# Patient Record
Sex: Female | Born: 1955 | Race: Black or African American | Hispanic: No | Marital: Single | Smoking: Never smoker
Health system: Southern US, Community
[De-identification: ages and names within clinical notes are randomized; demographics above are authoritative.]

## PROBLEM LIST (undated history)

## (undated) DIAGNOSIS — C50919 Malignant neoplasm of unspecified site of unspecified female breast: Secondary | ICD-10-CM

## (undated) DIAGNOSIS — E538 Deficiency of other specified B group vitamins: Secondary | ICD-10-CM

## (undated) DIAGNOSIS — G43909 Migraine, unspecified, not intractable, without status migrainosus: Secondary | ICD-10-CM

## (undated) DIAGNOSIS — Z923 Personal history of irradiation: Secondary | ICD-10-CM

## (undated) DIAGNOSIS — M1990 Unspecified osteoarthritis, unspecified site: Secondary | ICD-10-CM

## (undated) DIAGNOSIS — G629 Polyneuropathy, unspecified: Secondary | ICD-10-CM

## (undated) DIAGNOSIS — M549 Dorsalgia, unspecified: Secondary | ICD-10-CM

## (undated) DIAGNOSIS — M8580 Other specified disorders of bone density and structure, unspecified site: Secondary | ICD-10-CM

## (undated) DIAGNOSIS — B009 Herpesviral infection, unspecified: Secondary | ICD-10-CM

## (undated) DIAGNOSIS — R202 Paresthesia of skin: Secondary | ICD-10-CM

## (undated) DIAGNOSIS — K219 Gastro-esophageal reflux disease without esophagitis: Secondary | ICD-10-CM

## (undated) DIAGNOSIS — H409 Unspecified glaucoma: Secondary | ICD-10-CM

## (undated) DIAGNOSIS — L309 Dermatitis, unspecified: Secondary | ICD-10-CM

## (undated) DIAGNOSIS — G8929 Other chronic pain: Secondary | ICD-10-CM

## (undated) HISTORY — PX: CYSTOSCOPY: SUR368

## (undated) HISTORY — DX: Paresthesia of skin: R20.2

## (undated) HISTORY — PX: TUBAL LIGATION: SHX77

## (undated) HISTORY — DX: Migraine, unspecified, not intractable, without status migrainosus: G43.909

## (undated) HISTORY — PX: DILATION AND CURETTAGE OF UTERUS: SHX78

## (undated) HISTORY — DX: Deficiency of other specified B group vitamins: E53.8

## (undated) HISTORY — PX: ABDOMINAL HYSTERECTOMY: SHX81

## (undated) HISTORY — DX: Unspecified glaucoma: H40.9

## (undated) HISTORY — PX: SHOULDER ARTHROSCOPY WITH ROTATOR CUFF REPAIR: SHX5685

---
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
## 2008-08-09 HISTORY — PX: COLONOSCOPY: SHX174

## 2008-08-28 ENCOUNTER — Encounter: Admission: RE | Admit: 2008-08-28 | Discharge: 2008-11-26 | Payer: Self-pay | Admitting: Obstetrics and Gynecology

## 2010-07-27 ENCOUNTER — Emergency Department (HOSPITAL_BASED_OUTPATIENT_CLINIC_OR_DEPARTMENT_OTHER)
Admission: EM | Admit: 2010-07-27 | Discharge: 2010-07-27 | Payer: Self-pay | Source: Home / Self Care | Admitting: Emergency Medicine

## 2010-10-19 LAB — TSH: TSH: 3.396 u[IU]/mL (ref 0.350–4.500)

## 2010-10-21 ENCOUNTER — Ambulatory Visit: Payer: BC Managed Care – PPO | Attending: Obstetrics and Gynecology | Admitting: Physical Therapy

## 2010-10-21 DIAGNOSIS — M25559 Pain in unspecified hip: Secondary | ICD-10-CM | POA: Insufficient documentation

## 2010-10-21 DIAGNOSIS — M6281 Muscle weakness (generalized): Secondary | ICD-10-CM | POA: Insufficient documentation

## 2010-10-21 DIAGNOSIS — IMO0001 Reserved for inherently not codable concepts without codable children: Secondary | ICD-10-CM | POA: Insufficient documentation

## 2010-10-21 DIAGNOSIS — M545 Low back pain, unspecified: Secondary | ICD-10-CM | POA: Insufficient documentation

## 2010-10-21 DIAGNOSIS — R262 Difficulty in walking, not elsewhere classified: Secondary | ICD-10-CM | POA: Insufficient documentation

## 2010-10-21 DIAGNOSIS — R293 Abnormal posture: Secondary | ICD-10-CM | POA: Insufficient documentation

## 2010-10-23 ENCOUNTER — Ambulatory Visit: Payer: BC Managed Care – PPO | Admitting: Physical Therapy

## 2010-10-30 ENCOUNTER — Ambulatory Visit: Payer: BC Managed Care – PPO | Admitting: Physical Therapy

## 2010-11-24 ENCOUNTER — Other Ambulatory Visit: Payer: Self-pay | Admitting: Family Medicine

## 2010-11-24 DIAGNOSIS — R110 Nausea: Secondary | ICD-10-CM

## 2010-11-24 DIAGNOSIS — H53149 Visual discomfort, unspecified: Secondary | ICD-10-CM

## 2010-11-24 DIAGNOSIS — R51 Headache: Secondary | ICD-10-CM

## 2010-11-26 ENCOUNTER — Ambulatory Visit
Admission: RE | Admit: 2010-11-26 | Discharge: 2010-11-26 | Disposition: A | Discharge status: Home / Self Care | Payer: BC Managed Care – PPO | Source: Ambulatory Visit | Attending: Family Medicine | Admitting: Family Medicine

## 2010-11-26 DIAGNOSIS — R51 Headache: Secondary | ICD-10-CM

## 2010-11-26 DIAGNOSIS — R110 Nausea: Secondary | ICD-10-CM

## 2010-11-26 DIAGNOSIS — H53149 Visual discomfort, unspecified: Secondary | ICD-10-CM

## 2010-11-27 ENCOUNTER — Other Ambulatory Visit: Payer: Self-pay | Admitting: Family Medicine

## 2010-11-27 DIAGNOSIS — R110 Nausea: Secondary | ICD-10-CM

## 2010-11-27 DIAGNOSIS — R51 Headache: Secondary | ICD-10-CM

## 2010-12-02 ENCOUNTER — Ambulatory Visit
Admission: RE | Admit: 2010-12-02 | Discharge: 2010-12-02 | Disposition: A | Discharge status: Home / Self Care | Payer: BC Managed Care – PPO | Source: Ambulatory Visit | Attending: Family Medicine | Admitting: Family Medicine

## 2010-12-02 DIAGNOSIS — R110 Nausea: Secondary | ICD-10-CM

## 2010-12-02 DIAGNOSIS — R51 Headache: Secondary | ICD-10-CM

## 2011-04-14 ENCOUNTER — Ambulatory Visit: Payer: Self-pay | Admitting: Physical Therapy

## 2011-07-12 ENCOUNTER — Other Ambulatory Visit: Payer: Self-pay

## 2011-07-12 ENCOUNTER — Encounter (HOSPITAL_COMMUNITY)
Admission: RE | Admit: 2011-07-12 | Discharge: 2011-07-12 | Disposition: A | Discharge status: Home / Self Care | Payer: BC Managed Care – PPO | Source: Ambulatory Visit | Attending: Orthopedic Surgery | Admitting: Orthopedic Surgery

## 2011-07-12 ENCOUNTER — Ambulatory Visit (HOSPITAL_COMMUNITY)
Admission: RE | Admit: 2011-07-12 | Discharge: 2011-07-12 | Disposition: A | Discharge status: Home / Self Care | Payer: BC Managed Care – PPO | Source: Ambulatory Visit | Attending: Anesthesiology | Admitting: Anesthesiology

## 2011-07-12 ENCOUNTER — Encounter (HOSPITAL_COMMUNITY): Payer: Self-pay

## 2011-07-12 DIAGNOSIS — Z01812 Encounter for preprocedural laboratory examination: Secondary | ICD-10-CM | POA: Insufficient documentation

## 2011-07-12 DIAGNOSIS — Z01810 Encounter for preprocedural cardiovascular examination: Secondary | ICD-10-CM | POA: Insufficient documentation

## 2011-07-12 DIAGNOSIS — Z01818 Encounter for other preprocedural examination: Secondary | ICD-10-CM | POA: Insufficient documentation

## 2011-07-12 HISTORY — DX: Polyneuropathy, unspecified: G62.9

## 2011-07-12 HISTORY — DX: Dermatitis, unspecified: L30.9

## 2011-07-12 HISTORY — DX: Other chronic pain: G89.29

## 2011-07-12 HISTORY — DX: Other specified disorders of bone density and structure, unspecified site: M85.80

## 2011-07-12 HISTORY — DX: Dorsalgia, unspecified: M54.9

## 2011-07-12 HISTORY — DX: Gastro-esophageal reflux disease without esophagitis: K21.9

## 2011-07-12 HISTORY — DX: Unspecified osteoarthritis, unspecified site: M19.90

## 2011-07-12 LAB — BASIC METABOLIC PANEL
BUN: 13 mg/dL (ref 6–23)
CO2: 31 mEq/L (ref 19–32)
Calcium: 9.4 mg/dL (ref 8.4–10.5)
Chloride: 103 mEq/L (ref 96–112)
Creatinine, Ser: 0.67 mg/dL (ref 0.50–1.10)
GFR calc Af Amer: >90 mL/min (ref >90)
GFR calc non Af Amer: >90 mL/min (ref >90)
Glucose, Bld: 92 mg/dL (ref 70–99)
Potassium: 4 mEq/L (ref 3.5–5.1)
Sodium: 142 mEq/L (ref 135–145)

## 2011-07-12 LAB — DIFFERENTIAL
Basophils Relative: 1 % (ref 0–1)
Eosinophils Absolute: 0.5 10*3/uL (ref 0.0–0.7)
Eosinophils Relative: 5 % (ref 0–5)
Lymphs Abs: 3.2 10*3/uL (ref 0.7–4.0)
Monocytes Absolute: 0.6 10*3/uL (ref 0.1–1.0)
Monocytes Relative: 7 % (ref 3–12)

## 2011-07-12 LAB — CBC
HCT: 38.8 % (ref 36.0–46.0)
Hemoglobin: 13.2 g/dL (ref 12.0–15.0)
MCH: 32.6 pg (ref 26.0–34.0)
MCHC: 34 g/dL (ref 30.0–36.0)
MCV: 95.8 fL (ref 78.0–100.0)
RBC: 4.05 MIL/uL (ref 3.87–5.11)

## 2011-07-12 LAB — PROTIME-INR
INR: 0.96 (ref 0.00–1.49)
Prothrombin Time: 13 seconds (ref 11.6–15.2)

## 2011-07-12 LAB — APTT: aPTT: 33 seconds (ref 24–37)

## 2011-07-12 NOTE — Progress Notes (Signed)
Stress test done 3+yrs ago in [facility] for routine for medical MD  Medical MD is Dr.Novi at [facility]

## 2011-07-12 NOTE — Pre-Procedure Instructions (Signed)
20 Noela Brothers  07/12/2011   Your procedure is scheduled on:  Fri,Dec 14th @ 0730  Report to [facility] at 0530 AM.  Call this number if you have problems the morning of surgery: [phone]   Remember:   Do not eat food:After Midnight.  May have clear liquids: up to 4 Hours before arrival.  Clear liquids include soda, tea, black coffee, apple or grape juice, broth.  Take these medicines the morning of surgery with A SIP OF WATER: Acyclovir and Gabapentin   Do not wear jewelry, make-up or nail polish.  Do not wear lotions, powders, or perfumes. You may wear deodorant.  Do not shave 48 hours prior to surgery.  Do not bring valuables to the hospital.  Contacts, dentures or bridgework may not be worn into surgery.  Leave suitcase in the car. After surgery it may be brought to your room.  For patients admitted to the hospital, checkout time is 11:00 AM the day of discharge.   Patients discharged the day of surgery will not be allowed to drive home.  Name and phone number of your driver:   Special Instructions: CHG Shower Use Special Wash: 1/2 bottle night before surgery and 1/2 bottle morning of surgery.   Please read over the following fact sheets that you were given: Pain Booklet, Coughing and Deep Breathing, MRSA Information and Surgical Site Infection Prevention

## 2011-07-12 NOTE — Progress Notes (Signed)
Pt states no high blood pressure;she is on Lisinopril d/t being a diabetic and to guard against kidney disease

## 2011-07-13 NOTE — H&P (Signed)
55 y/o female with worsening right shoulder pain secondary to a rotator cuff tear and AC arthrosis. Pt has elected to have a rotator cuff repair and open distal clavicle excision by Dr. Ranell Patrick to decrease pain and increase function PMH: Hypertension, diabetes, GERD, peripheral neuropathy Surgical: hysterectomy Meds: metformin, gabapentin, ramipril, vitamin d, fish oil, flax seed  ROS: right shoulder pain and weakness otherwise negative Allergy: NKDA Family: Diabetes, hypertension Social: smoker, no alcohol,  PE: 5'3 141lb   108/78 Healthy appearing 55 year old female in no acute distress. Full rom of cervical spine with cranial nerves intact. Right shoulder with moderate guarding due to pain with limited rom, nv intact distally, strength of external rotation is 4/5. No rashes or edema MRI: rotator cuff tear of the right shoulder with AC arthrosis A/P: plan for surgery for rotator cuff repair and open distal clavicle excision

## 2011-07-22 MED ORDER — CEFAZOLIN SODIUM 1-5 GM-% IV SOLN
1.0000 g | INTRAVENOUS | Status: AC
  Administered 2011-07-23: 1 g via INTRAVENOUS
  Filled 2011-07-22: qty 50

## 2011-07-23 ENCOUNTER — Encounter (HOSPITAL_COMMUNITY): Payer: Self-pay | Admitting: *Deleted

## 2011-07-23 ENCOUNTER — Encounter (HOSPITAL_COMMUNITY): Payer: Self-pay | Admitting: Anesthesiology

## 2011-07-23 ENCOUNTER — Ambulatory Visit (HOSPITAL_COMMUNITY)
Admission: RE | Admit: 2011-07-23 | Discharge: 2011-07-24 | Disposition: A | Discharge status: Home / Self Care | Payer: BC Managed Care – PPO | Source: Ambulatory Visit | Attending: Orthopedic Surgery | Admitting: Orthopedic Surgery

## 2011-07-23 ENCOUNTER — Encounter (HOSPITAL_COMMUNITY)
Admission: RE | Disposition: A | Discharge status: Home / Self Care | Payer: Self-pay | Source: Ambulatory Visit | Attending: Orthopedic Surgery

## 2011-07-23 ENCOUNTER — Inpatient Hospital Stay (HOSPITAL_COMMUNITY): Payer: BC Managed Care – PPO | Admitting: Anesthesiology

## 2011-07-23 ENCOUNTER — Encounter (HOSPITAL_COMMUNITY): Payer: Self-pay | Admitting: Orthopedic Surgery

## 2011-07-23 DIAGNOSIS — E119 Type 2 diabetes mellitus without complications: Secondary | ICD-10-CM | POA: Insufficient documentation

## 2011-07-23 DIAGNOSIS — Z5333 Arthroscopic surgical procedure converted to open procedure: Secondary | ICD-10-CM | POA: Insufficient documentation

## 2011-07-23 DIAGNOSIS — I10 Essential (primary) hypertension: Secondary | ICD-10-CM | POA: Insufficient documentation

## 2011-07-23 DIAGNOSIS — M67919 Unspecified disorder of synovium and tendon, unspecified shoulder: Principal | ICD-10-CM | POA: Insufficient documentation

## 2011-07-23 DIAGNOSIS — K219 Gastro-esophageal reflux disease without esophagitis: Secondary | ICD-10-CM | POA: Insufficient documentation

## 2011-07-23 DIAGNOSIS — M719 Bursopathy, unspecified: Principal | ICD-10-CM | POA: Insufficient documentation

## 2011-07-23 DIAGNOSIS — S43429A Sprain of unspecified rotator cuff capsule, initial encounter: Secondary | ICD-10-CM | POA: Diagnosis present

## 2011-07-23 DIAGNOSIS — M19019 Primary osteoarthritis, unspecified shoulder: Secondary | ICD-10-CM | POA: Insufficient documentation

## 2011-07-23 LAB — GLUCOSE, CAPILLARY
Glucose-Capillary: 183 mg/dL — ABNORMAL HIGH (ref 70–99)
Glucose-Capillary: 249 mg/dL — ABNORMAL HIGH (ref 70–99)
Glucose-Capillary: 322 mg/dL — ABNORMAL HIGH (ref 70–99)
Glucose-Capillary: 190 mg/dL — ABNORMAL HIGH (ref 70–99)
Glucose-Capillary: 194 mg/dL — ABNORMAL HIGH (ref 70–99)

## 2011-07-23 SURGERY — SHOULDER ARTHROSCOPY WITH OPEN ROTATOR CUFF REPAIR AND DISTAL CLAVICLE ACROMINECTOMY
Anesthesia: General | Site: Shoulder | Laterality: Right | Wound class: Clean

## 2011-07-23 MED ORDER — METHOCARBAMOL 100 MG/ML IJ SOLN
500.0000 mg | Freq: Four times a day (QID) | INTRAMUSCULAR | Status: DC | PRN
  Filled 2011-07-23: qty 5

## 2011-07-23 MED ORDER — LISINOPRIL 2.5 MG PO TABS
2.5000 mg | ORAL_TABLET | Freq: Every day | ORAL | Status: DC
  Administered 2011-07-23 – 2011-07-24 (×2): 2.5 mg via ORAL
  Filled 2011-07-23 (×2): qty 1

## 2011-07-23 MED ORDER — OXYCODONE-ACETAMINOPHEN 5-325 MG PO TABS
1.0000 | ORAL_TABLET | ORAL | Status: DC | PRN
  Administered 2011-07-23 – 2011-07-24 (×3): 1 via ORAL
  Administered 2011-07-24: 2 via ORAL
  Filled 2011-07-23: qty 1
  Filled 2011-07-23: qty 2
  Filled 2011-07-23 (×2): qty 1

## 2011-07-23 MED ORDER — PNEUMOCOCCAL VAC POLYVALENT 25 MCG/0.5ML IJ INJ
0.5000 mL | INJECTION | INTRAMUSCULAR | Status: AC
  Administered 2011-07-24: 0.5 mL via INTRAMUSCULAR
  Filled 2011-07-23: qty 0.5

## 2011-07-23 MED ORDER — INSULIN ASPART 100 UNIT/ML ~~LOC~~ SOLN
0.0000 [IU] | Freq: Three times a day (TID) | SUBCUTANEOUS | Status: DC
  Administered 2011-07-23: 3 [IU] via SUBCUTANEOUS
  Administered 2011-07-24: 5 [IU] via SUBCUTANEOUS
  Filled 2011-07-23: qty 3

## 2011-07-23 MED ORDER — ONDANSETRON HCL 4 MG/2ML IJ SOLN
INTRAMUSCULAR | Status: DC | PRN
  Administered 2011-07-23: 4 mg via INTRAVENOUS

## 2011-07-23 MED ORDER — PHENYLEPHRINE HCL 10 MG/ML IJ SOLN
INTRAMUSCULAR | Status: DC | PRN
  Administered 2011-07-23: 80 ug via INTRAVENOUS

## 2011-07-23 MED ORDER — MIDAZOLAM HCL 5 MG/5ML IJ SOLN
INTRAMUSCULAR | Status: DC | PRN
  Administered 2011-07-23: 1 mg via INTRAVENOUS

## 2011-07-23 MED ORDER — METHOCARBAMOL 500 MG PO TABS
500.0000 mg | ORAL_TABLET | Freq: Four times a day (QID) | ORAL | Status: DC | PRN
  Administered 2011-07-23 – 2011-07-24 (×3): 500 mg via ORAL
  Filled 2011-07-23 (×3): qty 1

## 2011-07-23 MED ORDER — ONDANSETRON HCL 4 MG/2ML IJ SOLN: 4.0000 mg | Freq: Four times a day (QID) | INTRAMUSCULAR | Status: DC | PRN

## 2011-07-23 MED ORDER — ACETAMINOPHEN 325 MG PO TABS: 650.0000 mg | ORAL_TABLET | Freq: Four times a day (QID) | ORAL | Status: DC | PRN

## 2011-07-23 MED ORDER — INSULIN ASPART 100 UNIT/ML ~~LOC~~ SOLN
4.0000 [IU] | Freq: Three times a day (TID) | SUBCUTANEOUS | Status: DC
  Administered 2011-07-23 – 2011-07-24 (×2): 4 [IU] via SUBCUTANEOUS
  Filled 2011-07-23 (×2): qty 3

## 2011-07-23 MED ORDER — BUPIVACAINE-EPINEPHRINE 0.25% -1:200000 IJ SOLN
INTRAMUSCULAR | Status: DC | PRN
  Administered 2011-07-23: 4.5 mL

## 2011-07-23 MED ORDER — HYDROMORPHONE HCL PF 1 MG/ML IJ SOLN
0.5000 mg | INTRAMUSCULAR | Status: DC | PRN
  Administered 2011-07-24 (×3): 1 mg via INTRAVENOUS
  Filled 2011-07-23 (×3): qty 1

## 2011-07-23 MED ORDER — ROCURONIUM BROMIDE 100 MG/10ML IV SOLN
INTRAVENOUS | Status: DC | PRN
  Administered 2011-07-23: 35 mg via INTRAVENOUS

## 2011-07-23 MED ORDER — LACTATED RINGERS IV SOLN
Freq: Once | INTRAVENOUS | Status: AC
  Administered 2011-07-23: 12:00:00 via INTRAVENOUS

## 2011-07-23 MED ORDER — ACYCLOVIR 200 MG PO CAPS
400.0000 mg | ORAL_CAPSULE | Freq: Two times a day (BID) | ORAL | Status: DC
  Administered 2011-07-24: 400 mg via ORAL
  Filled 2011-07-23 (×4): qty 2

## 2011-07-23 MED ORDER — LACTATED RINGERS IV SOLN
INTRAVENOUS | Status: DC | PRN
  Administered 2011-07-23: 07:00:00 via INTRAVENOUS

## 2011-07-23 MED ORDER — METHOCARBAMOL 500 MG PO TABS: 500.0000 mg | ORAL_TABLET | Freq: Three times a day (TID) | ORAL | Status: AC | PRN

## 2011-07-23 MED ORDER — SODIUM CHLORIDE 0.9 % IR SOLN
Status: DC | PRN
  Administered 2011-07-23: 3000 mL

## 2011-07-23 MED ORDER — METOCLOPRAMIDE HCL 10 MG PO TABS: 5.0000 mg | ORAL_TABLET | Freq: Three times a day (TID) | ORAL | Status: DC | PRN

## 2011-07-23 MED ORDER — MENTHOL 3 MG MT LOZG: 1.0000 | LOZENGE | OROMUCOSAL | Status: DC | PRN

## 2011-07-23 MED ORDER — HYDROMORPHONE HCL PF 1 MG/ML IJ SOLN: 0.2500 mg | INTRAMUSCULAR | Status: DC | PRN

## 2011-07-23 MED ORDER — GABAPENTIN 300 MG PO CAPS
300.0000 mg | ORAL_CAPSULE | Freq: Every day | ORAL | Status: DC
  Administered 2011-07-23 – 2011-07-24 (×2): 300 mg via ORAL
  Filled 2011-07-23 (×2): qty 1

## 2011-07-23 MED ORDER — SODIUM CHLORIDE 0.9 % IR SOLN
Status: DC | PRN
  Administered 2011-07-23: 1000 mL

## 2011-07-23 MED ORDER — LACTATED RINGERS IV SOLN
INTRAVENOUS | Status: DC
  Administered 2011-07-23: 12:00:00 via INTRAVENOUS

## 2011-07-23 MED ORDER — SODIUM CHLORIDE 0.9 % IV SOLN
10.0000 mg | INTRAVENOUS | Status: DC | PRN
  Administered 2011-07-23: 10 ug/min via INTRAVENOUS

## 2011-07-23 MED ORDER — NEOSTIGMINE METHYLSULFATE 1 MG/ML IJ SOLN
INTRAMUSCULAR | Status: DC | PRN
  Administered 2011-07-23: 3 mg via INTRAVENOUS

## 2011-07-23 MED ORDER — INSULIN GLARGINE 100 UNIT/ML ~~LOC~~ SOLN
35.0000 [IU] | Freq: Every day | SUBCUTANEOUS | Status: DC
  Administered 2011-07-23: 35 [IU] via SUBCUTANEOUS
  Filled 2011-07-23 (×2): qty 3

## 2011-07-23 MED ORDER — BUPIVACAINE-EPINEPHRINE PF 0.5-1:200000 % IJ SOLN
INTRAMUSCULAR | Status: DC | PRN
  Administered 2011-07-23: 30 mL

## 2011-07-23 MED ORDER — PHENOL 1.4 % MT LIQD
1.0000 | OROMUCOSAL | Status: DC | PRN
  Filled 2011-07-23: qty 177

## 2011-07-23 MED ORDER — ONDANSETRON HCL 4 MG PO TABS: 4.0000 mg | ORAL_TABLET | Freq: Four times a day (QID) | ORAL | Status: DC | PRN

## 2011-07-23 MED ORDER — CEFAZOLIN SODIUM 1-5 GM-% IV SOLN
1.0000 g | Freq: Four times a day (QID) | INTRAVENOUS | Status: AC
  Administered 2011-07-23 – 2011-07-24 (×3): 1 g via INTRAVENOUS
  Filled 2011-07-23 (×3): qty 50

## 2011-07-23 MED ORDER — GLYCOPYRROLATE 0.2 MG/ML IJ SOLN
INTRAMUSCULAR | Status: DC | PRN
  Administered 2011-07-23: .4 mg via INTRAVENOUS

## 2011-07-23 MED ORDER — OXYCODONE-ACETAMINOPHEN 5-325 MG PO TABS: 1.0000 | ORAL_TABLET | ORAL | Status: DC | PRN

## 2011-07-23 MED ORDER — POTASSIUM CHLORIDE IN NACL 20-0.9 MEQ/L-% IV SOLN
INTRAVENOUS | Status: DC
  Administered 2011-07-23: 16:00:00 via INTRAVENOUS
  Filled 2011-07-23 (×4): qty 1000

## 2011-07-23 MED ORDER — CHLORHEXIDINE GLUCONATE 4 % EX LIQD: 60.0000 mL | Freq: Once | CUTANEOUS | Status: DC

## 2011-07-23 MED ORDER — ACYCLOVIR 400 MG PO TABS
400.0000 mg | ORAL_TABLET | Freq: Two times a day (BID) | ORAL | Status: DC
  Filled 2011-07-23: qty 1

## 2011-07-23 MED ORDER — ACETAMINOPHEN 650 MG RE SUPP: 650.0000 mg | Freq: Four times a day (QID) | RECTAL | Status: DC | PRN

## 2011-07-23 MED ORDER — PROMETHAZINE HCL 25 MG/ML IJ SOLN
6.2500 mg | Freq: Once | INTRAMUSCULAR | Status: DC
Start: 2011-07-23 — End: 2011-07-23

## 2011-07-23 MED ORDER — PROPOFOL 10 MG/ML IV EMUL
INTRAVENOUS | Status: DC | PRN
  Administered 2011-07-23: 100 mg via INTRAVENOUS

## 2011-07-23 MED ORDER — PROMETHAZINE HCL 25 MG/ML IJ SOLN
6.2500 mg | INTRAMUSCULAR | Status: DC | PRN
  Filled 2011-07-23: qty 1

## 2011-07-23 MED ORDER — METFORMIN HCL 500 MG PO TABS
500.0000 mg | ORAL_TABLET | Freq: Every day | ORAL | Status: DC
  Administered 2011-07-23 – 2011-07-24 (×2): 500 mg via ORAL
  Filled 2011-07-23 (×2): qty 1

## 2011-07-23 MED ORDER — METOCLOPRAMIDE HCL 5 MG/ML IJ SOLN
5.0000 mg | Freq: Three times a day (TID) | INTRAMUSCULAR | Status: DC | PRN
  Filled 2011-07-23: qty 2

## 2011-07-23 MED ORDER — FENTANYL CITRATE 0.05 MG/ML IJ SOLN
INTRAMUSCULAR | Status: DC | PRN
  Administered 2011-07-23: 100 ug via INTRAVENOUS
  Administered 2011-07-23 (×2): 50 ug via INTRAVENOUS

## 2011-07-23 SURGICAL SUPPLY — 65 items
ANCHOR JUGGERKNOT SZ1 (Anchor) ×2 IMPLANT
ANCHOR SUT CROSSFT 4.5 #2 (Anchor) ×2 IMPLANT
BLADE LONG MED 31X9 (MISCELLANEOUS) ×2 IMPLANT
BLADE SURG 11 STRL SS (BLADE) ×2 IMPLANT
BUR OVAL 4.0 (BURR) IMPLANT
CLOSURE STERI STRIP 1/2 X4 (GAUZE/BANDAGES/DRESSINGS) ×2 IMPLANT
CLOTH BEACON ORANGE TIMEOUT ST (SAFETY) ×2 IMPLANT
COVER SURGICAL LIGHT HANDLE (MISCELLANEOUS) ×2 IMPLANT
DRAPE INCISE IOBAN 66X45 STRL (DRAPES) ×2 IMPLANT
DRAPE STERI 35X30 U-POUCH (DRAPES) ×2 IMPLANT
DRAPE U-SHAPE 47X51 STRL (DRAPES) ×2 IMPLANT
DRILL BIT 5/64 (BIT) ×2 IMPLANT
DRSG ADAPTIC 3X8 NADH LF (GAUZE/BANDAGES/DRESSINGS) ×2 IMPLANT
DRSG EMULSION OIL 3X3 NADH (GAUZE/BANDAGES/DRESSINGS) ×4 IMPLANT
DRSG PAD ABDOMINAL 8X10 ST (GAUZE/BANDAGES/DRESSINGS) ×2 IMPLANT
DURAPREP 26ML APPLICATOR (WOUND CARE) ×2 IMPLANT
ELECT NEEDLE TIP 2.8 STRL (NEEDLE) ×2 IMPLANT
ELECT REM PT RETURN 9FT ADLT (ELECTROSURGICAL)
ELECTRODE REM PT RTRN 9FT ADLT (ELECTROSURGICAL) IMPLANT
GLOVE BIOGEL PI ORTHO PRO 7.5 (GLOVE) ×1
GLOVE BIOGEL PI ORTHO PRO SZ8 (GLOVE) ×1
GLOVE ORTHO TXT STRL SZ7.5 (GLOVE) ×2 IMPLANT
GLOVE PI ORTHO PRO STRL 7.5 (GLOVE) ×1 IMPLANT
GLOVE PI ORTHO PRO STRL SZ8 (GLOVE) ×1 IMPLANT
GLOVE SURG ORTHO 8.5 STRL (GLOVE) ×2 IMPLANT
GOWN STRL NON-REIN LRG LVL3 (GOWN DISPOSABLE) IMPLANT
KIT BASIN OR (CUSTOM PROCEDURE TRAY) ×2 IMPLANT
KIT JUGGERKNOT DISP 2.9MM (KITS) IMPLANT
KIT ROOM TURNOVER OR (KITS) ×2 IMPLANT
MANIFOLD NEPTUNE II (INSTRUMENTS) ×2 IMPLANT
NDL SUT 6 .5 CRC .975X.05 MAYO (NEEDLE) IMPLANT
NEEDLE HYPO 25GX1X1/2 BEV (NEEDLE) ×2 IMPLANT
NEEDLE MAYO TAPER (NEEDLE)
NEEDLE SPNL 18GX3.5 QUINCKE PK (NEEDLE) ×2 IMPLANT
NS IRRIG 1000ML POUR BTL (IV SOLUTION) ×2 IMPLANT
PACK SHOULDER (CUSTOM PROCEDURE TRAY) ×2 IMPLANT
PAD ARMBOARD 7.5X6 YLW CONV (MISCELLANEOUS) ×4 IMPLANT
RESECTOR FULL RADIUS 4.2MM (BLADE) IMPLANT
SET ARTHROSCOPY TUBING (MISCELLANEOUS) ×1
SET ARTHROSCOPY TUBING LN (MISCELLANEOUS) ×1 IMPLANT
SET JUGGERKNOT DISP 1.4MM ×2 IMPLANT
SLING ARM FOAM STRAP LRG (SOFTGOODS) ×2 IMPLANT
SLING ARM FOAM STRAP MED (SOFTGOODS) IMPLANT
SPONGE GAUZE 4X4 12PLY (GAUZE/BANDAGES/DRESSINGS) ×2 IMPLANT
SPONGE LAP 4X18 X RAY DECT (DISPOSABLE) ×2 IMPLANT
STRIP CLOSURE SKIN 1/2X4 (GAUZE/BANDAGES/DRESSINGS) ×2 IMPLANT
SUCTION FRAZIER TIP 10 FR DISP (SUCTIONS) ×2 IMPLANT
SUT BONE WAX W31G (SUTURE) ×2 IMPLANT
SUT FIBERWIRE #2 38 T-5 BLUE (SUTURE)
SUT MNCRL AB 4-0 PS2 18 (SUTURE) ×2 IMPLANT
SUT VIC AB 0 CT1 27 (SUTURE)
SUT VIC AB 0 CT1 27XBRD ANBCTR (SUTURE) IMPLANT
SUT VIC AB 0 CT2 27 (SUTURE) ×2 IMPLANT
SUT VIC AB 2-0 CT1 27 (SUTURE)
SUT VIC AB 2-0 CT1 TAPERPNT 27 (SUTURE) IMPLANT
SUT VICRYL 0 CT 1 36IN (SUTURE) ×6 IMPLANT
SUTURE FIBERWR #2 38 T-5 BLUE (SUTURE) IMPLANT
SYR CONTROL 10ML LL (SYRINGE) ×2 IMPLANT
TAPE CLOTH SURG 4X10 WHT LF (GAUZE/BANDAGES/DRESSINGS) ×2 IMPLANT
TOWEL OR 17X24 6PK STRL BLUE (TOWEL DISPOSABLE) ×2 IMPLANT
TOWEL OR 17X26 10 PK STRL BLUE (TOWEL DISPOSABLE) ×2 IMPLANT
TUBE CONNECTING 12X1/4 (SUCTIONS) ×2 IMPLANT
WAND 90 DEG TURBOVAC W/CORD (SURGICAL WAND) ×2 IMPLANT
WATER STERILE IRR 1000ML POUR (IV SOLUTION) ×2 IMPLANT
poplok suture anchor 3.5mm ×2 IMPLANT

## 2011-07-23 NOTE — Anesthesia Preprocedure Evaluation (Addendum)
Anesthesia Evaluation  Patient identified by MRN, date of birth, ID band Patient awake    Reviewed: Allergy & Precautions, H&P , NPO status , Patient's Chart, lab work & pertinent test results  Airway Mallampati: I  Neck ROM: Full    Dental  (+) Teeth Intact   Pulmonary          Cardiovascular     Neuro/Psych    GI/Hepatic GERD-  ,  Endo/Other  Diabetes mellitus-, Poorly Controlled, Type 2, Insulin Dependent  Renal/GU      Musculoskeletal  (+) Arthritis -, Osteoarthritis,    Abdominal   Peds  Hematology   Anesthesia Other Findings   Reproductive/Obstetrics                          Anesthesia Physical Anesthesia Plan  ASA: II  Anesthesia Plan: General   Post-op Pain Management: MAC Combined w/ Regional for Post-op pain   Induction: Intravenous  Airway Management Planned: Oral ETT  Additional Equipment:   Intra-op Plan:   Post-operative Plan: Extubation in OR  Informed Consent: I have reviewed the patients History and Physical, chart, labs and discussed the procedure including the risks, benefits and alternatives for the proposed anesthesia with the patient or authorized representative who has indicated his/her understanding and acceptance.   Dental advisory given  Plan Discussed with:   Anesthesia Plan Comments:         Anesthesia Quick Evaluation

## 2011-07-23 NOTE — Discharge Summary (Signed)
Physician Discharge Summary  Patient ID: Breanna Pratt MRN: [id number] DOB/AGE: Oct 24, 1955 56 y.o.  Admit date: 07/23/2011 Discharge date: 07/24/2011  Admission Diagnoses:  Active Problems:  Rotator cuff (capsule) sprain   Discharge Diagnoses:  Same   Surgeries: Procedure(s): SHOULDER ARTHROSCOPY WITH OPEN ROTATOR CUFF REPAIR AND DISTAL CLAVICLE ACROMINECTOMY on 07/23/2011   Consultants: PT/OT  Discharged Condition: Stable  Hospital Course: Breanna Pratt is an 55 y.o. female who was admitted 07/23/2011 with a chief complaint of No chief complaint on file. , and found to have a diagnosis of <principal problem not specified>.  They were brought to the operating room on 07/23/2011 and underwent the above named procedures.    The patient had an uncomplicated hospital course and was stable for discharge.  Recent vital signs:  Filed Vitals:   07/23/11 1300  BP: 114/63  Pulse: 60  Temp: 98.6 F (37 C)  Resp: 18    Recent laboratory studies:  Results for orders placed during the hospital encounter of 07/23/11  GLUCOSE, CAPILLARY      Component Value Range   Glucose-Capillary 183 (*) 70 - 99 (mg/dL)  GLUCOSE, CAPILLARY      Component Value Range   Glucose-Capillary 249 (*) 70 - 99 (mg/dL)   Comment 1 Documented in Chart     Comment 2 Notify RN    GLUCOSE, CAPILLARY      Component Value Range   Glucose-Capillary 238 (*) 70 - 99 (mg/dL)  GLUCOSE, CAPILLARY      Component Value Range   Glucose-Capillary 322 (*) 70 - 99 (mg/dL)  GLUCOSE, CAPILLARY      Component Value Range   Glucose-Capillary 190 (*) 70 - 99 (mg/dL)   Comment 1 Documented in Chart     Comment 2 Notify RN      Discharge Medications:   Current Discharge Medication List    START taking these medications   Details  methocarbamol (ROBAXIN) 500 MG tablet Take 1 tablet (500 mg total) by mouth 3 (three) times daily as needed. Qty: 60 tablet, Refills: 1    oxyCODONE-acetaminophen (ROXICET) 5-325 MG per  tablet Take 1-2 tablets by mouth every 4 (four) hours as needed for pain. Qty: 60 tablet, Refills: 0      CONTINUE these medications which have NOT CHANGED   Details  acyclovir (ZOVIRAX) 400 MG tablet Take 400 mg by mouth 2 (two) times daily.      gabapentin (NEURONTIN) 300 MG capsule Take 300 mg by mouth daily.      insulin glargine (LANTUS) 100 UNIT/ML injection Inject 35 Units into the skin at bedtime.      insulin lispro (HUMALOG) 100 UNIT/ML injection Inject 8 Units into the skin 3 (three) times daily before meals. 8units in am 12units at lunch 15units at supper     lisinopril (PRINIVIL,ZESTRIL) 2.5 MG tablet Take 2.5 mg by mouth daily.      metFORMIN (GLUCOPHAGE) 500 MG tablet Take 500 mg by mouth daily.          Diagnostic Studies: Dg Chest 2 View  07/12/2011  *RADIOLOGY REPORT*  Clinical Data: Preop for right shoulder arthroscopy  CHEST - 2 VIEW  Comparison: None.  Findings: Cardiomediastinal silhouette is unremarkable.  No acute infiltrate or pleural effusion.  No pulmonary edema.  Mild degenerative changes thoracic spine.  IMPRESSION: No active disease.  Original Report Authenticated By: Natasha Mead, M.D.    Disposition: Home    Follow-up Information    Follow up with NORRIS,STEVEN R in  2 weeks. (call [phone] for appopintment)    Contact information:   [facility] [address], [address] [place] [address] [phone]           Signed: Thea Gist 07/23/2011, 5:42 PM

## 2011-07-23 NOTE — Discharge Summary (Signed)
   Physician Discharge Summary  Patient ID: Breanna Pratt MRN: [id number] DOB/AGE: 1956/07/28 55 y.o.  Admit date: 07/23/2011 Discharge date: 07/24/2011 Admission Diagnoses:  Active Problems:  Rotator cuff (capsule) sprain   Discharge Diagnoses:  Same   Surgeries: Procedure(s): SHOULDER ARTHROSCOPY WITH OPEN ROTATOR CUFF REPAIR AND DISTAL CLAVICLE ACROMINECTOMY on 07/23/2011   Consultants:none  Discharged Condition: Stable  Hospital Course: Breanna Pratt is an 55 y.o. female who was admitted 07/23/2011 with a chief complaint of Right shoulder pain and decreased function, and found to have a diagnosis of rotator cuff tear.  They were brought to the operating room on 07/23/2011 and underwent the above named procedures.  The patient had post op nausea and had to stay overnight. Occupational therapy for ROM right shoulder.  The patient had an uncomplicated hospital course and was stable for discharge.  Recent vital signs:  Filed Vitals:   07/23/11 0554  BP: 112/68  Pulse: 59  Temp: 97.7 F (36.5 C)  Resp: 18    Recent laboratory studies:  Results for orders placed during the hospital encounter of 07/23/11  GLUCOSE, CAPILLARY      Component Value Range   Glucose-Capillary 183 (*) 70 - 99 (mg/dL)    Discharge Medications:   Current Discharge Medication List    CONTINUE these medications which have NOT CHANGED   Details  acyclovir (ZOVIRAX) 400 MG tablet Take 400 mg by mouth 2 (two) times daily.      gabapentin (NEURONTIN) 300 MG capsule Take 300 mg by mouth daily.      insulin glargine (LANTUS) 100 UNIT/ML injection Inject 35 Units into the skin at bedtime.      insulin lispro (HUMALOG) 100 UNIT/ML injection Inject 8 Units into the skin 3 (three) times daily before meals. 8units in am 12units at lunch 15units at supper     lisinopril (PRINIVIL,ZESTRIL) 2.5 MG tablet Take 2.5 mg by mouth daily.      metFORMIN (GLUCOPHAGE) 500 MG tablet Take 500 mg by mouth daily.           Diagnostic Studies: Dg Chest 2 View  07/12/2011  *RADIOLOGY REPORT*  Clinical Data: Preop for right shoulder arthroscopy  CHEST - 2 VIEW  Comparison: None.  Findings: Cardiomediastinal silhouette is unremarkable.  No acute infiltrate or pleural effusion.  No pulmonary edema.  Mild degenerative changes thoracic spine.  IMPRESSION: No active disease.  Original Report Authenticated By: Natasha Mead, M.D.    Disposition: D/C to home   Follow-up Information    Follow up with Dayane Hillenburg,STEVEN R in 2 weeks. (call [phone] for appopintment)    Contact information:   [facility] [address], [address] [place] [address] [phone]           Signed: Verlee Rossetti 07/23/2011, 7:32 AM

## 2011-07-23 NOTE — Progress Notes (Signed)
1320  PT  ADMIINISTERED  10 UNITS OF HUMALOG INSULIN (VIA HER INSULIN PEN).......DR C E JACKSON AWARE OF CURRENT BLOOD SUGAR AND ORDER WAS GIVEN......REPORT GIVEN TO NURSE ON 5000, WHO WILL RECHECK SUGAR LEVELS UPON ARRIVAL TO THEIR UNIT...the patient WAS FEELING ALITTLE BETTER THEN PREVIOUSLY, BUT WANTED TO STAY........Marland Kitchen

## 2011-07-23 NOTE — Progress Notes (Signed)
Clarified with Dr. Ranell Patrick that consent on chart is okay to use-was done per paper orders. He is aware wording in EPIC is different.

## 2011-07-23 NOTE — Interval H&P Note (Signed)
History and Physical Interval Note:  07/23/2011 7:24 AM  Breanna Pratt  has presented today for surgery, with the diagnosis of right rotator cuff tear  The various methods of treatment have been discussed with the patient and family. After consideration of risks, benefits and other options for treatment, the patient has consented to  Procedure(s): SHOULDER ARTHROSCOPY WITH OPEN ROTATOR CUFF REPAIR AND DISTAL CLAVICLE ACROMINECTOMY as a surgical intervention .  The patients' history has been reviewed, patient examined, no change in status, stable for surgery.  I have reviewed the patients' chart and labs.  Questions were answered to the patient's satisfaction.     Dyshon Philbin,STEVEN R

## 2011-07-23 NOTE — Anesthesia Postprocedure Evaluation (Signed)
  Anesthesia Post-op Note  Patient: Breanna Pratt  Procedure(s) Performed:  SHOULDER ARTHROSCOPY WITH OPEN ROTATOR CUFF REPAIR AND DISTAL CLAVICLE ACROMINECTOMY  Patient Location: PACU  Anesthesia Type: GA combined with regional for post-op pain  Level of Consciousness: awake, alert  and oriented  Airway and Oxygen Therapy: Patient Spontanous Breathing and Patient connected to nasal cannula oxygen  Post-op Pain: none  Post-op Assessment: Post-op Vital signs reviewed, Patient's Cardiovascular Status Stable, Respiratory Function Stable, Patent Airway, No signs of Nausea or vomiting and Pain level controlled  Post-op Vital Signs: Reviewed and stable  Complications: No apparent anesthesia complications

## 2011-07-23 NOTE — Transfer of Care (Signed)
Immediate Anesthesia Transfer of Care Note  Patient: Breanna Pratt  Procedure(s) Performed:  SHOULDER ARTHROSCOPY WITH OPEN ROTATOR CUFF REPAIR AND DISTAL CLAVICLE ACROMINECTOMY  Patient Location: PACU  Anesthesia Type: General  Level of Consciousness: awake and oriented  Airway & Oxygen Therapy: Patient Spontanous Breathing and Patient connected to nasal cannula oxygen  Post-op Assessment: Report given to PACU RN and Post -op Vital signs reviewed and stable  Post vital signs: Reviewed and stable  Complications: No apparent anesthesia complications

## 2011-07-23 NOTE — Preoperative (Signed)
Beta Blockers   Reason not to administer Beta Blockers:Not Applicable 

## 2011-07-23 NOTE — Progress Notes (Signed)
Dr. Ivin Booty notified that patient had 1/2 cup coke and chicken broth @ 0330 because CBG was 67.

## 2011-07-23 NOTE — Anesthesia Procedure Notes (Addendum)
Anesthesia Regional Block:  Interscalene brachial plexus block  Pre-Anesthetic Checklist: ,, timeout performed, Correct Patient, Correct Site, Correct Laterality, Correct Procedure, Correct Position, site marked, Risks and benefits discussed,  Surgical consent,  Pre-op evaluation,  At surgeon's request and post-op pain management  Laterality: Right  Prep: chloraprep       Needles:  Injection technique: Single-shot  Needle Type: Stimulator Needle - 40      Needle Gauge: 22 and 22 G    Additional Needles:  Procedures: nerve stimulator Interscalene brachial plexus block  Nerve Stimulator or Paresthesia:  Response: forearm twitch, 0.45 mA, 1 ms,   Additional Responses:   Narrative:  Start time: 07/23/2011 7:26 AM End time: 07/23/2011 7:33 AM Injection made incrementally with aspirations every 5 mL.  Performed by: Personally  Anesthesiologist: Sandford Craze, MD  Additional Notes: Pt identified in Holding room.  Monitors applied. Working IV access confirmed. Sterile prep.  #22ga PNS to forearm twitch at 0.32mA threshold.  30cc 0.5% Bupivacaine with 1:200k epi injected incrementally after negative test dose.  Patient asymptomatic, VSS, no heme aspirated, tolerated well.   Sandford Craze, MD   Procedure Name: Intubation Date/Time: 07/23/2011 7:47 AM Performed by: Caryn Bee Pre-anesthesia Checklist: Patient identified, Emergency Drugs available, Suction available, Patient being monitored and Timeout performed Patient Re-evaluated:Patient Re-evaluated prior to inductionOxygen Delivery Method: Circle System Utilized Preoxygenation: Pre-oxygenation with 100% oxygen Intubation Type: IV induction Ventilation: Mask ventilation without difficulty Laryngoscope Size: Mac and 3 Grade View: Grade I Tube type: Oral Tube size: 7.0 mm Number of attempts: 1 Airway Equipment and Method: stylet Placement Confirmation: ETT inserted through vocal cords under direct vision,  positive ETCO2  and breath sounds checked- equal and bilateral Secured at: 22 cm Tube secured with: Tape Dental Injury: Teeth and Oropharynx as per pre-operative assessment

## 2011-07-23 NOTE — Brief Op Note (Signed)
07/23/2011  9:47 AM  PATIENT:  Breanna Pratt  55 y.o. female  PRE-OPERATIVE DIAGNOSIS:  right rotator cuff tear, symptomatic AC joint DJD POST-OPERATIVE DIAGNOSIS:  right rotator cuff tear, SLAP lesion, symptomatic AC joint DJD  PROCEDURE:  Procedure(s): SHOULDER ARTHROSCOPY WITH OPEN ROTATOR CUFF REPAIR, SLAP DEBRIDEMENT, AND DISTAL CLAVICLE EXCISION  SURGEON:  Surgeon(s): Verlee Rossetti  PHYSICIAN ASSISTANT:   ASSISTANTS: Modesto Charon, PA-C   ANESTHESIA:   regional and general  EBL:     BLOOD ADMINISTERED:none  DRAINS: none   LOCAL MEDICATIONS USED:  MARCAINE 10 CC  SPECIMEN:  No Specimen  DISPOSITION OF SPECIMEN:  N/A  COUNTS:  YES  TOURNIQUET:  * No tourniquets in log *  DICTATION: .Other Dictation: Dictation Number [id number]  PLAN OF CARE: Discharge to home after PACU  PATIENT DISPOSITION:  PACU - hemodynamically stable.   Delay start of Pharmacological VTE agent (>24hrs) due to surgical blood loss or risk of bleeding:  {YES/NO/NOT APPLICABLE:20182

## 2011-07-24 LAB — HEMOGLOBIN AND HEMATOCRIT, BLOOD: HCT: 35.7 % — ABNORMAL LOW (ref 36.0–46.0)

## 2011-07-24 LAB — BASIC METABOLIC PANEL
CO2: 26 mEq/L (ref 19–32)
Calcium: 8.6 mg/dL (ref 8.4–10.5)
Creatinine, Ser: 0.64 mg/dL (ref 0.50–1.10)

## 2011-07-24 MED ORDER — HYDROMORPHONE HCL 2 MG PO TABS: 2.0000 mg | ORAL_TABLET | ORAL | Status: AC | PRN

## 2011-07-24 MED ORDER — HYDROMORPHONE HCL 2 MG PO TABS
2.0000 mg | ORAL_TABLET | ORAL | Status: DC | PRN
  Administered 2011-07-24: 2 mg via ORAL
  Filled 2011-07-24: qty 1

## 2011-07-24 NOTE — Progress Notes (Signed)
OT Note:  Evaluation and education provided as per MD order, paper copy filed in shadow chart.  No further acute OT needs.  Pt able to verbalize and/or demonstrate information taught.  Pt states she has outpatient therapy appointment already scheduled for Monday.  Thanks. 07/24/2011 Martie Round, OTR/L Pager: [phone]

## 2011-07-24 NOTE — Progress Notes (Signed)
Subjective:Pain in Rt Shoulder Current po pain med not covering pain. No other complaints.   Objective: Vital signs in last 24 hours: Temp:  [97.5 F (36.4 C)-99.9 F (37.7 C)] 98.7 F (37.1 C) (12/15 0614) Pulse Rate:  [53-87] 87  (12/15 0614) Resp:  [13-20] 18  (12/15 0614) BP: (97-121)/(49-66) 107/55 mmHg (12/15 0614) SpO2:  [93 %-100 %] 95 % (12/15 0614) Weight:  [63.101 kg (139 lb 1.8 oz)] 139 lb 1.8 oz (63.101 kg) (12/14 1503)  Intake/Output from previous day: 12/14 0701 - 12/15 0700 In: 2140 [P.O.:240; I.V.:1900] Out: 50 [Blood:50] Intake/Output this shift:     Basename 07/24/11 0500  HGB 11.6*    Basename 07/24/11 0500  WBC --  RBC --  HCT 35.7*  PLT --    Basename 07/24/11 0500  NA 137  K 3.9  CL 103  CO2 26  BUN 9  CREATININE 0.64  GLUCOSE 206*  CALCIUM 8.6   No results found for this basename: LABPT:2,INR:2 in the last 72 hours  Rt shoulder dressing clean and dry, arm in abd pillow and sling, hand NMVI  Assessment/Plan: POD # 1 RT shoulder scope needs improved pain control. Will change to Dilaudid PO due iv meds works. Then D/C Home .   Jamelle Rushing 07/24/2011, 10:24 AM

## 2011-07-24 NOTE — Op Note (Signed)
Breanna Pratt, Breanna Pratt                ACCOUNT NO.:  [id number]  MEDICAL RECORD NO.:  [id number]  LOCATION:  5035                         FACILITY:  [facility]  PHYSICIAN:  Almedia Balls. Ranell Patrick, M.D. DATE OF BIRTH:  Nov 11, 1955  DATE OF PROCEDURE:  07/23/2011 DATE OF DISCHARGE:                              OPERATIVE REPORT   PREOPERATIVE DIAGNOSES: 1. Right shoulder rotator cuff tear. 2. Right shoulder symptomatic acromioclavicular joint arthritis.  POSTOPERATIVE DIAGNOSES: 1. Right shoulder rotator cuff tear. 2. Right shoulder superior labral anterior-posterior lesion. 3. Right shoulder partial tear of proximal biceps. 4. Right shoulder symptomatic acromioclavicular arthritis.  PROCEDURE PERFORMED:  Right shoulder arthroscopy with extensive intra- articular debridement of torn superior labrum anterior to posterior. Arthroscopic biceps tenotomy.  Arthroscopic subacromial decompression followed by mini open rotator cuff repair of the biceps tenodesis in the groove and open distal clavicle resection.  ATTENDING SURGEON:  Almedia Balls. Ranell Patrick, MD  ASSISTANT:  Donnie Coffin. Dixon, PA-C  ANESTHESIA:  General anesthesia with interscalene block anesthesia was used.  ESTIMATED BLOOD LOSS:  Minimal.  FLUID REPLACEMENT:  1200 mL crystalloid.  INSTRUMENT COUNT:  Correct.  COMPLICATIONS:  None.  Perioperative antibiotics were given.  INDICATIONS:  The patient is a 55 year old female with worsening right shoulder pain secondary to an MRI documented rotator cuff tear.  The patient has failed conservative management at this point consisting modification of activity, anti-inflammatories injections, presents for operative treatment to restore function and eliminate pain to her injured right shoulder.  Informed consent was obtained.  DESCRIPTION OF PROCEDURE:  After an adequate level of anesthesia was achieved, the patient was positioned in a modified beach-chair position. Right shoulder correctly  identified and sterilely prepped and draped in usual sterile manner.  A time-out was called.  We then entered the shoulder in standard portals including anterior, posterior, and lateral portals.  We identified severely torn proximal biceps tendon with advanced degeneration and tearing in the superior labrum.  Form of the labral debrided back to stable labral tissue and the biceps tenotomy using basket forceps and motorized shaver.  We then did an inspection of the joint noting a normal subscapularis.  There was a tear on the anterior leading edge of supraspinatus tendon.  The articular cartilage on the glenoid face and the humeral head was intact.  The posterior- inferior and the anterior-inferior labrum were intact.  There was some evidence of capsular inflammation or synovitis.  Following completion of her biceps tenotomy and labral debridement, we placed a scope in subacromial space.  Thorough bursectomy was performed followed by an acromioplasty creating a type 1 acromial shape with a butcher block technique using a high-speed bur.  We did this all the way over to the [facility] joint and then out to the anterolateral corner and along the lateral border of the acromion where there was some lateral overhang.  We then did thorough decompression of the rotator cuff outlet.  I was able to identify the rotator cuff tear from the bursal surface as well.  This seemed to be in the supraspinatus area.  Remainder of the cuff looked normal from the bursal side.  We then concluded the arthroscopic portion of the surgery.  We then made a saber incision overlying the AC joint. Dissection down through subcutaneous tissues using Bovie.  We identified the deltotrapezial fascia.  We divided them in line with distal clavicle repair.  We performed a subperiosteal dissection of the distal clavicle followed by excision of distal 3-4 mm using oscillating saw.  We thoroughly irrigated the distal clavicle.  Once we  irrigated that AC interval, we applied bone wax to cut into the clavicle.  I then repaired the deltotrapezial fascia with 0 Vicryl suture figure-of-eight, followed by 2-0 Vicryl subcutaneous closure and 4-0 Monocryl for skin.  We then approached the rotator cuff tear at the biceps area through a single incision.  This was a mini open incision starting at the anterior level of acromion and staying distally about 4 cm.  Dissection down through subcutaneous tissues.  Using the needle tip Bovie, we identified the raphe between the anterior and lateral heads of the deltoid.  We then went ahead and sized that with a needle-tip Bovie, placed the Arthrex retractor, identified the biceps groove, and then delivered the tendon out of the wound using a needle-tip Bovie to incise the soft tissue overlying the biceps tendon.  We then went ahead and placed a Hi-Fi suture and a baseball stitch in the portion of the tendon that would be used for the tenodesis.  This was to reinforce the tendon.  We then placed a single Biomet juggernaut suture anchor through the full length of bicipital groove.  We then went ahead and brought that suture up through the reinforced portion of the tendon tying the tendon down and flushing to the biceps groove.  We over sewed the soft tissue over the top of the biceps groove generating nice low profile repair.  At this point, we went ahead and addressed the rotator cuff tear.  This was about 1-1.5 cm tear of the supraspinatus.  We roughened up the rotator cuff footprint with the rongeur and curette.  We then placed a mattress suture medial to the repair site.  We then went ahead and placed a 4.5 ConMed Linvatec BioComposite corkscrew anchor at the articular margin. We brought the #2 nonabsorbable suture up through the rotator cuff tendon in a mattress fashion.  Next, we went ahead and created some small bone tunnels and took a #2 Hi-Fi through bone tunnels through the lateral  margin of the repair.  We tied our medial sutures first, then our lateral sutures, and we finally brought that mattress suture over the entire construct and used a 3.5 ConMed Linvatec PopLock out over the lateral humerus, that compressed the repair site nicely.  We thoroughly irrigated the subdeltoid interval, no impingement was noted.  No tension on the repair.  We then repaired the deltoid to itself with 0 Vicryl suture, followed by 2-0 Vicryl subcutaneous closure and 4-0 Monocryl for skin.  Steri-Strips were applied followed by sterile dressing.  The patient tolerated the surgery well.     Almedia Balls. Ranell Patrick, M.D.     SRN/MEDQ  D:  07/23/2011  T:  07/23/2011  Job:  [id number]

## 2011-07-29 ENCOUNTER — Other Ambulatory Visit: Payer: Self-pay | Admitting: Obstetrics and Gynecology

## 2011-07-29 DIAGNOSIS — Z1231 Encounter for screening mammogram for malignant neoplasm of breast: Secondary | ICD-10-CM

## 2011-10-11 ENCOUNTER — Ambulatory Visit (HOSPITAL_COMMUNITY): Payer: BC Managed Care – PPO

## 2011-10-15 ENCOUNTER — Emergency Department (HOSPITAL_COMMUNITY): Payer: BC Managed Care – PPO

## 2011-10-15 ENCOUNTER — Encounter (HOSPITAL_COMMUNITY): Payer: Self-pay

## 2011-10-15 ENCOUNTER — Emergency Department (HOSPITAL_COMMUNITY)
Admission: EM | Admit: 2011-10-15 | Discharge: 2011-10-15 | Disposition: A | Discharge status: Home / Self Care | Payer: BC Managed Care – PPO | Attending: Emergency Medicine | Admitting: Emergency Medicine

## 2011-10-15 DIAGNOSIS — G589 Mononeuropathy, unspecified: Secondary | ICD-10-CM | POA: Insufficient documentation

## 2011-10-15 DIAGNOSIS — R110 Nausea: Secondary | ICD-10-CM | POA: Insufficient documentation

## 2011-10-15 DIAGNOSIS — M549 Dorsalgia, unspecified: Secondary | ICD-10-CM

## 2011-10-15 DIAGNOSIS — Z794 Long term (current) use of insulin: Secondary | ICD-10-CM | POA: Insufficient documentation

## 2011-10-15 DIAGNOSIS — R109 Unspecified abdominal pain: Secondary | ICD-10-CM | POA: Insufficient documentation

## 2011-10-15 DIAGNOSIS — R21 Rash and other nonspecific skin eruption: Secondary | ICD-10-CM | POA: Insufficient documentation

## 2011-10-15 DIAGNOSIS — D72829 Elevated white blood cell count, unspecified: Secondary | ICD-10-CM | POA: Insufficient documentation

## 2011-10-15 DIAGNOSIS — E119 Type 2 diabetes mellitus without complications: Secondary | ICD-10-CM | POA: Insufficient documentation

## 2011-10-15 LAB — URINALYSIS, ROUTINE W REFLEX MICROSCOPIC
Bilirubin Urine: NEGATIVE
Glucose, UA: NEGATIVE mg/dL
Hgb urine dipstick: NEGATIVE
Ketones, ur: NEGATIVE mg/dL
Protein, ur: NEGATIVE mg/dL

## 2011-10-15 LAB — DIFFERENTIAL
Eosinophils Absolute: 0.4 10*3/uL (ref 0.0–0.7)
Eosinophils Relative: 3 % (ref 0–5)
Lymphs Abs: 3.9 10*3/uL (ref 0.7–4.0)
Monocytes Absolute: 1.2 10*3/uL — ABNORMAL HIGH (ref 0.1–1.0)
Monocytes Relative: 8 % (ref 3–12)

## 2011-10-15 LAB — CBC
Hemoglobin: 12.8 g/dL (ref 12.0–15.0)
MCH: 32.4 pg (ref 26.0–34.0)
MCV: 94.4 fL (ref 78.0–100.0)
Platelets: 259 10*3/uL (ref 150–400)
RBC: 3.95 MIL/uL (ref 3.87–5.11)

## 2011-10-15 LAB — URINE MICROSCOPIC-ADD ON

## 2011-10-15 LAB — POCT I-STAT, CHEM 8
BUN: 17 mg/dL (ref 6–23)
Calcium, Ion: 1.19 mmol/L (ref 1.12–1.32)
Creatinine, Ser: 0.8 mg/dL (ref 0.50–1.10)
TCO2: 27 mmol/L (ref 0–100)

## 2011-10-15 MED ORDER — IBUPROFEN 600 MG PO TABS: 600.0000 mg | ORAL_TABLET | Freq: Four times a day (QID) | ORAL | Status: AC | PRN

## 2011-10-15 MED ORDER — KETOROLAC TROMETHAMINE 30 MG/ML IJ SOLN
30.0000 mg | Freq: Once | INTRAMUSCULAR | Status: AC
  Administered 2011-10-15: 30 mg via INTRAVENOUS
  Filled 2011-10-15: qty 1

## 2011-10-15 MED ORDER — DIAZEPAM 5 MG PO TABS: 5.0000 mg | ORAL_TABLET | Freq: Four times a day (QID) | ORAL | Status: AC | PRN

## 2011-10-15 MED ORDER — HYDROMORPHONE HCL PF 1 MG/ML IJ SOLN
0.5000 mg | Freq: Once | INTRAMUSCULAR | Status: AC
  Administered 2011-10-15: 0.5 mg via INTRAVENOUS
  Filled 2011-10-15: qty 1

## 2011-10-15 MED ORDER — ONDANSETRON HCL 4 MG/2ML IJ SOLN
4.0000 mg | Freq: Once | INTRAMUSCULAR | Status: AC
  Administered 2011-10-15: 4 mg via INTRAVENOUS
  Filled 2011-10-15: qty 2

## 2011-10-15 MED ORDER — SODIUM CHLORIDE 0.9 % IV BOLUS (SEPSIS)
500.0000 mL | Freq: Once | INTRAVENOUS | Status: AC
  Administered 2011-10-15: 500 mL via INTRAVENOUS

## 2011-10-15 MED ORDER — OXYCODONE-ACETAMINOPHEN 5-325 MG PO TABS: 1.0000 | ORAL_TABLET | Freq: Four times a day (QID) | ORAL | Status: AC | PRN

## 2011-10-15 NOTE — Discharge Instructions (Signed)
Back Pain, Adult Low back pain is very common. About 1 in 5 people have back pain.The cause of low back pain is rarely dangerous. The pain often gets better over time.About half of people with a sudden onset of back pain feel better in just 2 weeks. About 8 in 10 people feel better by 6 weeks.  CAUSES Some common causes of back pain include:  Strain of the muscles or ligaments supporting the spine.   Wear and tear (degeneration) of the spinal discs.   Arthritis.   Direct injury to the back.  DIAGNOSIS Most of the time, the direct cause of low back pain is not known.However, back pain can be treated effectively even when the exact cause of the pain is unknown.Answering your caregiver's questions about your overall health and symptoms is one of the most accurate ways to make sure the cause of your pain is not dangerous. If your caregiver needs more information, he or she may order lab work or imaging tests (X-rays or MRIs).However, even if imaging tests show changes in your back, this usually does not require surgery. HOME CARE INSTRUCTIONS For many people, back pain returns.Since low back pain is rarely dangerous, it is often a condition that people can learn to manageon their own.   Remain active. It is stressful on the back to sit or stand in one place. Do not sit, drive, or stand in one place for more than 30 minutes at a time. Take short walks on level surfaces as soon as pain allows.Try to increase the length of time you walk each day.   Do not stay in bed.Resting more than 1 or 2 days can delay your recovery.   Do not avoid exercise or work.Your body is made to move.It is not dangerous to be active, even though your back may hurt.Your back will likely heal faster if you return to being active before your pain is gone.   Pay attention to your body when you bend and lift. Many people have less discomfortwhen lifting if they bend their knees, keep the load close to their  bodies,and avoid twisting. Often, the most comfortable positions are those that put less stress on your recovering back.   Find a comfortable position to sleep. Use a firm mattress and lie on your side with your knees slightly bent. If you lie on your back, put a pillow under your knees.   Only take over-the-counter or prescription medicines as directed by your caregiver. Over-the-counter medicines to reduce pain and inflammation are often the most helpful.Your caregiver may prescribe muscle relaxant drugs.These medicines help dull your pain so you can more quickly return to your normal activities and healthy exercise.   Put ice on the injured area.   Put ice in a plastic bag.   Place a towel between your skin and the bag.   Leave the ice on for 15 to 20 minutes, 3 to 4 times a day for the first 2 to 3 days. After that, ice and heat may be alternated to reduce pain and spasms.   Ask your caregiver about trying back exercises and gentle massage. This may be of some benefit.   Avoid feeling anxious or stressed.Stress increases muscle tension and can worsen back pain.It is important to recognize when you are anxious or stressed and learn ways to manage it.Exercise is a great option.  SEEK MEDICAL CARE IF:  You have pain that is not relieved with rest or medicine.   You have   pain that does not improve in 1 week.   You have new symptoms.   You are generally not feeling well.  SEEK IMMEDIATE MEDICAL CARE IF:   You have pain that radiates from your back into your legs.   You develop new bowel or bladder control problems.   You have unusual weakness or numbness in your arms or legs.   You develop nausea or vomiting.   You develop abdominal pain.   You feel faint.  Document Released: 07/26/2005 Document Revised: 07/15/2011 Document Reviewed: 12/14/2010 [employer] Patient Information 2012 [employer]. 

## 2011-10-15 NOTE — ED Provider Notes (Signed)
History     CSN: [id number]  Arrival date & time 10/15/11  0134   First MD Initiated Contact with Patient 10/15/11 0153      Chief Complaint  Patient presents with  . Flank Pain    (Consider location/radiation/quality/duration/timing/severity/associated sxs/prior treatment) The history is provided by the patient.   patient developed pain in her left lower back today. No trauma. It is sharp. The pain is constant but it has sharp episodes with it. She's chronic back pain, but it does not feel like this normally. No trauma. No loss of bladder or bowel control. No dysuria. She states certain positions make it better or worse, but she cannot find a comfortable position. No abdominal pain. No fevers. In December she had right shoulder surgery.  Past Medical History  Diagnosis Date  . Peripheral neuropathy     takes Gabapentin  . 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 Chronic back pain   . Osteopenia   . Eczema   . GERD (gastroesophageal reflux disease)     Prevacid prn  . Hemorrhoids   . Diabetes mellitus     takes Metformin daily;takes Humalog takes  8in am .12u at lunch and 15 at dinner.Lantus 35units at bedtime;;Average fasting sugars 100-120    Past Surgical History  Procedure Date  . Abdominal hysterectomy 10+yrs ago  . Dilation and curettage of uterus 30+yrs ago    x 2  . Cystoscopy     as a teenager  . Tubal ligation   . Colonoscopy 2010    Family History  Problem Relation Age of Onset  . Anesthesia problems Neg Hx   . Hypotension Neg Hx   . Malignant hyperthermia Neg Hx   . Pseudochol deficiency Neg Hx     History  Substance Use Topics  . Smoking status: Never Smoker   . Smokeless tobacco: Not on file  . Alcohol Use: No    OB History    Grav Para Term Preterm Abortions TAB SAB Ect Mult Living                  Review of Systems  Constitutional: Negative for activity change and appetite change.  HENT: Negative for neck stiffness.   Eyes: Negative for pain.    Respiratory: Negative for chest tightness and shortness of breath.   Cardiovascular: Negative for chest pain and leg swelling.  Gastrointestinal: Negative for nausea, vomiting, abdominal pain and diarrhea.  Genitourinary: Negative for flank pain.  Musculoskeletal: Positive for back pain.  Skin: Positive for rash.       Patient has had a chronic rash on her right mid thigh anteriorly  Neurological: Negative for weakness, numbness and headaches.  Psychiatric/Behavioral: Negative for behavioral problems.    Allergies  Review of patient's allergies indicates no known allergies.  Home Medications   Current Outpatient Rx  Name Route Sig Dispense Refill  . ACYCLOVIR 400 MG PO TABS Oral Take 400 mg by mouth 2 (two) times daily.      Marland Kitchen GABAPENTIN 300 MG PO CAPS Oral Take 300 mg by mouth daily.      . INSULIN GLARGINE 100 UNIT/ML [place] SOLN Subcutaneous Inject 40 Units into the skin at bedtime.     . INSULIN LISPRO (HUMAN) 100 UNIT/ML [place] SOLN Subcutaneous Inject 15 Units into the skin 3 (three) times daily before meals.     . METHOCARBAMOL 500 MG PO TABS Oral Take 500 mg by mouth 3 (three) times daily as  needed. Muscle spasm    . RAMIPRIL 2.5 MG PO CAPS Oral Take 2.5 mg by mouth daily.    Marland Kitchen SITAGLIPTIN-METFORMIN HCL 50-1000 MG PO TABS Oral Take 1 tablet by mouth 2 (two) times daily with a meal.    . DIAZEPAM 5 MG PO TABS Oral Take 1 tablet (5 mg total) by mouth every 6 (six) hours as needed (spasm). 10 tablet 0  . IBUPROFEN 600 MG PO TABS Oral Take 1 tablet (600 mg total) by mouth every 6 (six) hours as needed for pain. 20 tablet 0  . OXYCODONE-ACETAMINOPHEN 5-325 MG PO TABS Oral Take 1-2 tablets by mouth every 6 (six) hours as needed for pain. 20 tablet 0    BP 100/56  Pulse 88  Temp(Src) 98.3 F (36.8 C) (Oral)  Resp 18  Ht 5\' 4"  (1.626 m)  Wt 142 lb (64.411 kg)  BMI 24.37 kg/m2  SpO2 100%  Physical Exam  Nursing note and vitals reviewed. Constitutional: She is oriented to person,  place, and time. She appears well-developed and well-nourished.       Patient appears uncomfortable. She was hypotensive on initial vitals  HENT:  Head: Normocephalic and atraumatic.  Eyes: EOM are normal. Pupils are equal, round, and reactive to light.  Neck: Normal range of motion. Neck supple.  Cardiovascular: Normal rate, regular rhythm and normal heart sounds.   No murmur heard. Pulmonary/Chest: Effort normal and breath sounds normal. No respiratory distress. She has no wheezes. She has no rales.  Abdominal: Soft. Bowel sounds are normal. She exhibits no distension. There is no tenderness. There is no rebound and no guarding.  Genitourinary:       Tenderness at left lower back. The tenderness is above her pelvis.  Musculoskeletal: Normal range of motion.  Neurological: She is alert and oriented to person, place, and time. No cranial nerve deficit.  Skin: Skin is warm and dry.  Psychiatric: She has a normal mood and affect. Her speech is normal.    ED Course  Procedures (including critical care time)  Labs Reviewed  CBC - Abnormal; Notable for the following:    WBC 15.7 (*)    All other components within normal limits  DIFFERENTIAL - Abnormal; Notable for the following:    Neutro Abs 10.1 (*)    Monocytes Absolute 1.2 (*)    All other components within normal limits  URINALYSIS, ROUTINE W REFLEX MICROSCOPIC - Abnormal; Notable for the following:    Leukocytes, UA TRACE (*)    All other components within normal limits  POCT I-STAT, CHEM 8 - Abnormal; Notable for the following:    Glucose, Bld 126 (*)    All other components within normal limits  URINE MICROSCOPIC-ADD ON - Abnormal; Notable for the following:    Squamous Epithelial / LPF MANY (*)    Bacteria, UA FEW (*)    All other components within normal limits   Ct Abdomen Pelvis Wo Contrast  10/15/2011  *RADIOLOGY REPORT*  Clinical Data: Left flank pain, nausea, leukocytosis  CT ABDOMEN AND PELVIS WITHOUT CONTRAST   Technique:  Multidetector CT imaging of the abdomen and pelvis was performed following the standard protocol without intravenous contrast. Sagittal and coronal MPR images reconstructed from axial data set.  Comparison: None  Findings: Lung bases clear. No urinary tract calcification, hydronephrosis or ureteral dilatation. Unremarkable ureters and bladder. Within limits of a nonenhanced exam, no focal abnormalities of the liver, spleen, pancreas, kidneys, or adrenal glands identified. Uterus surgically absent with  nonvisualization of the ovaries.  Appendix not definitely visualized. Stomach and bowel loops grossly normal appearance for technique. No mass, adenopathy, free fluid or inflammatory process. No acute osseous findings. Question broad-based disc herniation L5-S1.  IMPRESSION: Question disc herniation L5-S1. No definite acute intra abdominal or intrapelvic abnormalities seen.  Original Report Authenticated By: Lollie Marrow, M.D.     1. Back pain       MDM  Left-sided back pain. Patient feels better after treatment. She does have some chronic back pain. CT is done and showed only a disc herniation.blood pressures improved blood pressures improved. No free fluid in abdomen. White count is elevated. No urinary tract infection. Patient feels much better. She states she's always been told her blood pressure runs low. She'll be discharged home with pain medication        Juliet Rude. Rubin Payor, MD 10/15/11 1610

## 2011-11-11 ENCOUNTER — Ambulatory Visit (HOSPITAL_COMMUNITY)
Admission: RE | Admit: 2011-11-11 | Discharge: 2011-11-11 | Disposition: A | Discharge status: Home / Self Care | Payer: BC Managed Care – PPO | Source: Ambulatory Visit | Attending: Obstetrics and Gynecology | Admitting: Obstetrics and Gynecology

## 2011-11-11 DIAGNOSIS — Z1231 Encounter for screening mammogram for malignant neoplasm of breast: Secondary | ICD-10-CM | POA: Insufficient documentation

## 2011-11-18 ENCOUNTER — Ambulatory Visit: Payer: Self-pay | Admitting: Family Medicine

## 2012-10-10 ENCOUNTER — Other Ambulatory Visit (HOSPITAL_COMMUNITY): Payer: Self-pay | Admitting: Family Medicine

## 2012-10-10 DIAGNOSIS — Z1231 Encounter for screening mammogram for malignant neoplasm of breast: Secondary | ICD-10-CM

## 2012-11-13 ENCOUNTER — Ambulatory Visit (HOSPITAL_COMMUNITY): Payer: BC Managed Care – PPO

## 2012-11-14 ENCOUNTER — Ambulatory Visit (HOSPITAL_COMMUNITY)
Admission: RE | Admit: 2012-11-14 | Discharge: 2012-11-14 | Disposition: A | Discharge status: Home / Self Care | Payer: BC Managed Care – PPO | Source: Ambulatory Visit | Attending: Family Medicine | Admitting: Family Medicine

## 2012-11-14 DIAGNOSIS — Z1231 Encounter for screening mammogram for malignant neoplasm of breast: Secondary | ICD-10-CM | POA: Insufficient documentation

## 2013-04-02 ENCOUNTER — Emergency Department (HOSPITAL_BASED_OUTPATIENT_CLINIC_OR_DEPARTMENT_OTHER)
Admission: EM | Admit: 2013-04-02 | Discharge: 2013-04-03 | Disposition: A | Discharge status: Home / Self Care | Payer: BC Managed Care – PPO | Attending: Emergency Medicine | Admitting: Emergency Medicine

## 2013-04-02 ENCOUNTER — Encounter (HOSPITAL_BASED_OUTPATIENT_CLINIC_OR_DEPARTMENT_OTHER): Payer: Self-pay | Admitting: Emergency Medicine

## 2013-04-02 DIAGNOSIS — Z8739 Personal history of other diseases of the musculoskeletal system and connective tissue: Secondary | ICD-10-CM | POA: Insufficient documentation

## 2013-04-02 DIAGNOSIS — Z79899 Other long term (current) drug therapy: Secondary | ICD-10-CM | POA: Insufficient documentation

## 2013-04-02 DIAGNOSIS — Z8619 Personal history of other infectious and parasitic diseases: Secondary | ICD-10-CM | POA: Insufficient documentation

## 2013-04-02 DIAGNOSIS — Z794 Long term (current) use of insulin: Secondary | ICD-10-CM | POA: Insufficient documentation

## 2013-04-02 DIAGNOSIS — T783XXA Angioneurotic edema, initial encounter: Secondary | ICD-10-CM | POA: Insufficient documentation

## 2013-04-02 DIAGNOSIS — T465X5A Adverse effect of other antihypertensive drugs, initial encounter: Secondary | ICD-10-CM | POA: Insufficient documentation

## 2013-04-02 DIAGNOSIS — E119 Type 2 diabetes mellitus without complications: Secondary | ICD-10-CM | POA: Insufficient documentation

## 2013-04-02 DIAGNOSIS — Z8669 Personal history of other diseases of the nervous system and sense organs: Secondary | ICD-10-CM | POA: Insufficient documentation

## 2013-04-02 DIAGNOSIS — G8929 Other chronic pain: Secondary | ICD-10-CM | POA: Insufficient documentation

## 2013-04-02 DIAGNOSIS — Z872 Personal history of diseases of the skin and subcutaneous tissue: Secondary | ICD-10-CM | POA: Insufficient documentation

## 2013-04-02 DIAGNOSIS — Z8679 Personal history of other diseases of the circulatory system: Secondary | ICD-10-CM | POA: Insufficient documentation

## 2013-04-02 HISTORY — DX: Herpesviral infection, unspecified: B00.9

## 2013-04-02 NOTE — ED Provider Notes (Signed)
Scribed for Breanna Seamen, MD, the patient was seen in room MH11/MH11. This chart was scribed by Lewanda Rife, ED scribe. Patient's care was started at 2307  CSN: [id number]     Arrival date & time 04/02/13  2246 History   First MD Initiated Contact with Patient 04/02/13 2305     Chief Complaint  Patient presents with  . Mouth Lesions   (Consider location/radiation/quality/duration/timing/severity/associated sxs/prior Treatment) The history is provided by the patient.   HPI Comments: Breanna Pratt is a 57 y.o. female who presents to the Emergency Department complaining of unchanged mild "purple mouth lesions" onset July of this year. Denies associated pain to "lesions", and difficulty breathing. Reports her OB-Gyn tested her for herpes with a blood test and was prescribed acyclovir, but never took it until July for 1 week when the "mouth sores" appeared. Reports she has never had a herpes outbreak. Reports taking a friend's nystatin for "mouth sores" with no relief of symptoms. Denies any alleviating or aggravating factors. Denies taking peptobismal, and Dilantin. Reports taking Altace for 8 years.    The patient specific reason for presenting at this time is the sudden onset of a "blisterlike" lesion of the right lower lip that occurred about an hour prior to arrival while she was eating.  Past Medical History  Diagnosis Date  . Peripheral neuropathy     takes Gabapentin  . 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 Chronic back pain   . Osteopenia   . Eczema   . GERD (gastroesophageal reflux disease)     Prevacid prn  . Hemorrhoids   . Diabetes mellitus     takes Metformin daily;takes Humalog takes  8in am .12u at lunch and 15 at dinner.Lantus 35units at bedtime;;Average fasting sugars 100-120  . Herpes    Past Surgical History  Procedure Laterality Date  . Abdominal hysterectomy  10+yrs ago  . Dilation and curettage of uterus  30+yrs ago    x 2  . Cystoscopy      as a teenager  .  Tubal ligation    . Colonoscopy  2010  . Shoulder arthroscopy with rotator cuff repair Bilateral    Family History  Problem Relation Age of Onset  . Anesthesia problems Neg Hx   . Hypotension Neg Hx   . Malignant hyperthermia Neg Hx   . Pseudochol deficiency Neg Hx    History  Substance Use Topics  . Smoking status: Never Smoker   . Smokeless tobacco: Not on file  . Alcohol Use: No   OB History   Grav Para Term Preterm Abortions TAB SAB Ect Mult Living                 Review of Systems  HENT: Positive for mouth sores.    A complete 10 system review of systems was obtained and all systems are negative except as noted in the HPI and PMH.    Allergies  Review of patient's allergies indicates no known allergies.  Home Medications   Current Outpatient Rx  Name  Route  Sig  Dispense  Refill  . acyclovir (ZOVIRAX) 400 MG tablet   Oral   Take 400 mg by mouth 2 (two) times daily.           Marland Kitchen gabapentin (NEURONTIN) 300 MG capsule   Oral   Take 300 mg by mouth daily.           . insulin glargine (LANTUS) 100 UNIT/ML injection  Subcutaneous   Inject 40 Units into the skin at bedtime.          . insulin lispro (HUMALOG) 100 UNIT/ML injection   Subcutaneous   Inject 15 Units into the skin 3 (three) times daily before meals.          . methocarbamol (ROBAXIN) 500 MG tablet   Oral   Take 500 mg by mouth 3 (three) times daily as needed. Muscle spasm         . ramipril (ALTACE) 2.5 MG capsule   Oral   Take 2.5 mg by mouth daily.         . sitaGLIPtan-metformin (JANUMET) 50-1000 MG per tablet   Oral   Take 1 tablet by mouth 2 (two) times daily with a meal.          There were no vitals taken for this visit. Physical Exam  Nursing note and vitals reviewed. Constitutional: She is oriented to person, place, and time. She appears well-developed and well-nourished. No distress.  HENT:  Head: Normocephalic and atraumatic.  Mouth/Throat: Mucous membranes are  normal. No posterior oropharyngeal edema.  Mild erythema of gums and extensive dental restorations  Mild angioedema of right lower lip Airway patent. No stridor, and no dysphonia.  Eyes: EOM are normal.  Neck: Neck supple. No tracheal deviation present.  Cardiovascular: Normal rate, regular rhythm and normal heart sounds.   No murmur heard. Pulmonary/Chest: Effort normal and breath sounds normal. No respiratory distress. She has no wheezes.  Abdominal: Soft. Bowel sounds are normal. There is no tenderness.  Musculoskeletal: Normal range of motion.  Neurological: She is alert and oriented to person, place, and time.  Skin: Skin is warm and dry.  Psychiatric: She has a normal mood and affect. Her behavior is normal.    ED Course  Procedures (including critical care time)     MDM  12:46 AM No progression. Advised patient to stop Altace and contact her PCP later today. Return if worsening.  I personally performed the services described in this documentation, which was scribed in my presence.  The recorded information has been reviewed and is accurate.   Breanna Seamen, MD 04/03/13 [phone]

## 2013-04-02 NOTE — ED Notes (Signed)
MD at bedside. 

## 2013-04-02 NOTE — ED Notes (Signed)
Pt states symptoms began in July, presented as purple lesions.  Pt states symptoms never improved.  Pt currently has a clear lesion with swelling on the right side of her lip, noticeable when she opens her mouth.  Pt states the inside of her mouth feels rough and irritating.  Pt is diagnosed with herpes 1.

## 2014-01-02 ENCOUNTER — Ambulatory Visit
Admission: RE | Admit: 2014-01-02 | Discharge: 2014-01-02 | Disposition: A | Discharge status: Home / Self Care | Payer: PRIVATE HEALTH INSURANCE | Source: Ambulatory Visit | Attending: Family Medicine | Admitting: Family Medicine

## 2014-01-02 ENCOUNTER — Other Ambulatory Visit: Payer: Self-pay | Admitting: Family Medicine

## 2014-01-02 DIAGNOSIS — M25552 Pain in left hip: Secondary | ICD-10-CM

## 2014-01-10 ENCOUNTER — Other Ambulatory Visit (HOSPITAL_COMMUNITY): Payer: Self-pay | Admitting: Family Medicine

## 2014-01-10 DIAGNOSIS — Z1231 Encounter for screening mammogram for malignant neoplasm of breast: Secondary | ICD-10-CM

## 2014-01-23 ENCOUNTER — Ambulatory Visit (HOSPITAL_COMMUNITY)
Admission: RE | Admit: 2014-01-23 | Discharge: 2014-01-23 | Disposition: A | Discharge status: Home / Self Care | Payer: PRIVATE HEALTH INSURANCE | Source: Ambulatory Visit | Attending: Family Medicine | Admitting: Family Medicine

## 2014-01-23 DIAGNOSIS — Z1231 Encounter for screening mammogram for malignant neoplasm of breast: Secondary | ICD-10-CM | POA: Insufficient documentation

## 2014-02-15 ENCOUNTER — Other Ambulatory Visit: Payer: Self-pay | Admitting: Family Medicine

## 2014-02-15 DIAGNOSIS — R51 Headache: Secondary | ICD-10-CM

## 2014-03-01 ENCOUNTER — Ambulatory Visit
Admission: RE | Admit: 2014-03-01 | Discharge: 2014-03-01 | Disposition: A | Discharge status: Home / Self Care | Payer: PRIVATE HEALTH INSURANCE | Source: Ambulatory Visit | Attending: Family Medicine | Admitting: Family Medicine

## 2014-03-01 DIAGNOSIS — R51 Headache: Secondary | ICD-10-CM

## 2014-03-14 ENCOUNTER — Encounter: Payer: Self-pay | Admitting: Diagnostic Neuroimaging

## 2014-03-14 ENCOUNTER — Ambulatory Visit (INDEPENDENT_AMBULATORY_CARE_PROVIDER_SITE_OTHER): Payer: PRIVATE HEALTH INSURANCE | Admitting: Diagnostic Neuroimaging

## 2014-03-14 VITALS — BP 114/67 | HR 73 | Temp 98.0°F | Ht 62.5 in | Wt 139.4 lb

## 2014-03-14 DIAGNOSIS — G43009 Migraine without aura, not intractable, without status migrainosus: Secondary | ICD-10-CM

## 2014-03-14 MED ORDER — TOPIRAMATE 50 MG PO TABS: 50.0000 mg | ORAL_TABLET | Freq: Two times a day (BID) | ORAL | Status: DC

## 2014-03-14 MED ORDER — RIZATRIPTAN BENZOATE 10 MG PO TBDP: 10.0000 mg | ORAL_TABLET | ORAL | Status: DC | PRN

## 2014-03-14 NOTE — Progress Notes (Signed)
[facility]  PATIENT: Breanna Pratt DOB: [birth year]  REFERRING CLINICIAN: A Nnodi HISTORY FROM: patient  REASON FOR VISIT: new consult   HISTORICAL  CHIEF COMPLAINT:  Chief Complaint  Patient presents with  . Headache    HISTORY OF PRESENT ILLNESS:   58 year old right-handed female with diabetes, here for evaluation of headaches.  For past 3 years patient has had frontal headaches, occipital headaches, right-sided headaches, right ear pain, TMJ pain in the right side, associated with sinus pressure, allergies, nausea, photophobia and phonophobia. No warning prior to onset of headache. Patient tried tramadol exertion without relief. Patient having at least 2 severe days of headache per month and 10 mild days of headaches per month. Patient had similar headaches since her teenage years with pressure and throbbing sensation. Sometimes she is crawling sensation on her skin. Weather change can be a trigger. Patient never officially diagnosed with migraine headaches. Patient's mother has migraine headaches. Patient's mother also has a "brain tumor", possibly meningioma status post resection.   REVIEW OF SYSTEMS: Full 14 system review of systems performed and notable only for difficulty swallowing headache blurred vision joint pain skin sensitivity.  ALLERGIES: No Known Allergies  HOME MEDICATIONS: Outpatient Prescriptions Prior to Visit  Medication Sig Dispense Refill  . acyclovir (ZOVIRAX) 400 MG tablet Take 400 mg by mouth 2 (two) times daily.        Marland Kitchen gabapentin (NEURONTIN) 300 MG capsule Take 300 mg by mouth daily.        . insulin glargine (LANTUS) 100 UNIT/ML injection Inject 40 Units into the skin at bedtime.       . insulin lispro (HUMALOG) 100 UNIT/ML injection Inject 15 Units into the skin 3 (three) times daily before meals.       . methocarbamol (ROBAXIN) 500 MG tablet Take 500 mg by mouth 3 (three) times daily as needed. Muscle spasm      .  sitaGLIPtan-metformin (JANUMET) 50-1000 MG per tablet Take 1 tablet by mouth 2 (two) times daily with a meal.       No facility-administered medications prior to visit.    PAST MEDICAL HISTORY: Past Medical History  Diagnosis Date  . Peripheral neuropathy     takes Gabapentin  . 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 Chronic back pain   . Osteopenia   . Eczema   . GERD (gastroesophageal reflux disease)     Prevacid prn  . Hemorrhoids   . Diabetes mellitus     takes Metformin daily;takes Humalog takes  8in am .12u at lunch and 15 at dinner.Lantus 35units at bedtime;;Average fasting sugars 100-120  . Herpes     PAST SURGICAL HISTORY: Past Surgical History  Procedure Laterality Date  . Abdominal hysterectomy  10+yrs ago  . Dilation and curettage of uterus  30+yrs ago    x 2  . Cystoscopy      as a teenager  . Tubal ligation    . Colonoscopy  2010  . Shoulder arthroscopy with rotator cuff repair Bilateral     FAMILY HISTORY: Family History  Problem Relation Age of Onset  . Anesthesia problems Neg Hx   . Hypotension Neg Hx   . Malignant hyperthermia Neg Hx   . Pseudochol deficiency Neg Hx   . Other Mother     brain tumor  . Other Father     gsw complications  . Diabetes Father   . Other Maternal Grandfather     brain tumor  SOCIAL HISTORY:  History   Social History  . Marital Status: Single    Spouse Name: N/A    Number of Children: 1  . Years of Education: HS   Occupational History  . 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 Smoking status: Never Smoker   . Smokeless tobacco: Never Used  . Alcohol Use: No  . Drug Use: No  . Sexual Activity: No   Other Topics Concern  . Not on file   Social History Narrative   Patient lives at home with family.   Caffeine Use; 1-2 cups a week     PHYSICAL EXAM  Filed Vitals:   03/14/14 1300  BP: 114/67  Pulse: 73  Temp: 98 F (36.7 C)  TempSrc: Oral  Height: 5' 2.5" (1.588 m)    Weight: 139 lb 6.4 oz (63.231 kg)    Not recorded    Body mass index is 25.07 kg/(m^2).  GENERAL EXAM: Patient is in no distress; well developed, nourished and groomed; neck is supple  CARDIOVASCULAR: Regular rate and rhythm, no murmurs, no carotid bruits  NEUROLOGIC: MENTAL STATUS: awake, alert, oriented to person, place and time, recent and remote memory intact, normal attention and concentration, language fluent, comprehension intact, naming intact, fund of knowledge appropriate CRANIAL NERVE: no papilledema on fundoscopic exam, pupils equal and reactive to light, visual fields full to confrontation, extraocular muscles intact, no nystagmus, facial sensation and strength symmetric, hearing intact, palate elevates symmetrically, uvula midline, shoulder shrug symmetric, tongue midline. MOTOR: normal bulk and tone, full strength in the BUE, BLE SENSORY: normal and symmetric to light touch, pinprick, temperature, vibration  COORDINATION: finger-nose-finger, fine finger movements normal REFLEXES: deep tendon reflexes present and symmetric; EXCEPT ABSENT AT ANKLES GAIT/STATION: narrow based gait; romberg is negative    DIAGNOSTIC DATA (LABS, IMAGING, TESTING) - I reviewed patient records, labs, notes, testing and imaging myself where available.  Lab Results  Component Value Date   WBC 15.7* 10/15/2011   HGB 13.3 10/15/2011   HCT 39.0 10/15/2011   MCV 94.4 10/15/2011   PLT 259 10/15/2011      Component Value Date/Time   NA 141 10/15/2011 0253   K 3.7 10/15/2011 0253   CL 105 10/15/2011 0253   CO2 26 07/24/2011 0500   GLUCOSE 126* 10/15/2011 0253   BUN 17 10/15/2011 0253   CREATININE 0.80 10/15/2011 0253   CALCIUM 8.6 07/24/2011 0500   GFRNONAA >90 07/24/2011 0500   GFRAA >90 07/24/2011 0500   No results found for this basename: CHOL, HDL, LDLCALC, LDLDIRECT, TRIG, CHOLHDL   No results found for this basename: HGBA1C   No results found for this basename: VITAMINB12   Lab Results   Component Value Date   TSH 3.396 07/27/2010    I reviewed images myself and agree with interpretation. -VRP  03/01/14 CT HEAD - normal   ASSESSMENT AND PLAN  57 y.o. year old female here with headaches since teenage years, worse in last 3 years. Likely represents migraine without aura. Will try migraine meds. Migraine education reviewed.  PLAN: - TPX + rizatriptan prn  Meds ordered this encounter  Medications  . topiramate (TOPAMAX) 50 MG tablet    Sig: Take 1 tablet (50 mg total) by mouth 2 (two) times daily.    Dispense:  60 tablet    Refill:  12  . rizatriptan (MAXALT-MLT) 10 MG disintegrating tablet    Sig: Take 1 tablet (10 mg total)  by mouth as needed for migraine. May repeat in 2 hours if needed    Dispense:  9 tablet    Refill:  11   Return in about 3 months (around 06/14/2014) for with Charlott Holler or Penumalli.    Penni Bombard, MD 01/12/629, 1:60 PM Certified in Neurology, Neurophysiology and Neuroimaging  [facility] [address], [place], [place] [address] [phone]

## 2014-03-14 NOTE — Patient Instructions (Signed)
Try topiramate 50mg  at bedtime x 2 weeks; then increase to twice a day for migraine prevention.  Try rizatriptan as needed for breakthrough migraine treatment.  Keep track of headaches in a diary/calendar.

## 2014-06-14 ENCOUNTER — Ambulatory Visit: Payer: PRIVATE HEALTH INSURANCE | Admitting: Nurse Practitioner

## 2015-02-12 ENCOUNTER — Other Ambulatory Visit (HOSPITAL_COMMUNITY): Payer: Self-pay | Admitting: Family Medicine

## 2015-02-12 DIAGNOSIS — Z1231 Encounter for screening mammogram for malignant neoplasm of breast: Secondary | ICD-10-CM

## 2015-02-26 ENCOUNTER — Ambulatory Visit (HOSPITAL_COMMUNITY)
Admission: RE | Admit: 2015-02-26 | Discharge: 2015-02-26 | Disposition: A | Discharge status: Home / Self Care | Payer: PRIVATE HEALTH INSURANCE | Source: Ambulatory Visit | Attending: Family Medicine | Admitting: Family Medicine

## 2015-02-26 DIAGNOSIS — Z1231 Encounter for screening mammogram for malignant neoplasm of breast: Secondary | ICD-10-CM | POA: Insufficient documentation

## 2015-05-12 ENCOUNTER — Encounter (INDEPENDENT_AMBULATORY_CARE_PROVIDER_SITE_OTHER): Payer: PRIVATE HEALTH INSURANCE

## 2015-05-12 DIAGNOSIS — R002 Palpitations: Secondary | ICD-10-CM | POA: Diagnosis not present

## 2015-08-27 ENCOUNTER — Emergency Department (HOSPITAL_COMMUNITY): Payer: PRIVATE HEALTH INSURANCE

## 2015-08-27 ENCOUNTER — Emergency Department (HOSPITAL_COMMUNITY)
Admission: EM | Admit: 2015-08-27 | Discharge: 2015-08-27 | Disposition: A | Discharge status: Home / Self Care | Payer: PRIVATE HEALTH INSURANCE | Attending: Emergency Medicine | Admitting: Emergency Medicine

## 2015-08-27 DIAGNOSIS — G8929 Other chronic pain: Secondary | ICD-10-CM | POA: Insufficient documentation

## 2015-08-27 DIAGNOSIS — W108XXA Fall (on) (from) other stairs and steps, initial encounter: Secondary | ICD-10-CM | POA: Insufficient documentation

## 2015-08-27 DIAGNOSIS — W19XXXA Unspecified fall, initial encounter: Secondary | ICD-10-CM

## 2015-08-27 DIAGNOSIS — Z872 Personal history of diseases of the skin and subcutaneous tissue: Secondary | ICD-10-CM | POA: Insufficient documentation

## 2015-08-27 DIAGNOSIS — Y9301 Activity, walking, marching and hiking: Secondary | ICD-10-CM | POA: Diagnosis not present

## 2015-08-27 DIAGNOSIS — Z8619 Personal history of other infectious and parasitic diseases: Secondary | ICD-10-CM | POA: Diagnosis not present

## 2015-08-27 DIAGNOSIS — M47896 Other spondylosis, lumbar region: Secondary | ICD-10-CM | POA: Insufficient documentation

## 2015-08-27 DIAGNOSIS — Z7984 Long term (current) use of oral hypoglycemic drugs: Secondary | ICD-10-CM | POA: Insufficient documentation

## 2015-08-27 DIAGNOSIS — Y998 Other external cause status: Secondary | ICD-10-CM | POA: Diagnosis not present

## 2015-08-27 DIAGNOSIS — G629 Polyneuropathy, unspecified: Secondary | ICD-10-CM | POA: Insufficient documentation

## 2015-08-27 DIAGNOSIS — Z79899 Other long term (current) drug therapy: Secondary | ICD-10-CM | POA: Insufficient documentation

## 2015-08-27 DIAGNOSIS — S8992XA Unspecified injury of left lower leg, initial encounter: Secondary | ICD-10-CM | POA: Diagnosis present

## 2015-08-27 DIAGNOSIS — E119 Type 2 diabetes mellitus without complications: Secondary | ICD-10-CM | POA: Insufficient documentation

## 2015-08-27 DIAGNOSIS — Z8719 Personal history of other diseases of the digestive system: Secondary | ICD-10-CM | POA: Diagnosis not present

## 2015-08-27 DIAGNOSIS — Z794 Long term (current) use of insulin: Secondary | ICD-10-CM | POA: Insufficient documentation

## 2015-08-27 DIAGNOSIS — Y9289 Other specified places as the place of occurrence of the external cause: Secondary | ICD-10-CM | POA: Insufficient documentation

## 2015-08-27 DIAGNOSIS — M25562 Pain in left knee: Secondary | ICD-10-CM

## 2015-08-27 NOTE — ED Notes (Signed)
Pt complains of pain in her left knee since falling down the steps today. Pt states she fell onto her knee from her second step. Pt denies head injury, loss of consciousness. Pt states she took 2 aleves at [facility] for pain. Marland Kitchen

## 2015-08-27 NOTE — ED Provider Notes (Signed)
CSN: [id number]     Arrival date & time 08/27/15  1950 History   First MD Initiated Contact with Patient 08/27/15 2148     Chief Complaint  Patient presents with  . Fall  . Knee Injury     (Consider location/radiation/quality/duration/timing/severity/associated sxs/prior Treatment) HPI   Patient is a 60 year old female with past medical history of arthritis and diabetes who presents to the ED with complaint of left knee pain, onset prior to arrival. Patient reports she was walking downstairs to take out her trash and notes she tripped on the second to last stair resulting in her falling forward and landing on the side of her left knee. Denies head injury or LOC. Patient endorses having a constant dull aching pain to her left knee that is worse with movement. Endorses associated swelling. Denies numbness, tingling, weakness. Patient endorses taking 2 Aleve at home with relief. Denies use of anticoagulants.  Past Medical History  Diagnosis Date  . Peripheral neuropathy     takes Gabapentin  . 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 Chronic back pain   . Osteopenia   . Eczema   . GERD (gastroesophageal reflux disease)     Prevacid prn  . Hemorrhoids   . Diabetes mellitus     takes Metformin daily;takes Humalog takes  8in am .12u at lunch and 15 at dinner.Lantus 35units at bedtime;;Average fasting sugars 100-120  . Herpes    Past Surgical History  Procedure Laterality Date  . Abdominal hysterectomy  10+yrs ago  . Dilation and curettage of uterus  30+yrs ago    x 2  . Cystoscopy      as a teenager  . Tubal ligation    . Colonoscopy  2010  . Shoulder arthroscopy with rotator cuff repair Bilateral    Family History  Problem Relation Age of Onset  . Anesthesia problems Neg Hx   . Hypotension Neg Hx   . Malignant hyperthermia Neg Hx   . Pseudochol deficiency Neg Hx   . Other Mother     brain tumor  . Other Father     gsw complications  . Diabetes Father   . Other Maternal Grandfather      brain tumor   Social History  Substance Use Topics  . Smoking status: Never Smoker   . Smokeless tobacco: Never Used  . Alcohol Use: No   OB History    No data available     Review of Systems  Musculoskeletal: Positive for joint swelling and arthralgias (left knee).  Skin: Negative for wound.  Neurological: Negative for weakness, numbness and headaches.      Allergies  Review of patient's allergies indicates no known allergies.  Home Medications   Prior to Admission medications   Medication Sig Start Date End Date Taking? Authorizing Provider  gabapentin (NEURONTIN) 300 MG capsule Take 300 mg by mouth 2 (two) times daily.    Yes Historical Provider, MD  insulin glargine (LANTUS) 100 UNIT/ML injection Inject 20 Units into the skin at bedtime.    Yes Historical Provider, MD  insulin lispro (HUMALOG) 100 UNIT/ML injection Inject 10-15 Units into the skin 3 (three) times daily before meals.    Yes Historical Provider, MD  naproxen sodium (ANAPROX) 220 MG tablet Take 440 mg by mouth daily as needed (pain).   Yes Historical Provider, MD  sitaGLIPtan-metformin (JANUMET) 50-1000 MG per tablet Take 1 tablet by mouth daily.    Yes Historical Provider, MD  BD PEN  NEEDLE NANO U/F 32G X 4 MM MISC  01/21/14   Historical Provider, MD  methocarbamol (ROBAXIN) 500 MG tablet Take 500 mg by mouth 3 (three) times daily as needed for muscle spasms. Muscle spasm    Historical Provider, MD  NOVOLOG FLEXPEN 100 UNIT/ML FlexPen  12/26/13   Historical Provider, MD  ONE TOUCH ULTRA TEST test strip  02/20/14   Historical Provider, MD  rizatriptan (MAXALT-MLT) 10 MG disintegrating tablet Take 1 tablet (10 mg total) by mouth as needed for migraine. May repeat in 2 hours if needed Patient not taking: Reported on 08/27/2015 03/14/14   Penni Bombard, MD  topiramate (TOPAMAX) 50 MG tablet Take 1 tablet (50 mg total) by mouth 2 (two) times daily. Patient not taking: Reported on 08/27/2015 03/14/14   Penni Bombard, MD   BP 143/57 mmHg  Pulse 92  Temp(Src) 98.1 F (36.7 C) (Oral)  Resp 20  SpO2 100% Physical Exam  Constitutional: She is oriented to person, place, and time. She appears well-developed and well-nourished.  HENT:  Head: Normocephalic and atraumatic.  Eyes: Conjunctivae and EOM are normal. Right eye exhibits no discharge. Left eye exhibits no discharge. No scleral icterus.  Neck: Normal range of motion. Neck supple.  Cardiovascular: Normal rate.   Pulmonary/Chest: Effort normal. No respiratory distress.  Musculoskeletal:       Left knee: She exhibits decreased range of motion (dec flexion due to pain) and effusion. She exhibits no ecchymosis, no deformity, no laceration, no erythema, normal alignment, no LCL laxity, normal patellar mobility and no MCL laxity. Tenderness found. Lateral joint line tenderness noted.  Left knee TTP at lateral and posterior joint line. 2+ PT pulses. Sensation intact.   Neurological: She is alert and oriented to person, place, and time.  Skin: Skin is warm and dry.  Nursing note and vitals reviewed.   ED Course  Procedures (including critical care time) Labs Review Labs Reviewed - No data to display  Imaging Review Dg Knee Complete 4 Views Left  08/27/2015  CLINICAL DATA:  Fall on left knee today coming down stairs, left knee pain and swelling EXAM: LEFT KNEE - COMPLETE 4+ VIEW COMPARISON:  None. FINDINGS: Four views of the left knee submitted. There is no acute fracture or subluxation. No displaced fracture or subluxation. Moderate joint effusion. Spurring of patella. On lateral view there is a subtle lucent line within posterior aspect of patella. This is suspicious for nondisplaced fracture. IMPRESSION: No displaced fracture or subluxation. Question nondisplaced fracture of the patella seen on lateral view posterior aspect. Moderate joint effusion. Electronically Signed   By: Lahoma Crocker M.D.   On: 08/27/2015 20:13   I have personally reviewed  and evaluated these images and lab results as part of my medical decision-making.  Filed Vitals:   08/27/15 1951  BP: 143/57  Pulse: 92  Temp: 98.1 F (36.7 C)  Resp: 20     MDM   Final diagnoses:  Fall, initial encounter  Left knee pain    Patient presented left knee pain and swelling after a mechanical fall. Denies head injury or LOC. VSS. Exam revealed small left knee effusion, tenderness to left lateral and posterior knee, left leg otherwise neurovascular intact. Left knee x-ray revealed no displaced fracture or subluxation, questionable nondisplaced fracture of the patella, moderate joint effusion. Pt placed in knee immobolizer in the ED. Discussed results and plan for d/c with pt. Pt reports she does not want anything for pain and states she  will continue taking advil at home. Plan to discharge patient home with [place] [facility] follow-up. Discussed symptomatic treatment and RICE tx.  Evaluation does not show pathology requring ongoing emergent intervention or admission. Pt is hemodynamically stable and mentating appropriately. Discussed findings/results and plan with patient/guardian, who agrees with plan. All questions answered. Return precautions discussed and outpatient follow up given.      Chesley Noon [place], [place] 08/28/15 0012  Davonna Belling, MD 08/28/15 [phone]

## 2015-08-27 NOTE — Discharge Instructions (Signed)
Continue taking 600mg  Ibuprofen four times daily for pain relief. I also recommend elevating her knee and applying ice for 15-20 minutes 3-4 times daily. Follow-up with the orthopedic office listed above. Return to the emergency department if symptoms worsen or new onset of fever, redness, numbness, tingling, weakness.

## 2015-08-27 NOTE — ED Notes (Signed)
PA at bedside.

## 2015-08-27 NOTE — ED Notes (Signed)
Ortho called 

## 2016-02-13 ENCOUNTER — Other Ambulatory Visit: Payer: Self-pay | Admitting: Family Medicine

## 2016-02-13 DIAGNOSIS — Z1231 Encounter for screening mammogram for malignant neoplasm of breast: Secondary | ICD-10-CM

## 2016-02-27 ENCOUNTER — Ambulatory Visit
Admission: RE | Admit: 2016-02-27 | Discharge: 2016-02-27 | Disposition: A | Discharge status: Home / Self Care | Payer: PRIVATE HEALTH INSURANCE | Source: Ambulatory Visit | Attending: Family Medicine | Admitting: Family Medicine

## 2016-02-27 DIAGNOSIS — Z1231 Encounter for screening mammogram for malignant neoplasm of breast: Secondary | ICD-10-CM

## 2016-07-05 ENCOUNTER — Other Ambulatory Visit: Payer: Self-pay | Admitting: Obstetrics and Gynecology

## 2016-07-05 DIAGNOSIS — N6001 Solitary cyst of right breast: Secondary | ICD-10-CM

## 2016-07-14 ENCOUNTER — Ambulatory Visit
Admission: RE | Admit: 2016-07-14 | Discharge: 2016-07-14 | Disposition: A | Discharge status: Home / Self Care | Payer: PRIVATE HEALTH INSURANCE | Source: Ambulatory Visit | Attending: Obstetrics and Gynecology | Admitting: Obstetrics and Gynecology

## 2016-07-14 DIAGNOSIS — N6001 Solitary cyst of right breast: Secondary | ICD-10-CM

## 2017-01-17 ENCOUNTER — Other Ambulatory Visit: Payer: Self-pay | Admitting: Family Medicine

## 2017-01-17 DIAGNOSIS — Z1231 Encounter for screening mammogram for malignant neoplasm of breast: Secondary | ICD-10-CM

## 2017-02-28 ENCOUNTER — Ambulatory Visit
Admission: RE | Admit: 2017-02-28 | Discharge: 2017-02-28 | Disposition: A | Discharge status: Home / Self Care | Payer: No Typology Code available for payment source | Source: Ambulatory Visit | Attending: Family Medicine | Admitting: Family Medicine

## 2017-02-28 DIAGNOSIS — Z1231 Encounter for screening mammogram for malignant neoplasm of breast: Secondary | ICD-10-CM

## 2017-03-01 ENCOUNTER — Other Ambulatory Visit: Payer: Self-pay | Admitting: Family Medicine

## 2017-03-01 DIAGNOSIS — R928 Other abnormal and inconclusive findings on diagnostic imaging of breast: Secondary | ICD-10-CM

## 2017-03-08 ENCOUNTER — Ambulatory Visit
Admission: RE | Admit: 2017-03-08 | Discharge: 2017-03-08 | Disposition: A | Discharge status: Home / Self Care | Payer: No Typology Code available for payment source | Source: Ambulatory Visit | Attending: Family Medicine | Admitting: Family Medicine

## 2017-03-08 ENCOUNTER — Other Ambulatory Visit: Payer: Self-pay | Admitting: Family Medicine

## 2017-03-08 DIAGNOSIS — R928 Other abnormal and inconclusive findings on diagnostic imaging of breast: Secondary | ICD-10-CM

## 2017-03-08 DIAGNOSIS — N6320 Unspecified lump in the left breast, unspecified quadrant: Secondary | ICD-10-CM

## 2017-03-10 ENCOUNTER — Ambulatory Visit
Admission: RE | Admit: 2017-03-10 | Discharge: 2017-03-10 | Disposition: A | Discharge status: Home / Self Care | Payer: No Typology Code available for payment source | Source: Ambulatory Visit | Attending: Family Medicine | Admitting: Family Medicine

## 2017-03-10 ENCOUNTER — Other Ambulatory Visit: Payer: Self-pay | Admitting: Family Medicine

## 2017-03-10 DIAGNOSIS — N6320 Unspecified lump in the left breast, unspecified quadrant: Secondary | ICD-10-CM

## 2017-03-18 ENCOUNTER — Ambulatory Visit: Payer: Self-pay | Admitting: Surgery

## 2017-03-18 ENCOUNTER — Other Ambulatory Visit: Payer: Self-pay | Admitting: Surgery

## 2017-03-18 DIAGNOSIS — C50412 Malignant neoplasm of upper-outer quadrant of left female breast: Secondary | ICD-10-CM | POA: Insufficient documentation

## 2017-03-18 DIAGNOSIS — C50912 Malignant neoplasm of unspecified site of left female breast: Secondary | ICD-10-CM

## 2017-03-18 DIAGNOSIS — Z170 Estrogen receptor positive status [ER+]: Secondary | ICD-10-CM

## 2017-03-18 NOTE — H&P (Signed)
History of Present Illness Breanna Pratt. Breanna Bobst MD; 03/18/2017 12:34 PM) The patient is a 61 year old female who presents with breast cancer. This is a 61 year old female who presents after recent routine screening mammogram. She had a suspicious finding in her left breast that was further evaluated with mammogram and ultrasound. At 2:00 in the left breast 1 cm from the nipple just below skin there is a 1.4 x 1.2 cm irregular mass. Skin involvement cannot be excluded. Biopsy was performed under ultrasound guidance with clip placement. 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 The patient is now referred for surgical evaluation.  The patient has no family history of breast cancer. Her mother did have ovarian cancer.  Menarche age 23 First pregnancy age 40 Breast-feed no She did take hormones for 5 years after her hysterectomy which was performed around age 10. She did have a TAH/BSO.  Diagnosis Breast, left, needle core biopsy, 2:00 o'clock, 1CMFN - INVASIVE MAMMARY CARCINOMA - SEE COMMENT Microscopic Comment The biopsy material shows an infiltrative proliferation of cells with enlarged hyperchromatic nuclei with inconspicuous nucleoli, arranged linearly . Based on the biopsy, the carcinoma appears Nottingham grade 2 of 3 and measures 0.5 cm in greatest linear extent. E-cadherin and prognostic markers (ER/PR/ki-67/HER2-FISH)are pending and will be reported in an addendum. Dr. Tresa Moore reviewed the case and agrees with the above diagnosis. This case was called to [facility] on March 11, 2017. [name] Pathologist, Electronic Signature (Case signed 03/11/2017) ADDITIONAL INFORMATION: PROGNOSTIC INDICATORS Results: IMMUNOHISTOCHEMICAL AND MORPHOMETRIC ANALYSIS PERFORMED MANUALLY Estrogen Receptor: 100%, POSITIVE, STRONG STAINING INTENSITY Progesterone Receptor: 100%, POSITIVE, STRONG STAINING  INTENSITY Proliferation Marker Ki67: 15% REFERENCE RANGE ESTROGEN RECEPTOR NEGATIVE 0% POSITIVE =>1% REFERENCE RANGE PROGESTERONE RECEPTOR NEGATIVE 0% POSITIVE =>1% All controls stained appropriately [name] Pathologist, Electronic Signature ( Signed 03/15/2017) FLUORESCENCE IN-SITU HYBRIDIZATION Results: HER2 - NEGATIVE RATIO OF HER2/CEP17 SIGNALS 1.51 AVERAGE HER2 COPY NUMBER PER CELL 3.40 Reference Range: NEGATIVE HER2/CEP17 Ratio <2.0 and average HER2 copy number <4.0 EQUIVOCAL HER2/CEP17 Ratio <2.0 and average HER2 copy number >=4.0 and <6.0 1 of 3 FINAL for Breanna Pratt [phone]) ADDITIONAL INFORMATION:(continued) POSITIVE HER2/CEP17 Ratio >=2.0 or <2.0 and average HER2 copy number >=6.0 [name] Pathologist, Electronic Signature ( Signed 03/14/2017) E-cadherin is negative supporting lobular origin. [name]  CLINICAL DATA: Screening.  EXAM: DIGITAL SCREENING BILATERAL MAMMOGRAM WITH CAD  COMPARISON: Previous exam(s).  ACR Breast Density Category b: There are scattered areas of fibroglandular density.  FINDINGS: In the left breast, a possible mass warrants further evaluation. In the right breast, no findings suspicious for malignancy.  Images were processed with CAD.  IMPRESSION: Further evaluation is suggested for possible mass in the left breast.  RECOMMENDATION: Diagnostic mammogram and possibly ultrasound of the left breast. (Code:[id number])  The patient will be contacted regarding the findings, and additional imaging will be scheduled.  BI-RADS CATEGORY 0: Incomplete. Need additional imaging evaluation and/or prior mammograms for comparison.   Electronically Signed By: [name] On: 02/28/2017 17:53  CLINICAL DATA: Screening recall for a possible left breast mass.  EXAM: 2D DIGITAL DIAGNOSTIC UNILATERAL LEFT MAMMOGRAM WITH CAD AND ADJUNCT TOMO  LEFT BREAST ULTRASOUND  COMPARISON: Previous  exam(s).  ACR Breast Density Category b: There are scattered areas of fibroglandular density.  FINDINGS: In the retroareolar left breast there is a new ill-defined mass with indistinct margins.  Mammographic images were processed with CAD.  Physical exam of the left breast demonstrates a firm palpable mass adjacent to the nipple in the upper-outer left breast.  At 2 o'clock, 1 cm from the nipple in the left breast there is an irregular hypoechoic vascular mass immediately deep to the skin surface measuring 1.4 x 1.1 x 1.2 cm. The mass is so close to the skin, skin involvement cannot be excluded. Ultrasound of the left axilla demonstrates multiple normal-appearing lymph nodes.  IMPRESSION: 1. The mass in the left breast at 2 o'clock is highly suspicious for malignancy. The mass is supposed to the skin that skin involvement cannot be excluded.  2. No evidence of left axillary lymphadenopathy.  RECOMMENDATION: 1. Ultrasound-guided biopsy is recommended which has been scheduled for 03/10/2017 at 3:45 p.m.  2. Due to the question of skin involvement, MRI could be performed for further evaluation if clinically indicated.  I have discussed the findings and recommendations with the patient. Results were also provided in writing at the conclusion of the visit. If applicable, a reminder letter will be sent to the patient regarding the next appointment.  BI-RADS CATEGORY 5: Highly suggestive of malignancy.   Electronically Signed By: [name]. On: 03/08/2017 09:40  CLINICAL DATA: Ultrasound-guided core needle biopsy is recommended of a suspicious and palpable mass in the 2 o'clock position of the left breast 1 cm from the nipple.  EXAM: ULTRASOUND GUIDED LEFT BREAST CORE NEEDLE BIOPSY  COMPARISON: Previous exam(s).  FINDINGS: I met with the patient and we discussed the procedure of ultrasound-guided biopsy, including benefits and alternatives.  We discussed the high likelihood of a successful procedure. We discussed the risks of the procedure, including infection, bleeding, tissue injury, clip migration, and inadequate sampling. Informed written consent was given. The usual time-out protocol was performed immediately prior to the procedure.  Lesion quadrant: Upper outer quadrant  Using sterile technique and 1% Lidocaine as local anesthetic, under direct ultrasound visualization, a 12 gauge spring-loaded device was used to perform biopsy of a suspicious irregular mass at 2 o'clock position of the left breast using a lateral to medial approach. At the conclusion of the procedure a ribbon shaped tissue marker clip was deployed into the biopsy cavity. Follow up 2 view mammogram was performed and dictated separately.  IMPRESSION: Ultrasound guided biopsy of the left breast. No apparent complications.   Electronically Signed By: Curlene Dolphin M.D. On: 03/10/2017 16:33  CLINICAL DATA: Ultrasound-guided biopsy was performed of a left breast mass 2 o'clock left breast 1 cm from the nipple.  EXAM: DIAGNOSTIC LEFT MAMMOGRAM POST ULTRASOUND BIOPSY  COMPARISON: Previous exam(s).  FINDINGS: Mammographic images were obtained following ultrasound guided biopsy of a suspicious mass in the 2 o'clock left breast 1 cm from the nipple. A ribbon shaped biopsy clip is satisfactorily positioned within the mass.  IMPRESSION: Satisfactory position of ribbon shaped biopsy clip.  Final Assessment: Post Procedure Mammograms for Marker Placement   Electronically Signed By: Curlene Dolphin M.D. On: 03/10/2017 16:32     Past Surgical History Malachy Moan, RMA; 03/18/2017 11:22 AM) Breast Biopsy Left. Shoulder Surgery Bilateral.  Diagnostic Studies History Malachy Moan, RMA; 03/18/2017 11:22 AM) Colonoscopy 1-5 years ago Mammogram within last year  Allergies Malachy Moan, RMA; 03/18/2017 11:22 AM) No Known  Allergies 03/18/2017  Medication History Malachy Moan, RMA; 03/18/2017 11:25 AM) Gabapentin (300MG Tablet, Oral) Active. Lantus SoloStar (100UNIT/ML Solution, Subcutaneous) Active. HumaLOG (100UNIT/ML Solution, Subcutaneous) Active. Methocarbamol (500MG Tablet, Oral) Active. Naproxen Sodium (220MG Tablet, Oral) Active. Janumet (50-1000MG Tablet, Oral) Active. Medications  Reconciled  Social History Malachy Moan, [place]; 03/18/2017 11:22 AM) Caffeine use Carbonated beverages, Coffee. No alcohol use No drug use Tobacco use Never smoker.  Family History Malachy Moan, [place]; 03/18/2017 11:22 AM) Arthritis Mother. Cervical Cancer Mother. Diabetes Mellitus Brother, Father. Hypertension Brother, Mother. Migraine Headache Brother, Mother. Ovarian Cancer Mother. Thyroid problems Mother.  Pregnancy / Birth History Malachy Moan, [place]; 03/18/2017 11:22 AM) Age of menopause <45 Gravida 2 Irregular periods Maternal age 70-25 Para 1  Other Problems Malachy Moan, [place]; 03/18/2017 11:22 AM) Arthritis Back Pain Breast Cancer Diabetes Mellitus Migraine Headache     Review of Systems Malachy Moan RMA; 03/18/2017 11:22 AM) General Present- Night Sweats. Not Present- Appetite Loss, Chills, Fatigue, Fever, Weight Gain and Weight Loss. Skin Present- Dryness. Not Present- Change in Wart/Mole, Hives, Jaundice, New Lesions, Non-Healing Wounds, Rash and Ulcer. HEENT Present- Seasonal Allergies, Sinus Pain and Wears glasses/contact lenses. Not Present- Earache, Hearing Loss, Hoarseness, Nose Bleed, Oral Ulcers, Ringing in the Ears, Sore Throat, Visual Disturbances and Yellow Eyes. Respiratory Present- Difficulty Breathing. Not Present- Bloody sputum, Chronic Cough, Snoring and Wheezing. Breast Present- Breast Mass. Not Present- Breast Pain, Nipple Discharge and Skin Changes. Cardiovascular Present- Leg Cramps and Shortness of Breath. Not Present- Chest  Pain, Difficulty Breathing Lying Down, Palpitations, Rapid Heart Rate and Swelling of Extremities. Musculoskeletal Present- Back Pain and Joint Pain. Not Present- Joint Stiffness, Muscle Pain, Muscle Weakness and Swelling of Extremities. Neurological Present- Headaches. Not Present- Decreased Memory, Fainting, Numbness, Seizures, Tingling, Tremor, Trouble walking and Weakness. Endocrine Present- Hot flashes. Not Present- Cold Intolerance, Excessive Hunger, Hair Changes, Heat Intolerance and New Diabetes.  Vitals Malachy Moan RMA; 03/18/2017 11:25 AM) 03/18/2017 11:25 AM Weight: 128.8 lb Height: 62in Body Surface Area: 1.59 m Body Mass Index: 23.56 kg/m  Temp.: 32F  Pulse: 69 (Regular)  BP: 110/72 (Sitting, Left Arm, Standard)      Physical Exam Rodman Key K. Hanan Moen MD; 03/18/2017 12:35 PM)  The physical exam findings are as follows: Note:WDWN in NAD Eyes: Pupils equal, round; sclera anicteric HENT: Oral mucosa moist; good dentition Neck: No masses palpated, no thyromegaly Lungs: CTA bilaterally; normal respiratory effort Breasts: symmetric; no nipple retraction or discharge; bilateral fibrocystic changes; no palpable lymphadenopathy on either side; no masses right breast Left breast 2:00 just outside areola - indistinct 1.5 cm palpable firm mass CV: Regular rate and rhythm; no murmurs; extremities well-perfused with no edema Abd: +bowel sounds, soft, non-tender, no palpable organomegaly; no palpable hernias Skin: Warm, dry; no sign of jaundice Psychiatric - alert and oriented x 4; calm mood and affect    Assessment & Plan Rodman Key K. 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 The surgical procedure has been discussed with the patient. Potential risks, benefits, alternative treatments, and  expected outcomes have been explained. All of the patient's questions at this time have been answered. The likelihood of reaching the patient's treatment goal is good. The patient understand the proposed surgical procedure and wishes to proceed.   We will schedule this depending on the outcome of the breast MRI.  MRI, BOTH BREASTS (53614) Note:We will first obtain an MRI to delineate the extent of the primary tumor. We will determine whether there is involvement of the dermis. If the MRI confirms that there is no dermal involvement and we will proceed with scheduling surgery.  I spent approximately 45  minutes with the patient and her daughter discussing her disease and the surgical options for treatment. We discussed mastectomy with or without reconstruction versus breast conserving therapy. We discussed radioactive seed localized lumpectomy as well as sentinel lymph node biopsy followed by radiation. I answered all of their questions to the best of my ability. They seem to understand and agree with the plan. We will contact him by phone after the MRI is complete. We will go ahead and put in oncology and radiation oncology referrals to begin the process but there is no need for them to be seen prior to her surgery.   Breanna Pratt. Georgette Dover, MD, [facility] Surgery  General/ Trauma Surgery  03/18/2017 12:37 PM

## 2017-03-23 ENCOUNTER — Ambulatory Visit
Admission: RE | Admit: 2017-03-23 | Discharge: 2017-03-23 | Disposition: A | Discharge status: Home / Self Care | Payer: No Typology Code available for payment source | Source: Ambulatory Visit | Attending: Surgery | Admitting: Surgery

## 2017-03-23 DIAGNOSIS — C50912 Malignant neoplasm of unspecified site of left female breast: Secondary | ICD-10-CM

## 2017-03-23 MED ORDER — GADOBENATE DIMEGLUMINE 529 MG/ML IV SOLN
12.0000 mL | Freq: Once | INTRAVENOUS | Status: AC | PRN
  Administered 2017-03-23: 12 mL via INTRAVENOUS

## 2017-03-24 ENCOUNTER — Encounter: Payer: Self-pay | Admitting: Hematology and Oncology

## 2017-03-24 ENCOUNTER — Telehealth: Payer: Self-pay | Admitting: Hematology and Oncology

## 2017-03-24 NOTE — Progress Notes (Signed)
Please call the patient and let them know that their MRI looked good.  There is no skin involvement.  I think you have orders, so you can go ahead and get her scheduled.  Let me know if there are any questions.

## 2017-03-24 NOTE — Telephone Encounter (Signed)
Appt has been scheduled for the pt to see Dr. Lindi Adie on 9/4 at 1pm. Left the pt a vm with the appt date and time. Letter mailed.

## 2017-03-25 ENCOUNTER — Other Ambulatory Visit: Payer: Self-pay | Admitting: Surgery

## 2017-03-25 DIAGNOSIS — C50912 Malignant neoplasm of unspecified site of left female breast: Secondary | ICD-10-CM

## 2017-03-29 ENCOUNTER — Telehealth: Payer: Self-pay | Admitting: Hematology and Oncology

## 2017-03-29 NOTE — Telephone Encounter (Signed)
Pt cld to reschedule appt with Dr. Lindi Adie bc she's having surgery on 8/28. Rescheduled to 9/13 at 815am. Pt preferred morning appts.

## 2017-03-31 ENCOUNTER — Encounter (HOSPITAL_COMMUNITY): Payer: Self-pay | Admitting: *Deleted

## 2017-04-01 ENCOUNTER — Encounter (HOSPITAL_BASED_OUTPATIENT_CLINIC_OR_DEPARTMENT_OTHER)
Admission: RE | Admit: 2017-04-01 | Discharge: 2017-04-01 | Disposition: A | Discharge status: Home / Self Care | Payer: No Typology Code available for payment source | Source: Ambulatory Visit | Attending: Surgery | Admitting: Surgery

## 2017-04-01 DIAGNOSIS — Z01810 Encounter for preprocedural cardiovascular examination: Secondary | ICD-10-CM | POA: Diagnosis present

## 2017-04-01 DIAGNOSIS — E119 Type 2 diabetes mellitus without complications: Secondary | ICD-10-CM | POA: Insufficient documentation

## 2017-04-01 LAB — BASIC METABOLIC PANEL
Anion gap: 6 (ref 5–15)
BUN: 10 mg/dL (ref 6–20)
CALCIUM: 9 mg/dL (ref 8.9–10.3)
CO2: 29 mmol/L (ref 22–32)
CREATININE: 0.7 mg/dL (ref 0.44–1.00)
Chloride: 106 mmol/L (ref 101–111)
GFR calc non Af Amer: >60 mL/min (ref >60)
Glucose, Bld: 152 mg/dL — ABNORMAL HIGH (ref 65–99)
Potassium: 4.3 mmol/L (ref 3.5–5.1)
SODIUM: 141 mmol/L (ref 135–145)

## 2017-04-01 NOTE — Progress Notes (Signed)
Ensure  Pre surgery drink given with instructions to complete by 0630 dos, pt verbalized understanding. 

## 2017-04-04 ENCOUNTER — Encounter (HOSPITAL_BASED_OUTPATIENT_CLINIC_OR_DEPARTMENT_OTHER): Payer: Self-pay | Admitting: Emergency Medicine

## 2017-04-04 ENCOUNTER — Ambulatory Visit
Admission: RE | Admit: 2017-04-04 | Discharge: 2017-04-04 | Disposition: A | Discharge status: Home / Self Care | Payer: No Typology Code available for payment source | Source: Ambulatory Visit | Attending: Surgery | Admitting: Surgery

## 2017-04-04 DIAGNOSIS — C50912 Malignant neoplasm of unspecified site of left female breast: Secondary | ICD-10-CM

## 2017-04-04 NOTE — Progress Notes (Signed)
Notified from [facility] that pt's blood sugar 531, pt received 12units of insulin then rechecked blood sugar- 394.  Pt did not receive seed due to this issue.  They will refer pt to physician who manages diabetes. They will call Dr. Vonna Kotyk office with info.

## 2017-04-05 ENCOUNTER — Ambulatory Visit
Admission: RE | Admit: 2017-04-05 | Discharge: 2017-04-05 | Disposition: A | Discharge status: Home / Self Care | Payer: No Typology Code available for payment source | Source: Ambulatory Visit | Attending: Surgery | Admitting: Surgery

## 2017-04-05 ENCOUNTER — Ambulatory Visit (HOSPITAL_COMMUNITY): Payer: No Typology Code available for payment source

## 2017-04-05 ENCOUNTER — Ambulatory Visit (HOSPITAL_COMMUNITY)
Admission: RE | Admit: 2017-04-05 | Payer: No Typology Code available for payment source | Source: Ambulatory Visit | Admitting: Surgery

## 2017-04-05 DIAGNOSIS — Z01812 Encounter for preprocedural laboratory examination: Secondary | ICD-10-CM | POA: Diagnosis not present

## 2017-04-05 DIAGNOSIS — Z9889 Other specified postprocedural states: Secondary | ICD-10-CM | POA: Diagnosis not present

## 2017-04-05 DIAGNOSIS — E119 Type 2 diabetes mellitus without complications: Secondary | ICD-10-CM | POA: Diagnosis not present

## 2017-04-05 DIAGNOSIS — G8929 Other chronic pain: Secondary | ICD-10-CM | POA: Diagnosis not present

## 2017-04-05 DIAGNOSIS — Z79899 Other long term (current) drug therapy: Secondary | ICD-10-CM | POA: Diagnosis not present

## 2017-04-05 DIAGNOSIS — C50912 Malignant neoplasm of unspecified site of left female breast: Secondary | ICD-10-CM | POA: Diagnosis not present

## 2017-04-05 DIAGNOSIS — Z90710 Acquired absence of both cervix and uterus: Secondary | ICD-10-CM | POA: Diagnosis not present

## 2017-04-05 DIAGNOSIS — Z9851 Tubal ligation status: Secondary | ICD-10-CM | POA: Diagnosis not present

## 2017-04-05 DIAGNOSIS — Z794 Long term (current) use of insulin: Secondary | ICD-10-CM | POA: Diagnosis not present

## 2017-04-05 DIAGNOSIS — M549 Dorsalgia, unspecified: Secondary | ICD-10-CM | POA: Diagnosis not present

## 2017-04-05 DIAGNOSIS — Z833 Family history of diabetes mellitus: Secondary | ICD-10-CM | POA: Diagnosis not present

## 2017-04-05 SURGERY — BREAST LUMPECTOMY WITH RADIOACTIVE SEED AND SENTINEL LYMPH NODE BIOPSY
Anesthesia: General | Laterality: Left

## 2017-04-06 ENCOUNTER — Encounter (HOSPITAL_BASED_OUTPATIENT_CLINIC_OR_DEPARTMENT_OTHER): Payer: Self-pay | Admitting: *Deleted

## 2017-04-07 ENCOUNTER — Encounter (HOSPITAL_BASED_OUTPATIENT_CLINIC_OR_DEPARTMENT_OTHER)
Admission: RE | Admit: 2017-04-07 | Discharge: 2017-04-07 | Disposition: A | Discharge status: Home / Self Care | Payer: No Typology Code available for payment source | Source: Ambulatory Visit | Attending: Surgery | Admitting: Surgery

## 2017-04-07 DIAGNOSIS — Z9851 Tubal ligation status: Secondary | ICD-10-CM | POA: Insufficient documentation

## 2017-04-07 DIAGNOSIS — Z90710 Acquired absence of both cervix and uterus: Secondary | ICD-10-CM | POA: Insufficient documentation

## 2017-04-07 DIAGNOSIS — Z794 Long term (current) use of insulin: Secondary | ICD-10-CM | POA: Insufficient documentation

## 2017-04-07 DIAGNOSIS — Z01812 Encounter for preprocedural laboratory examination: Secondary | ICD-10-CM | POA: Insufficient documentation

## 2017-04-07 DIAGNOSIS — E119 Type 2 diabetes mellitus without complications: Secondary | ICD-10-CM | POA: Insufficient documentation

## 2017-04-07 DIAGNOSIS — Z9889 Other specified postprocedural states: Secondary | ICD-10-CM | POA: Insufficient documentation

## 2017-04-07 DIAGNOSIS — M549 Dorsalgia, unspecified: Secondary | ICD-10-CM | POA: Insufficient documentation

## 2017-04-07 DIAGNOSIS — G8929 Other chronic pain: Secondary | ICD-10-CM | POA: Insufficient documentation

## 2017-04-07 DIAGNOSIS — C50912 Malignant neoplasm of unspecified site of left female breast: Secondary | ICD-10-CM | POA: Insufficient documentation

## 2017-04-07 DIAGNOSIS — Z833 Family history of diabetes mellitus: Secondary | ICD-10-CM | POA: Insufficient documentation

## 2017-04-07 DIAGNOSIS — Z79899 Other long term (current) drug therapy: Secondary | ICD-10-CM | POA: Insufficient documentation

## 2017-04-07 LAB — BASIC METABOLIC PANEL
ANION GAP: 7 (ref 5–15)
BUN: 15 mg/dL (ref 6–20)
CALCIUM: 9 mg/dL (ref 8.9–10.3)
CO2: 26 mmol/L (ref 22–32)
Chloride: 103 mmol/L (ref 101–111)
Creatinine, Ser: 0.75 mg/dL (ref 0.44–1.00)
Glucose, Bld: 292 mg/dL — ABNORMAL HIGH (ref 65–99)
POTASSIUM: 4.7 mmol/L (ref 3.5–5.1)
SODIUM: 136 mmol/L (ref 135–145)

## 2017-04-08 ENCOUNTER — Ambulatory Visit
Admission: RE | Admit: 2017-04-08 | Discharge: 2017-04-08 | Disposition: A | Discharge status: Home / Self Care | Payer: No Typology Code available for payment source | Source: Ambulatory Visit | Attending: Surgery | Admitting: Surgery

## 2017-04-08 ENCOUNTER — Inpatient Hospital Stay: Admission: RE | Admit: 2017-04-08 | Payer: No Typology Code available for payment source | Source: Ambulatory Visit

## 2017-04-08 ENCOUNTER — Other Ambulatory Visit: Payer: Self-pay | Admitting: Surgery

## 2017-04-08 DIAGNOSIS — C50912 Malignant neoplasm of unspecified site of left female breast: Secondary | ICD-10-CM

## 2017-04-12 ENCOUNTER — Ambulatory Visit (HOSPITAL_COMMUNITY)
Admission: RE | Admit: 2017-04-12 | Discharge: 2017-04-12 | Disposition: A | Discharge status: Home / Self Care | Payer: No Typology Code available for payment source | Source: Ambulatory Visit | Attending: Surgery | Admitting: Surgery

## 2017-04-12 ENCOUNTER — Ambulatory Visit (HOSPITAL_BASED_OUTPATIENT_CLINIC_OR_DEPARTMENT_OTHER): Payer: No Typology Code available for payment source | Admitting: Anesthesiology

## 2017-04-12 ENCOUNTER — Ambulatory Visit: Payer: No Typology Code available for payment source

## 2017-04-12 ENCOUNTER — Encounter (HOSPITAL_BASED_OUTPATIENT_CLINIC_OR_DEPARTMENT_OTHER): Payer: Self-pay | Admitting: *Deleted

## 2017-04-12 ENCOUNTER — Ambulatory Visit
Admission: RE | Admit: 2017-04-12 | Discharge: 2017-04-12 | Disposition: A | Discharge status: Home / Self Care | Payer: No Typology Code available for payment source | Source: Ambulatory Visit | Attending: Surgery | Admitting: Surgery

## 2017-04-12 ENCOUNTER — Encounter (HOSPITAL_BASED_OUTPATIENT_CLINIC_OR_DEPARTMENT_OTHER)
Admission: RE | Disposition: A | Discharge status: Home / Self Care | Payer: Self-pay | Source: Ambulatory Visit | Attending: Surgery

## 2017-04-12 ENCOUNTER — Ambulatory Visit: Payer: No Typology Code available for payment source | Admitting: Radiation Oncology

## 2017-04-12 ENCOUNTER — Ambulatory Visit: Payer: No Typology Code available for payment source | Admitting: Hematology and Oncology

## 2017-04-12 ENCOUNTER — Ambulatory Visit (HOSPITAL_BASED_OUTPATIENT_CLINIC_OR_DEPARTMENT_OTHER)
Admission: RE | Admit: 2017-04-12 | Discharge: 2017-04-12 | Disposition: A | Discharge status: Home / Self Care | Payer: No Typology Code available for payment source | Source: Ambulatory Visit | Attending: Surgery | Admitting: Surgery

## 2017-04-12 DIAGNOSIS — E119 Type 2 diabetes mellitus without complications: Secondary | ICD-10-CM | POA: Insufficient documentation

## 2017-04-12 DIAGNOSIS — I10 Essential (primary) hypertension: Secondary | ICD-10-CM | POA: Insufficient documentation

## 2017-04-12 DIAGNOSIS — Z79899 Other long term (current) drug therapy: Secondary | ICD-10-CM | POA: Diagnosis not present

## 2017-04-12 DIAGNOSIS — C50412 Malignant neoplasm of upper-outer quadrant of left female breast: Secondary | ICD-10-CM | POA: Insufficient documentation

## 2017-04-12 DIAGNOSIS — K219 Gastro-esophageal reflux disease without esophagitis: Secondary | ICD-10-CM | POA: Insufficient documentation

## 2017-04-12 DIAGNOSIS — M1990 Unspecified osteoarthritis, unspecified site: Secondary | ICD-10-CM | POA: Diagnosis not present

## 2017-04-12 DIAGNOSIS — Z8049 Family history of malignant neoplasm of other genital organs: Secondary | ICD-10-CM | POA: Insufficient documentation

## 2017-04-12 DIAGNOSIS — Z8041 Family history of malignant neoplasm of ovary: Secondary | ICD-10-CM | POA: Insufficient documentation

## 2017-04-12 DIAGNOSIS — Z170 Estrogen receptor positive status [ER+]: Secondary | ICD-10-CM | POA: Diagnosis not present

## 2017-04-12 DIAGNOSIS — Z794 Long term (current) use of insulin: Secondary | ICD-10-CM | POA: Insufficient documentation

## 2017-04-12 DIAGNOSIS — C50912 Malignant neoplasm of unspecified site of left female breast: Secondary | ICD-10-CM

## 2017-04-12 HISTORY — PX: BREAST LUMPECTOMY WITH RADIOACTIVE SEED AND SENTINEL LYMPH NODE BIOPSY: SHX6550

## 2017-04-12 LAB — GLUCOSE, CAPILLARY
Glucose-Capillary: 119 mg/dL — ABNORMAL HIGH (ref 65–99)
Glucose-Capillary: 270 mg/dL — ABNORMAL HIGH (ref 65–99)
Glucose-Capillary: 285 mg/dL — ABNORMAL HIGH (ref 65–99)

## 2017-04-12 SURGERY — BREAST LUMPECTOMY WITH RADIOACTIVE SEED AND SENTINEL LYMPH NODE BIOPSY
Anesthesia: General | Site: Breast | Laterality: Left

## 2017-04-12 MED ORDER — METHYLENE BLUE 0.5 % INJ SOLN
INTRAVENOUS | Status: AC
  Filled 2017-04-12: qty 10

## 2017-04-12 MED ORDER — DEXAMETHASONE SODIUM PHOSPHATE 10 MG/ML IJ SOLN
INTRAMUSCULAR | Status: AC
  Filled 2017-04-12: qty 1

## 2017-04-12 MED ORDER — INSULIN ASPART 100 UNIT/ML ~~LOC~~ SOLN
SUBCUTANEOUS | Status: AC
  Filled 2017-04-12: qty 1

## 2017-04-12 MED ORDER — BUPIVACAINE-EPINEPHRINE (PF) 0.25% -1:200000 IJ SOLN
INTRAMUSCULAR | Status: AC
  Filled 2017-04-12: qty 30

## 2017-04-12 MED ORDER — HYDROMORPHONE HCL 1 MG/ML IJ SOLN: 0.2500 mg | INTRAMUSCULAR | Status: DC | PRN

## 2017-04-12 MED ORDER — CHLORHEXIDINE GLUCONATE CLOTH 2 % EX PADS: 6.0000 | MEDICATED_PAD | Freq: Once | CUTANEOUS | Status: DC

## 2017-04-12 MED ORDER — INSULIN ASPART 100 UNIT/ML ~~LOC~~ SOLN
6.0000 [IU] | Freq: Once | SUBCUTANEOUS | Status: AC
  Administered 2017-04-12: 6 [IU] via SUBCUTANEOUS

## 2017-04-12 MED ORDER — CEFAZOLIN SODIUM-DEXTROSE 2-4 GM/100ML-% IV SOLN
2.0000 g | INTRAVENOUS | Status: AC
  Administered 2017-04-12: 2 g via INTRAVENOUS

## 2017-04-12 MED ORDER — SCOPOLAMINE 1 MG/3DAYS TD PT72: 1.0000 | MEDICATED_PATCH | Freq: Once | TRANSDERMAL | Status: DC | PRN

## 2017-04-12 MED ORDER — TECHNETIUM TC 99M SULFUR COLLOID FILTERED
1.0000 | Freq: Once | INTRAVENOUS | Status: AC | PRN
  Administered 2017-04-12: 1 via INTRADERMAL

## 2017-04-12 MED ORDER — ACETAMINOPHEN 500 MG PO TABS
ORAL_TABLET | ORAL | Status: AC
  Filled 2017-04-12: qty 2

## 2017-04-12 MED ORDER — LIDOCAINE 2% (20 MG/ML) 5 ML SYRINGE
INTRAMUSCULAR | Status: AC
  Filled 2017-04-12: qty 5

## 2017-04-12 MED ORDER — ONDANSETRON HCL 4 MG/2ML IJ SOLN
INTRAMUSCULAR | Status: AC
  Filled 2017-04-12: qty 2

## 2017-04-12 MED ORDER — CELECOXIB 400 MG PO CAPS
400.0000 mg | ORAL_CAPSULE | ORAL | Status: AC
  Administered 2017-04-12: 400 mg via ORAL

## 2017-04-12 MED ORDER — 0.9 % SODIUM CHLORIDE (POUR BTL) OPTIME
TOPICAL | Status: DC | PRN
  Administered 2017-04-12: 1000 mL

## 2017-04-12 MED ORDER — CELECOXIB 200 MG PO CAPS
ORAL_CAPSULE | ORAL | Status: AC
  Filled 2017-04-12: qty 2

## 2017-04-12 MED ORDER — ONDANSETRON HCL 4 MG/2ML IJ SOLN
INTRAMUSCULAR | Status: DC | PRN
  Administered 2017-04-12: 4 mg via INTRAVENOUS

## 2017-04-12 MED ORDER — PROPOFOL 10 MG/ML IV BOLUS
INTRAVENOUS | Status: AC
  Filled 2017-04-12: qty 20

## 2017-04-12 MED ORDER — FENTANYL CITRATE (PF) 100 MCG/2ML IJ SOLN
50.0000 ug | INTRAMUSCULAR | Status: DC | PRN
  Administered 2017-04-12: 100 ug via INTRAVENOUS

## 2017-04-12 MED ORDER — BUPIVACAINE-EPINEPHRINE 0.25% -1:200000 IJ SOLN
INTRAMUSCULAR | Status: DC | PRN
  Administered 2017-04-12: 16 mL

## 2017-04-12 MED ORDER — PROPOFOL 10 MG/ML IV BOLUS
INTRAVENOUS | Status: DC | PRN
  Administered 2017-04-12: 130 mg via INTRAVENOUS

## 2017-04-12 MED ORDER — LIDOCAINE 2% (20 MG/ML) 5 ML SYRINGE
INTRAMUSCULAR | Status: DC | PRN
  Administered 2017-04-12: 60 mg via INTRAVENOUS

## 2017-04-12 MED ORDER — DEXAMETHASONE SODIUM PHOSPHATE 10 MG/ML IJ SOLN
INTRAMUSCULAR | Status: DC | PRN
  Administered 2017-04-12: 10 mg via INTRAVENOUS

## 2017-04-12 MED ORDER — SODIUM CHLORIDE 0.9 % IJ SOLN
INTRAVENOUS | Status: DC | PRN
  Administered 2017-04-12: 5 mL via INTRAMUSCULAR

## 2017-04-12 MED ORDER — MIDAZOLAM HCL 2 MG/2ML IJ SOLN
INTRAMUSCULAR | Status: AC
  Filled 2017-04-12: qty 2

## 2017-04-12 MED ORDER — ACETAMINOPHEN 500 MG PO TABS
1000.0000 mg | ORAL_TABLET | ORAL | Status: AC
  Administered 2017-04-12: 1000 mg via ORAL

## 2017-04-12 MED ORDER — BUPIVACAINE-EPINEPHRINE (PF) 0.5% -1:200000 IJ SOLN
INTRAMUSCULAR | Status: DC | PRN
  Administered 2017-04-12: 30 mL

## 2017-04-12 MED ORDER — FENTANYL CITRATE (PF) 100 MCG/2ML IJ SOLN
INTRAMUSCULAR | Status: DC | PRN
  Administered 2017-04-12 (×2): 50 ug via INTRAVENOUS

## 2017-04-12 MED ORDER — SODIUM CHLORIDE 0.9 % IJ SOLN
INTRAMUSCULAR | Status: AC
  Filled 2017-04-12: qty 10

## 2017-04-12 MED ORDER — CEFAZOLIN SODIUM-DEXTROSE 2-4 GM/100ML-% IV SOLN
INTRAVENOUS | Status: AC
  Filled 2017-04-12: qty 100

## 2017-04-12 MED ORDER — LACTATED RINGERS IV SOLN
INTRAVENOUS | Status: DC
  Administered 2017-04-12: 07:00:00 via INTRAVENOUS

## 2017-04-12 MED ORDER — FENTANYL CITRATE (PF) 100 MCG/2ML IJ SOLN
INTRAMUSCULAR | Status: AC
  Filled 2017-04-12: qty 2

## 2017-04-12 MED ORDER — HYDROCODONE-ACETAMINOPHEN 5-325 MG PO TABS: 1.0000 | ORAL_TABLET | ORAL | 0 refills | Status: DC | PRN

## 2017-04-12 MED ORDER — MIDAZOLAM HCL 2 MG/2ML IJ SOLN
1.0000 mg | INTRAMUSCULAR | Status: DC | PRN
  Administered 2017-04-12: 2 mg via INTRAVENOUS

## 2017-04-12 MED ORDER — FENTANYL CITRATE (PF) 100 MCG/2ML IJ SOLN
INTRAMUSCULAR | Status: AC
Start: 2017-04-12 — End: 2017-04-12
  Filled 2017-04-12: qty 2

## 2017-04-12 SURGICAL SUPPLY — 44 items
APPLIER CLIP 9.375 MED OPEN (MISCELLANEOUS) ×3
BENZOIN TINCTURE PRP APPL 2/3 (GAUZE/BANDAGES/DRESSINGS) ×3 IMPLANT
BINDER BREAST MEDIUM (GAUZE/BANDAGES/DRESSINGS) ×3 IMPLANT
BLADE HEX COATED 2.75 (ELECTRODE) ×3 IMPLANT
BLADE SURG 15 STRL LF DISP TIS (BLADE) ×1 IMPLANT
BLADE SURG 15 STRL SS (BLADE) ×2
CANISTER SUCT 1200ML W/VALVE (MISCELLANEOUS) ×3 IMPLANT
CHLORAPREP W/TINT 26ML (MISCELLANEOUS) ×3 IMPLANT
CLIP APPLIE 9.375 MED OPEN (MISCELLANEOUS) ×1 IMPLANT
CLOSURE WOUND 1/2 X4 (GAUZE/BANDAGES/DRESSINGS) ×2
COVER BACK TABLE 60X90IN (DRAPES) ×3 IMPLANT
COVER MAYO STAND STRL (DRAPES) ×3 IMPLANT
COVER PROBE W GEL 5X96 (DRAPES) ×3 IMPLANT
DEVICE DUBIN W/COMP PLATE 8390 (MISCELLANEOUS) ×6 IMPLANT
DRAPE LAPAROSCOPIC ABDOMINAL (DRAPES) ×3 IMPLANT
DRAPE UTILITY XL STRL (DRAPES) ×3 IMPLANT
DRSG TEGADERM 4X4.75 (GAUZE/BANDAGES/DRESSINGS) ×6 IMPLANT
ELECT REM PT RETURN 9FT ADLT (ELECTROSURGICAL) ×3
ELECTRODE REM PT RTRN 9FT ADLT (ELECTROSURGICAL) ×1 IMPLANT
GAUZE SPONGE 4X4 12PLY STRL LF (GAUZE/BANDAGES/DRESSINGS) ×6 IMPLANT
GLOVE BIO SURGEON STRL SZ7 (GLOVE) ×6 IMPLANT
GLOVE BIOGEL PI IND STRL 7.5 (GLOVE) ×1 IMPLANT
GLOVE BIOGEL PI INDICATOR 7.5 (GLOVE) ×2
GOWN STRL REUS W/ TWL LRG LVL3 (GOWN DISPOSABLE) ×2 IMPLANT
GOWN STRL REUS W/TWL LRG LVL3 (GOWN DISPOSABLE) ×4
KIT MARKER MARGIN INK (KITS) ×3 IMPLANT
NDL SAFETY ECLIPSE 18X1.5 (NEEDLE) ×1 IMPLANT
NEEDLE HYPO 18GX1.5 SHARP (NEEDLE) ×2
NEEDLE HYPO 25X1 1.5 SAFETY (NEEDLE) ×6 IMPLANT
NS IRRIG 1000ML POUR BTL (IV SOLUTION) ×3 IMPLANT
PACK BASIN DAY SURGERY FS (CUSTOM PROCEDURE TRAY) ×3 IMPLANT
PENCIL BUTTON HOLSTER BLD 10FT (ELECTRODE) ×3 IMPLANT
SLEEVE SCD COMPRESS KNEE MED (MISCELLANEOUS) ×3 IMPLANT
SPONGE LAP 4X18 X RAY DECT (DISPOSABLE) ×3 IMPLANT
STRIP CLOSURE SKIN 1/2X4 (GAUZE/BANDAGES/DRESSINGS) ×4 IMPLANT
SUT MON AB 4-0 PC3 18 (SUTURE) ×6 IMPLANT
SUT SILK 2 0 SH (SUTURE) IMPLANT
SUT VIC AB 3-0 SH 27 (SUTURE) ×4
SUT VIC AB 3-0 SH 27X BRD (SUTURE) ×2 IMPLANT
SYR CONTROL 10ML LL (SYRINGE) ×6 IMPLANT
TOWEL OR 17X24 6PK STRL BLUE (TOWEL DISPOSABLE) ×3 IMPLANT
TUBE CONNECTING 20'X1/4 (TUBING) ×1
TUBE CONNECTING 20X1/4 (TUBING) ×2 IMPLANT
YANKAUER SUCT BULB TIP NO VENT (SUCTIONS) ×3 IMPLANT

## 2017-04-12 NOTE — Progress Notes (Signed)
Pt with elevated cbg post op of 270, Dr. Ola Spurr made aware, order given for novolog 6uts, given sq in right arm, cbg 30 minutes later 285, Dr. Sabra Heck made aware, okay given for pt to be dc'd home and get back on regular regimen

## 2017-04-12 NOTE — H&P (View-Only) (Signed)
History of Present Illness Breanna Pratt. Breanna Knickerbocker MD; 03/18/2017 12:34 PM) The patient is a 61 year old female who presents with breast cancer. This is a 61 year old female who presents after recent routine screening mammogram. She had a suspicious finding in her left breast that was further evaluated with mammogram and ultrasound. At 2:00 in the left breast 1 cm from the nipple just below skin there is a 1.4 x 1.2 cm irregular mass. Skin involvement cannot be excluded. Biopsy was performed under ultrasound guidance with clip placement. 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 The patient is now referred for surgical evaluation.  The patient has no family history of breast cancer. Her mother did have ovarian cancer.  Menarche age 23 First pregnancy age 40 Breast-feed no She did take hormones for 5 years after her hysterectomy which was performed around age 10. She did have a TAH/BSO.  Diagnosis Breast, left, needle core biopsy, 2:00 o'clock, 1CMFN - INVASIVE MAMMARY CARCINOMA - SEE COMMENT Microscopic Comment The biopsy material shows an infiltrative proliferation of cells with enlarged hyperchromatic nuclei with inconspicuous nucleoli, arranged linearly . Based on the biopsy, the carcinoma appears Nottingham grade 2 of 3 and measures 0.5 cm in greatest linear extent. E-cadherin and prognostic markers (ER/PR/ki-67/HER2-FISH)are pending and will be reported in an addendum. Dr. Tresa Pratt reviewed the case and agrees with the above diagnosis. This case was called to [facility] on March 11, 2017. Breanna Sheller MD Pathologist, Electronic Signature (Case signed 03/11/2017) ADDITIONAL INFORMATION: PROGNOSTIC INDICATORS Results: IMMUNOHISTOCHEMICAL AND MORPHOMETRIC ANALYSIS PERFORMED MANUALLY Estrogen Receptor: 100%, POSITIVE, STRONG STAINING INTENSITY Progesterone Receptor: 100%, POSITIVE, STRONG STAINING  INTENSITY Proliferation Marker Ki67: 15% REFERENCE RANGE ESTROGEN RECEPTOR NEGATIVE 0% POSITIVE =>1% REFERENCE RANGE PROGESTERONE RECEPTOR NEGATIVE 0% POSITIVE =>1% All controls stained appropriately Breanna Cutter MD Pathologist, Electronic Signature ( Signed 03/15/2017) FLUORESCENCE IN-SITU HYBRIDIZATION Results: HER2 - NEGATIVE RATIO OF HER2/CEP17 SIGNALS 1.51 AVERAGE HER2 COPY NUMBER PER CELL 3.40 Reference Range: NEGATIVE HER2/CEP17 Ratio <2.0 and average HER2 copy number <4.0 EQUIVOCAL HER2/CEP17 Ratio <2.0 and average HER2 copy number >=4.0 and <6.0 1 of 3 FINAL for [name], Tavie [phone]) ADDITIONAL INFORMATION:(continued) POSITIVE HER2/CEP17 Ratio >=2.0 or <2.0 and average HER2 copy number >=6.0 Breanna Males MD Pathologist, Electronic Signature ( Signed 03/14/2017) E-cadherin is negative supporting lobular origin. Breanna Sheller MD  CLINICAL DATA: Screening.  EXAM: DIGITAL SCREENING BILATERAL MAMMOGRAM WITH CAD  COMPARISON: Previous exam(s).  ACR Breast Density Category b: There are scattered areas of fibroglandular density.  FINDINGS: In the left breast, a possible mass warrants further evaluation. In the right breast, no findings suspicious for malignancy.  Images were processed with CAD.  IMPRESSION: Further evaluation is suggested for possible mass in the left breast.  RECOMMENDATION: Diagnostic mammogram and possibly ultrasound of the left breast. (Code:[id number])  The patient will be contacted regarding the findings, and additional imaging will be scheduled.  BI-RADS CATEGORY 0: Incomplete. Need additional imaging evaluation and/or prior mammograms for comparison.   Electronically Signed By: Breanna Pratt III M.D On: 02/28/2017 17:53  CLINICAL DATA: Screening recall for a possible left breast mass.  EXAM: 2D DIGITAL DIAGNOSTIC UNILATERAL LEFT MAMMOGRAM WITH CAD AND ADJUNCT TOMO  LEFT BREAST ULTRASOUND  COMPARISON: Previous  exam(s).  ACR Breast Density Category b: There are scattered areas of fibroglandular density.  FINDINGS: In the retroareolar left breast there is a new ill-defined mass with indistinct margins.  Mammographic images were processed with CAD.  Physical exam of the left breast demonstrates a firm palpable mass adjacent to the nipple in the upper-outer left breast.  At 2 o'clock, 1 cm from the nipple in the left breast there is an irregular hypoechoic vascular mass immediately deep to the skin surface measuring 1.4 x 1.1 x 1.2 cm. The mass is so close to the skin, skin involvement cannot be excluded. Ultrasound of the left axilla demonstrates multiple normal-appearing lymph nodes.  IMPRESSION: 1. The mass in the left breast at 2 o'clock is highly suspicious for malignancy. The mass is supposed to the skin that skin involvement cannot be excluded.  2. No evidence of left axillary lymphadenopathy.  RECOMMENDATION: 1. Ultrasound-guided biopsy is recommended which has been scheduled for 03/10/2017 at 3:45 p.m.  2. Due to the question of skin involvement, MRI could be performed for further evaluation if clinically indicated.  I have discussed the findings and recommendations with the patient. Results were also provided in writing at the conclusion of the visit. If applicable, a reminder letter will be sent to the patient regarding the next appointment.  BI-RADS CATEGORY 5: Highly suggestive of malignancy.   Electronically Signed By: Breanna Pratt M.D. On: 03/08/2017 09:40  CLINICAL DATA: Ultrasound-guided core needle biopsy is recommended of a suspicious and palpable mass in the 2 o'clock position of the left breast 1 cm from the nipple.  EXAM: ULTRASOUND GUIDED LEFT BREAST CORE NEEDLE BIOPSY  COMPARISON: Previous exam(s).  FINDINGS: I met with the patient and we discussed the procedure of ultrasound-guided biopsy, including benefits and alternatives.  We discussed the high likelihood of a successful procedure. We discussed the risks of the procedure, including infection, bleeding, tissue injury, clip migration, and inadequate sampling. Informed written consent was given. The usual time-out protocol was performed immediately prior to the procedure.  Lesion quadrant: Upper outer quadrant  Using sterile technique and 1% Lidocaine as local anesthetic, under direct ultrasound visualization, a 12 gauge spring-loaded device was used to perform biopsy of a suspicious irregular mass at 2 o'clock position of the left breast using a lateral to medial approach. At the conclusion of the procedure a ribbon shaped tissue marker clip was deployed into the biopsy cavity. Follow up 2 view mammogram was performed and dictated separately.  IMPRESSION: Ultrasound guided biopsy of the left breast. No apparent complications.   Electronically Signed By: Curlene Dolphin M.D. On: 03/10/2017 16:33  CLINICAL DATA: Ultrasound-guided biopsy was performed of a left breast mass 2 o'clock left breast 1 cm from the nipple.  EXAM: DIAGNOSTIC LEFT MAMMOGRAM POST ULTRASOUND BIOPSY  COMPARISON: Previous exam(s).  FINDINGS: Mammographic images were obtained following ultrasound guided biopsy of a suspicious mass in the 2 o'clock left breast 1 cm from the nipple. A ribbon shaped biopsy clip is satisfactorily positioned within the mass.  IMPRESSION: Satisfactory position of ribbon shaped biopsy clip.  Final Assessment: Post Procedure Mammograms for Marker Placement   Electronically Signed By: Curlene Dolphin M.D. On: 03/10/2017 16:32     Past Surgical History Malachy Moan, RMA; 03/18/2017 11:22 AM) Breast Biopsy Left. Shoulder Surgery Bilateral.  Diagnostic Studies History Malachy Moan, RMA; 03/18/2017 11:22 AM) Colonoscopy 1-5 years ago Mammogram within last year  Allergies Malachy Moan, RMA; 03/18/2017 11:22 AM) No Known  Allergies 03/18/2017  Medication History Malachy Moan, RMA; 03/18/2017 11:25 AM) Gabapentin (300MG Tablet, Oral) Active. Lantus SoloStar (100UNIT/ML Solution, Subcutaneous) Active. HumaLOG (100UNIT/ML Solution, Subcutaneous) Active. Methocarbamol (500MG Tablet, Oral) Active. Naproxen Sodium (220MG Tablet, Oral) Active. Janumet (50-1000MG Tablet, Oral) Active. Medications  Reconciled  Social History Malachy Moan, [place]; 03/18/2017 11:22 AM) Caffeine use Carbonated beverages, Coffee. No alcohol use No drug use Tobacco use Never smoker.  Family History Malachy Moan, [place]; 03/18/2017 11:22 AM) Arthritis Mother. Cervical Cancer Mother. Diabetes Mellitus Brother, Father. Hypertension Brother, Mother. Migraine Headache Brother, Mother. Ovarian Cancer Mother. Thyroid problems Mother.  Pregnancy / Birth History Malachy Moan, [place]; 03/18/2017 11:22 AM) Age of menopause <45 Gravida 2 Irregular periods Maternal age 70-25 Para 1  Other Problems Malachy Moan, [place]; 03/18/2017 11:22 AM) Arthritis Back Pain Breast Cancer Diabetes Mellitus Migraine Headache     Review of Systems Malachy Moan RMA; 03/18/2017 11:22 AM) General Present- Night Sweats. Not Present- Appetite Loss, Chills, Fatigue, Fever, Weight Gain and Weight Loss. Skin Present- Dryness. Not Present- Change in Wart/Mole, Hives, Jaundice, New Lesions, Non-Healing Wounds, Rash and Ulcer. HEENT Present- Seasonal Allergies, Sinus Pain and Wears glasses/contact lenses. Not Present- Earache, Hearing Loss, Hoarseness, Nose Bleed, Oral Ulcers, Ringing in the Ears, Sore Throat, Visual Disturbances and Yellow Eyes. Respiratory Present- Difficulty Breathing. Not Present- Bloody sputum, Chronic Cough, Snoring and Wheezing. Breast Present- Breast Mass. Not Present- Breast Pain, Nipple Discharge and Skin Changes. Cardiovascular Present- Leg Cramps and Shortness of Breath. Not Present- Chest  Pain, Difficulty Breathing Lying Down, Palpitations, Rapid Heart Rate and Swelling of Extremities. Musculoskeletal Present- Back Pain and Joint Pain. Not Present- Joint Stiffness, Muscle Pain, Muscle Weakness and Swelling of Extremities. Neurological Present- Headaches. Not Present- Decreased Memory, Fainting, Numbness, Seizures, Tingling, Tremor, Trouble walking and Weakness. Endocrine Present- Hot flashes. Not Present- Cold Intolerance, Excessive Hunger, Hair Changes, Heat Intolerance and New Diabetes.  Vitals Malachy Moan RMA; 03/18/2017 11:25 AM) 03/18/2017 11:25 AM Weight: 128.8 lb Height: 62in Body Surface Area: 1.59 m Body Mass Index: 23.56 kg/m  Temp.: 32F  Pulse: 69 (Regular)  BP: 110/72 (Sitting, Left Arm, Standard)      Physical Exam Rodman Key K. Handy Mcloud MD; 03/18/2017 12:35 PM)  The physical exam findings are as follows: Note:WDWN in NAD Eyes: Pupils equal, round; sclera anicteric HENT: Oral mucosa moist; good dentition Neck: No masses palpated, no thyromegaly Lungs: CTA bilaterally; normal respiratory effort Breasts: symmetric; no nipple retraction or discharge; bilateral fibrocystic changes; no palpable lymphadenopathy on either side; no masses right breast Left breast 2:00 just outside areola - indistinct 1.5 cm palpable firm mass CV: Regular rate and rhythm; no murmurs; extremities well-perfused with no edema Abd: +bowel sounds, soft, non-tender, no palpable organomegaly; no palpable hernias Skin: Warm, dry; no sign of jaundice Psychiatric - alert and oriented x 4; calm mood and affect    Assessment & Plan Rodman Key K. 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 The surgical procedure has been discussed with the patient. Potential risks, benefits, alternative treatments, and  expected outcomes have been explained. All of the patient's questions at this time have been answered. The likelihood of reaching the patient's treatment goal is good. The patient understand the proposed surgical procedure and wishes to proceed.   We will schedule this depending on the outcome of the breast MRI.  MRI, BOTH BREASTS (53614) Note:We will first obtain an MRI to delineate the extent of the primary tumor. We will determine whether there is involvement of the dermis. If the MRI confirms that there is no dermal involvement and we will proceed with scheduling surgery.  I spent approximately 45  minutes with the patient and her daughter discussing her disease and the surgical options for treatment. We discussed mastectomy with or without reconstruction versus breast conserving therapy. We discussed radioactive seed localized lumpectomy as well as sentinel lymph node biopsy followed by radiation. I answered all of their questions to the best of my ability. They seem to understand and agree with the plan. We will contact him by phone after the MRI is complete. We will go ahead and put in oncology and radiation oncology referrals to begin the process but there is no need for them to be seen prior to her surgery.   Breanna Pratt. Georgette Dover, MD, [facility]  General/ Trauma Surgery  03/18/2017 12:37 PM

## 2017-04-12 NOTE — Discharge Instructions (Signed)
°Post Anesthesia Home Care Instructions ° °Activity: °Get plenty of rest for the remainder of the day. A responsible individual must stay with you for 24 hours following the procedure.  °For the next 24 hours, DO NOT: °-Drive a car °-Operate machinery °-Drink alcoholic beverages °-Take any medication unless instructed by your physician °-Make any legal decisions or sign important papers. ° °Meals: °Start with liquid foods such as gelatin or soup. Progress to regular foods as tolerated. Avoid greasy, spicy, heavy foods. If nausea and/or vomiting occur, drink only clear liquids until the nausea and/or vomiting subsides. Call your physician if vomiting continues. ° °Special Instructions/Symptoms: °Your throat may feel dry or sore from the anesthesia or the breathing tube placed in your throat during surgery. If this causes discomfort, gargle with warm salt water. The discomfort should disappear within 24 hours. ° °If you had a scopolamine patch placed behind your ear for the management of post- operative nausea and/or vomiting: ° °1. The medication in the patch is effective for 72 hours, after which it should be removed.  Wrap patch in a tissue and discard in the trash. Wash hands thoroughly with soap and water. °2. You may remove the patch earlier than 72 hours if you experience unpleasant side effects which may include dry mouth, dizziness or visual disturbances. °3. Avoid touching the patch. Wash your hands with soap and water after contact with the patch. °  ° ° ° ° °[facility] °Office Phone Number [phone] ° °BREAST BIOPSY/ PARTIAL MASTECTOMY: POST OP INSTRUCTIONS ° °Always review your discharge instruction sheet given to you by the facility where your surgery was performed. ° °IF YOU HAVE DISABILITY OR FAMILY LEAVE FORMS, YOU MUST BRING THEM TO THE OFFICE FOR PROCESSING.  DO NOT GIVE THEM TO YOUR DOCTOR. ° °1. A prescription for pain medication may be given to you upon discharge.  Take your  pain medication as prescribed, if needed.  If narcotic pain medicine is not needed, then you may take acetaminophen (Tylenol) or ibuprofen (Advil) as needed. °2. Take your usually prescribed medications unless otherwise directed °3. If you need a refill on your pain medication, please contact your pharmacy.  They will contact our office to request authorization.  Prescriptions will not be filled after 5pm or on week-ends. °4. You should eat very light the first 24 hours after surgery, such as soup, crackers, pudding, etc.  Resume your normal diet the day after surgery. °5. Most patients will experience some swelling and bruising in the breast.  Ice packs and a good support bra will help.  Swelling and bruising can take several days to resolve.  °6. It is common to experience some constipation if taking pain medication after surgery.  Increasing fluid intake and taking a stool softener will usually help or prevent this problem from occurring.  A mild laxative (Milk of Magnesia or Miralax) should be taken according to package directions if there are no bowel movements after 48 hours. °7. Unless discharge instructions indicate otherwise, you may remove your bandages 24-48 hours after surgery, and you may shower at that time.  You may have steri-strips (small skin tapes) in place directly over the incision.  These strips should be left on the skin for 7-10 days.  If your surgeon used skin glue on the incision, you may shower in 24 hours.  The glue will flake off over the next 2-3 weeks.  Any sutures or staples will be removed at the office during your follow-up visit. °  8. ACTIVITIES:  You may resume regular daily activities (gradually increasing) beginning the next day.  Wearing a good support bra or sports bra minimizes pain and swelling.  You may have sexual intercourse when it is comfortable. °a. You may drive when you no longer are taking prescription pain medication, you can comfortably wear a seatbelt, and you can  safely maneuver your car and apply brakes. °b. RETURN TO WORK:  ______________________________________________________________________________________ °9. You should see your doctor in the office for a follow-up appointment approximately two weeks after your surgery.  Your doctor’s nurse will typically make your follow-up appointment when she calls you with your pathology report.  Expect your pathology report 2-3 business days after your surgery.  You may call to check if you do not hear from us after three days. °10. OTHER INSTRUCTIONS: _______________________________________________________________________________________________ _____________________________________________________________________________________________________________________________________ °_____________________________________________________________________________________________________________________________________ °_____________________________________________________________________________________________________________________________________ ° °WHEN TO CALL YOUR DOCTOR: °1. Fever over 101.0 °2. Nausea and/or vomiting. °3. Extreme swelling or bruising. °4. Continued bleeding from incision. °5. Increased pain, redness, or drainage from the incision. ° °The clinic staff is available to answer your questions during regular business hours.  Please don’t hesitate to call and ask to speak to one of the nurses for clinical concerns.  If you have a medical emergency, go to the nearest emergency room or call 911.  A surgeon from [facility] is always on call at the hospital. ° °For further questions, please visit [email or web address]  °

## 2017-04-12 NOTE — Anesthesia Preprocedure Evaluation (Addendum)
Anesthesia Evaluation  Patient identified by MRN, date of birth, ID band Patient awake    Reviewed: Allergy & Precautions, H&P , NPO status , Patient's Chart, lab work & pertinent test results  Airway Mallampati: II  TM Distance: >3 FB Neck ROM: Full    Dental no notable dental hx. (+) Teeth Intact, Dental Advisory Given   Pulmonary neg pulmonary ROS,    Pulmonary exam normal breath sounds clear to auscultation       Cardiovascular negative cardio ROS   Rhythm:Regular Rate:Normal     Neuro/Psych negative neurological ROS  negative psych ROS   GI/Hepatic Neg liver ROS, GERD  Medicated and Controlled,  Endo/Other  diabetes, Insulin Dependent  Renal/GU negative Renal ROS  negative genitourinary   Musculoskeletal  (+) Arthritis , Osteoarthritis,    Abdominal   Peds  Hematology negative hematology ROS (+)   Anesthesia Other Findings   Reproductive/Obstetrics negative OB ROS                            Anesthesia Physical Anesthesia Plan  ASA: III  Anesthesia Plan: General   Post-op Pain Management:  Regional for Post-op pain   Induction: Intravenous  PONV Risk Score and Plan: 4 or greater and Ondansetron, Dexamethasone and Midazolam  Airway Management Planned: LMA  Additional Equipment:   Intra-op Plan:   Post-operative Plan: Extubation in OR  Informed Consent: I have reviewed the patients History and Physical, chart, labs and discussed the procedure including the risks, benefits and alternatives for the proposed anesthesia with the patient or authorized representative who has indicated his/her understanding and acceptance.   Dental advisory given  Plan Discussed with: CRNA  Anesthesia Plan Comments:         Anesthesia Quick Evaluation

## 2017-04-12 NOTE — Transfer of Care (Signed)
Immediate Anesthesia Transfer of Care Note  Patient: Breanna Pratt  Procedure(s) Performed: Procedure(s): LEFT BREAST LUMPECTOMY WITH RADIOACTIVE SEED AND SENTINEL LYMPH NODE BIOPSY WITH BLUE DYE INJECTION (Left)  Patient Location: PACU  Anesthesia Type:General  Level of Consciousness:  sedated, patient cooperative and responds to stimulation  Airway & Oxygen Therapy:Patient Spontanous Breathing and Patient connected to face mask oxgen  Post-op Assessment:  Report given to PACU RN and Post -op Vital signs reviewed and stable  Post vital signs:  Reviewed and stable  Last Vitals:  Vitals:   04/12/17 0916 04/12/17 0917  BP: 140/72   Pulse: 83 80  Resp:  (!) 9  Temp:    SpO2: 765% 465%    Complications: No apparent anesthesia complications

## 2017-04-12 NOTE — Progress Notes (Signed)
Assisted Dr. Oren Bracket with left, ultrasound guided, pectoralis block. Side rails up, monitors on throughout procedure. See vital signs in flow sheet. Tolerated Procedure well.

## 2017-04-12 NOTE — Op Note (Signed)
Pre-op Diagnosis:  Invasive lobular carcinoma - left breast Post-op Diagnosis: same Procedure:  Left radioactive seed localized lumpectomy/ left axillary sentinel lymph node biopsy/ blue dye injection Surgeon:  Arch Methot K. Anesthesia:  GEN - LMA Indications:  This is a 61 year old female who presents after recent routine screening mammogram. She had a suspicious finding in her left breast that was further evaluated with mammogram and ultrasound. At 2:00 in the left breast 1 cm from the nipple just below skin there is a 1.4 x 1.2 cm irregular mass. Skin involvement cannot be excluded. Biopsy was performed under ultrasound guidance with clip placement. 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 The patient was referred for surgical evaluation.  MRI revealed no skin involvement. Seed was placed last Friday and she was injected in the pre-op area for the sentinel node.  Description of procedure: The patient is brought to the operating room placed in supine position on the operating room table. After an adequate level of general anesthesia was obtained, I injected methylene blue dye solution intradermally around her nipple.  I interrogated the left axilla with the Neoprobe and some radioactivity had begun to accumulate.  We changed the Neoprobe settings and identified the seed lateral to her areola.  Her left breast was prepped with ChloraPrep and draped sterile fashion. A timeout was taken to ensure the proper patient and proper procedure. We interrogated the breast with the neoprobe. We made a circumareolar incision around the lateral side of the nipple after infiltrating with 0.25% Marcaine with epi. Dissection was carried down in the breast tissue with cautery. We used the neoprobe to guide [place] towards the radioactive seed. We excised an area of tissue around the radioactive seed 2.5 cm in diameter. The specimen was removed and was  oriented with a paint kit. Specimen mammogram showed the radioactive seed within the specimen. The biopsy clip was not visualized.  I palpated the lumpectomy cavity and there was some firmness in the superior margin.  We excised another 1.5 cm of superior margin and oriented with a paint kit.  Specimen mammogram showed the biopsy clip within this specimen.  The seed may have migrated inferiorly over the last couple of days.  There was no residual firmness within the biopsy cavity.  The two specimens were sent for pathologic examination. There is no residual radioactivity within the biopsy cavity. We inspected carefully for hemostasis. The wound was thoroughly irrigated. The wound was closed with a deep layer of 3-0 Vicryl and a subcuticular layer of 4-0 Monocryl.  We then turned our attention to the axilla.  There was significant radioactivity in the left axilla. I have trailer with 0.25% Marcaine with epinephrine. I made a transverse incision across the axilla. We dissected down through subcutaneous tissue into the axillary tissue. I identified an area of increased radioactivity. We excised what appeared to be a hot blue lymph node. However upon removing specimen, there were 2 lymph nodes within this area. I separated the 2 lymph nodes. The one with a higher radioactivity counts we identified as sentinel lymph node #1. The of the lymph node was sent as sentinel lymph node #2. I examined the axilla again. They were further areas of activity with counts over 200. I located two other hot blue lymph nodes and these were marked as sentinel lymph node #3 and #4. Residual radioactivity was minimal. However I palpated one other lymph node that seemed to be bleeding. We excised  this as well for hemostasis and sent this as an additional lymph node. We irrigated the axilla thoroughly. We inspected for hemostasis. I closed with a deep layer of 3-0 Vicryl and a subcuticular 4-0 Monocryl.   Benzoin Steri-Strips were applied.  The patient was then extubated and brought to the recovery room in stable condition. All sponge, instrument, and needle counts are correct.  [name]. Georgette Dover, MD, [facility]  General/ Trauma Surgery  04/12/2017 9:20 AM

## 2017-04-12 NOTE — Anesthesia Procedure Notes (Signed)
Procedure Name: LMA Insertion Date/Time: 04/12/2017 7:38 AM Performed by: Talbot Grumbling Pre-anesthesia Checklist: Patient identified, Emergency Drugs available, Patient being monitored and Suction available Patient Re-evaluated:Patient Re-evaluated prior to induction Oxygen Delivery Method: Circle system utilized Preoxygenation: Pre-oxygenation with 100% oxygen Induction Type: IV induction Ventilation: Mask ventilation without difficulty LMA: LMA inserted LMA Size: 4.0 Number of attempts: 1 Placement Confirmation: positive ETCO2 and breath sounds checked- equal and bilateral Tube secured with: Tape Dental Injury: Teeth and Oropharynx as per pre-operative assessment

## 2017-04-12 NOTE — Anesthesia Procedure Notes (Addendum)
Anesthesia Regional Block: Pectoralis block   Pre-Anesthetic Checklist: ,, timeout performed, Correct Patient, Correct Site, Correct Laterality, Correct Procedure, Correct Position, site marked, Risks and benefits discussed, pre-op evaluation,  At surgeon's request and post-op pain management  Laterality: Left  Prep: Maximum Sterile Barrier Precautions used, chloraprep       Needles:  Injection technique: Single-shot  Needle Type: Echogenic Stimulator Needle     Needle Length: 9cm  Needle Gauge: 21     Additional Needles:   Procedures: ultrasound guided,,,,,,,,  Narrative:  Start time: 04/12/2017 7:00 AM End time: 04/12/2017 7:10 AM Injection made incrementally with aspirations every 5 mL. Anesthesiologist: Roderic Palau  Additional Notes: 2% Lidocaine skin wheel.

## 2017-04-12 NOTE — Progress Notes (Signed)
Nuc med injection performed by nuc med staff. No additional sedation required. Pt tol well. Will call family to bedside and update/provide emotional support.

## 2017-04-12 NOTE — Interval H&P Note (Signed)
History and Physical Interval Note:  04/12/2017 7:22 AM  Breanna Pratt  has presented today for surgery, with the diagnosis of LEFT BREAST CANCER  The various methods of treatment have been discussed with the patient and family. After consideration of risks, benefits and other options for treatment, the patient has consented to  Procedure(s): LEFT BREAST LUMPECTOMY WITH RADIOACTIVE SEED AND SENTINEL LYMPH NODE BIOPSY WITH BLUE DYE INJECTION (Left) as a surgical intervention .  The patient's history has been reviewed, patient examined, no change in status, stable for surgery.  I have reviewed the patient's chart and labs.  Questions were answered to the patient's satisfaction.     Gurtej Noyola K.

## 2017-04-12 NOTE — Anesthesia Postprocedure Evaluation (Signed)
Anesthesia Post Note  Patient: Breanna Pratt  Procedure(s) Performed: Procedure(s) (LRB): LEFT BREAST LUMPECTOMY WITH RADIOACTIVE SEED AND SENTINEL LYMPH NODE BIOPSY WITH BLUE DYE INJECTION (Left)     Patient location during evaluation: PACU Anesthesia Type: General Level of consciousness: awake and alert Pain management: pain level controlled Vital Signs Assessment: post-procedure vital signs reviewed and stable Respiratory status: spontaneous breathing, nonlabored ventilation and respiratory function stable Cardiovascular status: blood pressure returned to baseline and stable Postop Assessment: no signs of nausea or vomiting Anesthetic complications: no    Last Vitals:  Vitals:   04/12/17 1000 04/12/17 1029  BP: 128/74 125/67  Pulse: 63 66  Resp: 10 16  Temp:  36.6 C  SpO2: 100% 98%    Last Pain:  Vitals:   04/12/17 1029  TempSrc:   PainSc: 0-No pain                 Lynda Rainwater

## 2017-04-13 ENCOUNTER — Encounter (HOSPITAL_BASED_OUTPATIENT_CLINIC_OR_DEPARTMENT_OTHER): Payer: Self-pay | Admitting: Surgery

## 2017-04-20 NOTE — Progress Notes (Addendum)
Location of Breast Cancer:Left Breast upper outer Quadrant  Histology per Pathology Report: 03/10/2017:Breast,left,needle core biopsy:2:00 o'clock,1CMFN-invasive carcinoma  Receptor Status: ER(100%+), PR (100%+ Her2-neu (neg ratio-1.51), Ki-67(15%  Did patient present with symptoms (if so, please note symptoms) or was this found on screening mammography?: Routine screening  Past/Anticipated interventions by surgeon, if any:Dr. Elenora Gamma 04/12/2017 Diagnosis9/4/18 Dr. Georgette Dover, MD, follow up 04/29/17 1. Breast, lumpectomy, Left - FIBROCYSTIC CHANGES INCLUDING APOCRINE METAPLASIA - NO CARCINOMA IDENTIFIED 2. Breast, excision, Left additional superior margin - INVASIVE PLEOMORPHIC LOBULAR CARCINOMA, NOTTINGHAM GRADE 3 OF 3, 1.5 CM- PERINEURAL INVASION AND LYMPHOVASCULAR SPACE INVASION PRESENT- PLEOMORPHIC LOBULAR CARCINOMA IN SITU- MARGINS UNINVOLVED BY CARCINOMA (0.1 CM SUPERIOR, INFERIOR AND MEDIAL MARGINS)- PREVIOUS BIOPSY SITE CHANGES- SEE ONCOLOGY TABLE BELOW 3. Lymph node, sentinel, biopsy, Left axillary #1 - NO CARCINOMA IDENTIFIED IN ONE LYMPH NODE (0/1) - SEE COMMENT 4. Lymph node, sentinel, biopsy, Left axillary #2 - NO CARCINOMA IDENTIFIED IN ONE LYMPH NODE (0/1) - SEE COMMENT 5. Lymph node, sentinel, biopsy, Left axillary #3 - NO CARCINOMA IDENTIFIED IN ONE LYMPH NODE (0/1) - SEE COMMENT 6. Lymph node, sentinel, biopsy, Left axillary #4 - NO CARCINOMA IDENTIFIED IN ONE LYMPH NODE (0/1) - SEE COMMENT 7. Lymph node, sentinel, biopsy, Additional, Left - NO CARCINOMA IDENTIFIED IN ONE LYMPH NODE (0/1)  Past/Anticipated interventions by medical oncology, if any: Chemotherapy :Dr. Lindi Adie 04/20/17 @ 9:15am Lymphedema issues, if any:   NO  Pain issues, if any: mild soreness,tender , incisions well healed axilla and nipple area,   SAFETY ISSUES:  Prior radiation? NO  Pacemaker/ICD? NO  Possible current pregnancy? NO   Mother cervical / ovarian  Cancer,  Allergies: NKA  BP  (!) 148/76 Comment: RFA sitting  Pulse 69   Temp 98.2 F (36.8 C) (Oral)   Resp 20   Wt 130 lb 3.2 oz (59.1 kg)   BMI 23.43 kg/m   Wt Readings from Last 3 Encounters:  04/21/17 130 lb 3.2 oz (59.1 kg)  04/21/17 130 lb 4.8 oz (59.1 kg)  04/12/17 131 lb (59.4 kg)    Rebecca Eaton, RN 04/20/2017,10:26 AM

## 2017-04-21 ENCOUNTER — Encounter: Payer: Self-pay | Admitting: Radiation Oncology

## 2017-04-21 ENCOUNTER — Ambulatory Visit
Admission: RE | Admit: 2017-04-21 | Discharge: 2017-04-21 | Disposition: A | Discharge status: Home / Self Care | Payer: No Typology Code available for payment source | Source: Ambulatory Visit | Attending: Radiation Oncology | Admitting: Radiation Oncology

## 2017-04-21 ENCOUNTER — Telehealth: Payer: Self-pay | Admitting: *Deleted

## 2017-04-21 ENCOUNTER — Ambulatory Visit (HOSPITAL_BASED_OUTPATIENT_CLINIC_OR_DEPARTMENT_OTHER): Payer: No Typology Code available for payment source | Admitting: Hematology and Oncology

## 2017-04-21 VITALS — BP 148/76 | HR 69 | Temp 98.2°F | Resp 20 | Wt 130.2 lb

## 2017-04-21 DIAGNOSIS — Z803 Family history of malignant neoplasm of breast: Secondary | ICD-10-CM | POA: Insufficient documentation

## 2017-04-21 DIAGNOSIS — Z9889 Other specified postprocedural states: Secondary | ICD-10-CM | POA: Diagnosis not present

## 2017-04-21 DIAGNOSIS — G8929 Other chronic pain: Secondary | ICD-10-CM | POA: Diagnosis not present

## 2017-04-21 DIAGNOSIS — Z833 Family history of diabetes mellitus: Secondary | ICD-10-CM | POA: Diagnosis not present

## 2017-04-21 DIAGNOSIS — Z794 Long term (current) use of insulin: Secondary | ICD-10-CM | POA: Insufficient documentation

## 2017-04-21 DIAGNOSIS — M8580 Other specified disorders of bone density and structure, unspecified site: Secondary | ICD-10-CM | POA: Diagnosis not present

## 2017-04-21 DIAGNOSIS — C50912 Malignant neoplasm of unspecified site of left female breast: Secondary | ICD-10-CM

## 2017-04-21 DIAGNOSIS — Z8249 Family history of ischemic heart disease and other diseases of the circulatory system: Secondary | ICD-10-CM | POA: Insufficient documentation

## 2017-04-21 DIAGNOSIS — C50412 Malignant neoplasm of upper-outer quadrant of left female breast: Secondary | ICD-10-CM | POA: Diagnosis not present

## 2017-04-21 DIAGNOSIS — Z170 Estrogen receptor positive status [ER+]: Secondary | ICD-10-CM

## 2017-04-21 DIAGNOSIS — Z90710 Acquired absence of both cervix and uterus: Secondary | ICD-10-CM | POA: Diagnosis not present

## 2017-04-21 DIAGNOSIS — Z79899 Other long term (current) drug therapy: Secondary | ICD-10-CM | POA: Diagnosis not present

## 2017-04-21 DIAGNOSIS — Z510 Encounter for antineoplastic radiation therapy: Secondary | ICD-10-CM | POA: Insufficient documentation

## 2017-04-21 DIAGNOSIS — M1990 Unspecified osteoarthritis, unspecified site: Secondary | ICD-10-CM | POA: Insufficient documentation

## 2017-04-21 DIAGNOSIS — E119 Type 2 diabetes mellitus without complications: Secondary | ICD-10-CM | POA: Insufficient documentation

## 2017-04-21 DIAGNOSIS — C50012 Malignant neoplasm of nipple and areola, left female breast: Secondary | ICD-10-CM

## 2017-04-21 DIAGNOSIS — Z8489 Family history of other specified conditions: Secondary | ICD-10-CM | POA: Diagnosis not present

## 2017-04-21 DIAGNOSIS — K219 Gastro-esophageal reflux disease without esophagitis: Secondary | ICD-10-CM | POA: Insufficient documentation

## 2017-04-21 DIAGNOSIS — M545 Low back pain: Secondary | ICD-10-CM | POA: Insufficient documentation

## 2017-04-21 DIAGNOSIS — Z79891 Long term (current) use of opiate analgesic: Secondary | ICD-10-CM | POA: Diagnosis not present

## 2017-04-21 NOTE — Progress Notes (Signed)
Radiation Oncology         [phone] ________________________________  Name: Breanna Pratt        MRN: [id number]  Date of Service: 04/21/2017 DOB: 1955/09/22  RX:VQMG, Edwyna Shell, MD  Donnie Mesa, MD     REFERRING PHYSICIAN: Donnie Mesa, MD   DIAGNOSIS: The encounter diagnosis was Malignant neoplasm of upper-outer quadrant of left breast in female, estrogen receptor positive ([place]).   HISTORY OF PRESENT ILLNESS: Breanna Pratt is a 61 y.o. female seen at the request of Dr. Georgette Dover for a new diagnosis of left breast cancer. The patient was seen for screening mammogram which revealed a possible mass as well as skin thickening. She subsequently underwent diagnostic imaging and this revealed a 1.4 x 1.2 cm mass in the left breast with possible skin involvement. She underwnet an MRI which reported no skin involvement, and a biopsy on 03/10/17 showed an invasive mammary carcinoma most likely lobular carcinoma, ER/PR positive, HER2 positive, Ki 67 was 15%. She underewent lumpectomy with sentinel node evaluation on 04/12/17 which revealed a grade 3, pleomorphic lobular carcinoma measuring 1.5 cm, and LCIS with perineural invasion and though uninvolved, margins were within 14m of the superior, inferior, and medial edges. No nodal involvement was identified. She comes today to discuss options of radiotherapy and met with Dr. GLindi Adieprior to this appointment.      PREVIOUS RADIATION THERAPY: No   PAST MEDICAL HISTORY:  Past Medical History:  Diagnosis Date  . 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 Chronic back pain   . Diabetes mellitus    takes Metformin daily;takes Humalog takes  8in am .12u at lunch and 15 at dinner.Lantus 35units at bedtime;;Average fasting sugars 100-120  . Eczema   . GERD (gastroesophageal reflux disease)    Prevacid prn  . Hemorrhoids   . Herpes   . Osteopenia   . Peripheral neuropathy    takes Gabapentin       PAST SURGICAL HISTORY: Past Surgical History:  Procedure  Laterality Date  . ABDOMINAL HYSTERECTOMY  10+yrs ago  . BREAST LUMPECTOMY WITH RADIOACTIVE SEED AND SENTINEL LYMPH NODE BIOPSY Left 04/12/2017   Procedure: LEFT BREAST LUMPECTOMY WITH RADIOACTIVE SEED AND SENTINEL LYMPH NODE BIOPSY WITH BLUE DYE INJECTION;  Surgeon: TDonnie Mesa MD;  [facility]  Service: General;  Laterality: Left;  . COLONOSCOPY  2010  . CYSTOSCOPY     as a teenager  . DILATION AND CURETTAGE OF UTERUS  30+yrs ago   x 2  . SHOULDER ARTHROSCOPY WITH ROTATOR CUFF REPAIR Bilateral   . TUBAL LIGATION       FAMILY HISTORY:  Family History  Problem Relation Age of Onset  . Other Mother        brain tumor  . Other Father        gsw complications  . Diabetes Father   . Other Maternal Grandfather        brain tumor  . Anesthesia problems Neg Hx   . Hypotension Neg Hx   . Malignant hyperthermia Neg Hx   . Pseudochol deficiency Neg Hx   . Breast cancer Neg Hx      SOCIAL HISTORY:  reports that she has never smoked. She has never used smokeless tobacco. She reports that she does not drink alcohol or use drugs. 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   MEDICATIONS:  Current Outpatient  Prescriptions  Medication Sig Dispense Refill  . BD PEN NEEDLE NANO U/F 32G X 4 MM MISC     . diclofenac sodium (VOLTAREN) 1 % GEL Apply 1 application topically 2 (two) times daily.    Marland Kitchen gabapentin (NEURONTIN) 300 MG capsule Take 300 mg by mouth 2 (two) times daily.     Marland Kitchen HYDROcodone-acetaminophen (NORCO/VICODIN) 5-325 MG tablet Take 1 tablet by mouth every 4 (four) hours as needed for moderate pain. 30 tablet 0  . insulin lispro (HUMALOG) 100 UNIT/ML injection Inject 10-15 Units into the skin 3 (three) times daily before meals.     . latanoprost (XALATAN) 0.005 % ophthalmic solution Place 1 drop into both eyes at bedtime.    Marland Kitchen loratadine (CLARITIN) 10 MG tablet Take 10 mg by mouth  daily.    Marland Kitchen NOVOLOG FLEXPEN 100 UNIT/ML FlexPen     . ONE TOUCH ULTRA TEST test strip     . sitaGLIPtan-metformin (JANUMET) 50-1000 MG per tablet Take 1 tablet by mouth daily.     . TRESIBA FLEXTOUCH 100 UNIT/ML SOPN FlexTouch Pen Inject 15 Units as directed at bedtime.    . methocarbamol (ROBAXIN) 500 MG tablet Take 500 mg by mouth 3 (three) times daily as needed for muscle spasms. Muscle spasm     No current facility-administered medications for this encounter.      REVIEW OF SYSTEMS: On review of systems, the patient reports that she is doing well overall. She denies any chest pain, shortness of breath, cough, fevers, chills, night sweats, unintended weight changes. She denies any bowel or bladder disturbances, and denies abdominal pain, nausea or vomiting. She denies any new musculoskeletal or joint aches or pains. A complete review of systems is obtained and is otherwise negative.     PHYSICAL EXAM:  Wt Readings from Last 3 Encounters:  04/21/17 130 lb 3.2 oz (59.1 kg)  04/21/17 130 lb 4.8 oz (59.1 kg)  04/12/17 131 lb (59.4 kg)   Temp Readings from Last 3 Encounters:  04/21/17 98.2 F (36.8 C) (Oral)  04/21/17 98.3 F (36.8 C) (Oral)  04/12/17 97.9 F (36.6 C)   BP Readings from Last 3 Encounters:  04/21/17 (!) 148/76  04/21/17 114/66  04/12/17 125/67   Pulse Readings from Last 3 Encounters:  04/21/17 69  04/21/17 63  04/12/17 66   Pain Assessment Pain Score: 2  Pain Loc: Breast (left sore)/10  In general this is a well appearing African American female in no acute distress. She is alert and oriented x4 and appropriate throughout the examination. HEENT reveals that the patient is normocephalic, atraumatic. EOMs are intact. PERRLA. Skin is intact without any evidence of gross lesions. Cardiopulmonary assessment is negative for acute distress and she exhibits normal effort. The left breast incision and axillary incision is intact without induration or erythema. Along  the lateral chest wall at the level of the axilla she has some chest wall edema but no palpable seroma pocket.   ECOG = 1  0 - Asymptomatic (Fully active, able to carry on all predisease activities without restriction)  1 - Symptomatic but completely ambulatory (Restricted in physically strenuous activity but ambulatory and able to carry out work of a light or sedentary nature. For example, light housework, office work)  2 - Symptomatic, <50% in bed during the day (Ambulatory and capable of all self care but unable to carry out any work activities. Up and about more than 50% of waking hours)  3 - Symptomatic, >50% in bed,  but not bedbound (Capable of only limited self-care, confined to bed or chair 50% or more of waking hours)  4 - Bedbound (Completely disabled. Cannot carry on any self-care. Totally confined to bed or chair)  5 - Death   Eustace Pen MM, Creech RH, Tormey DC, et al. [id number]). "Toxicity and response criteria of the [facility]". [place] Oncol. 5 (6): 649-55    LABORATORY DATA:  Lab Results  Component Value Date   WBC 15.7 (H) 10/15/2011   HGB 13.3 10/15/2011   HCT 39.0 10/15/2011   MCV 94.4 10/15/2011   PLT 259 10/15/2011   Lab Results  Component Value Date   NA 136 04/07/2017   K 4.7 04/07/2017   CL 103 04/07/2017   CO2 26 04/07/2017   No results found for: ALT, AST, GGT, ALKPHOS, BILITOT    RADIOGRAPHY: Mr Breast Bilateral W Wo Contrast  Result Date: 03/24/2017 CLINICAL DATA:  61 year old female with recently diagnosed invasive lobular carcinoma of the left breast. LABS:  Creatinine was obtained on site at [facility] at [address] Results: Creatinine 0.6 mg/dL. EXAM: BILATERAL BREAST MRI WITH AND WITHOUT CONTRAST TECHNIQUE: Multiplanar, multisequence MR images of both breasts were obtained prior to and following the intravenous administration of 12 ml of MultiHance. THREE-DIMENSIONAL MR IMAGE RENDERING ON INDEPENDENT  WORKSTATION: Three-dimensional MR images were rendered by post-processing of the original MR data on an independent workstation. The three-dimensional MR images were interpreted, and findings are reported in the following complete MRI report for this study. Three dimensional images were evaluated at the independent DynaCad workstation COMPARISON:  Previous exam(s). FINDINGS: Breast composition: b.  Scattered fibroglandular tissue. Background parenchymal enhancement: Mild. Right breast: No suspicious rapidly enhancing masses or abnormal areas of enhancement in the right breast to suggest malignancy. Left breast: Irregular mass in the retroareolar left breast containing biopsy clip artifact measures 1.7 x 1 x 1.3 cm. Mild flattening of the left nipple is seen however there is approximately 1 cm between the malignancy and skin of the anterior breast with no findings to suggest dermal involvement. No additional masses identified in the left breast. Lymph nodes: No morphologically abnormal axillary lymph nodes. No internal mammary lymphadenopathy. Ancillary findings:  None. IMPRESSION: 1. Biopsy-proven retroareolar left breast malignancy measures up to 1.7 cm. The mass is approximately 1 cm from the skin with no findings to suggest skin involvement. 2. No MRI evidence of malignancy in the right breast. RECOMMENDATION: Treatment plan for known left breast malignancy. BI-RADS CATEGORY  6: Known biopsy-proven malignancy. Electronically Signed   By: Everlean Alstrom M.D.   On: 03/24/2017 11:27   Mm Breast Surgical Specimen  Result Date: 04/12/2017 CLINICAL DATA:  Status post radioactive seed localization for known left breast invasive lobular carcinoma. EXAM: SPECIMEN RADIOGRAPH OF THE LEFT BREAST COMPARISON:  Previous exam(s). FINDINGS: Status post excision of the left breast. The radioactive seed and biopsy marker clip are present into specimen, completely intact, and were marked for pathology. IMPRESSION: Specimen  radiograph of the left breast. Electronically Signed   By: Nolon Nations M.D.   On: 04/12/2017 09:36   [place] Lt Radioactive Seed Loc  Result Date: 04/08/2017 CLINICAL DATA:  Left breast 2 o'clock malignancy. EXAM: ULTRASOUND GUIDED RADIOACTIVE SEED LOCALIZATION OF THE LEFT BREAST COMPARISON:  Previous exam(s). FINDINGS: Patient presents for radioactive seed localization prior to left breast lumpectomy. I met with the patient and we discussed the procedure of seed localization including benefits and alternatives. We discussed  the high likelihood of a successful procedure. We discussed the risks of the procedure including infection, bleeding, tissue injury and further surgery. We discussed the low dose of radioactivity involved in the procedure. Informed, written consent was given. The usual time-out protocol was performed immediately prior to the procedure. Using ultrasound guidance, sterile technique, 1% lidocaine and an I-125 radioactive seed, left breast o'clock mass was localized using a inferior approach. The follow-up mammogram images confirm the seed in the expected location and were marked for Dr. Georgette Dover. Follow-up survey of the patient confirms presence of the radioactive seed. Order number of I-125 seed:  [id number]. Total activity: 6.720 millicurie Reference Date: March 24, 2017 The patient tolerated the procedure well and was released from the [facility]. She was given instructions regarding seed removal. IMPRESSION: Radioactive seed localization left breast. No apparent complications. Electronically Signed   By: Fidela Salisbury M.D.   On: 04/08/2017 12:53   Mm Clip Placement Left  Result Date: 04/08/2017 CLINICAL DATA:  Post radioactive ultrasound-guided seed localization of left breast o'clock mass. EXAM: DIAGNOSTIC LEFT MAMMOGRAM POST ULTRASOUND GUIDED RADIOACTIVE SEED PLACEMENT COMPARISON:  Previous exam(s). FINDINGS: Mammographic images were obtained following ultrasound guided  radioactive seed placement within 2 o'clock left breast subareolar mass. Two-view mammography demonstrates presence of radioactive seed in the anterior margin of the mass. The radioactive seed is located 4 mm anterior to the post biopsy ribbon shaped tissue marker. Expected post procedural changes are seen. IMPRESSION: Successful placement of radioactive seed at left breast 2 o'clock mass, in planning for left breast lumpectomy. Final Assessment: Post Procedure Mammograms for Marker Placement Electronically Signed   By: Fidela Salisbury M.D.   On: 04/08/2017 12:56       IMPRESSION/PLAN: 1. 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 Dr. Lisbeth Renshaw discusses the pathology findings and reviews the nature of invasive lobular disease. She is awaiting oncotype testing for her tumor. We will be in touch regarding these results. If she has chemotherapy, we will plan to see her after completion, however if she does not need chemotherapy, we will proceed with radiation in the next few weeks. We discussed the risks, benefits, short, and long term effects of radiotherapy, and the patient is interested in proceeding. Dr. Lisbeth Renshaw discusses the delivery and logistics of radiotherapy and would offer her a course of 4 weeks. She is in agreement with this plan.   The above documentation reflects my direct findings during this shared patient visit. Please see the separate note by Dr. Lisbeth Renshaw on this date for the remainder of the patient's plan of care.    Carola Rhine, PAC

## 2017-04-21 NOTE — Assessment & Plan Note (Signed)
04/12/2017 Left lumpectomy: Fibrocystic changes; superior margin: Invasive pleomorphic lobular carcinoma grade 3, 1.5 cm, perineural invasion and lymphovascular invasion present, pleomorphic LCIS, margins negative, 0/5 lymph nodes negative, ER 100%, PR 100%, HER-2 negative, Ki-67 15% T1 CN 0 stage IA  Pathology and radiology counseling:Discussed with the patient, the details of pathology including the type of breast cancer,the clinical staging, the significance of ER, PR and HER-2/neu receptors and the implications for treatment. After reviewing the pathology in detail, we proceeded to discuss the different treatment options between surgery, radiation, chemotherapy, antiestrogen therapies.  Recommendations: 1. Oncotype DX testing to determine if chemotherapy would be of any benefit followed by 3. Adjuvant radiation therapy followed by 4. Adjuvant antiestrogen therapy  Oncotype counseling: I discussed Oncotype DX test. I explained to the patient that this is a 21 gene panel to evaluate patient tumors DNA to calculate recurrence score. This would help determine whether patient has high risk or intermediate risk or low risk breast cancer. She understands that if her tumor was found to be high risk, she would benefit from systemic chemotherapy. If low risk, no need of chemotherapy. If she was found to be intermediate risk, we would need to evaluate the score as well as other risk factors and determine if an abbreviated chemotherapy may be of benefit.  Return to clinic based upon Oncotype DX result

## 2017-04-21 NOTE — Progress Notes (Signed)
[facility] NOTE  Patient Care Team: Vernie Shanks, MD as PCP - General (Family Medicine)  CHIEF COMPLAINTS/PURPOSE OF CONSULTATION:  Newly diagnosed breast cancer  HISTORY OF PRESENTING ILLNESS:  Breanna Pratt 61 y.o. female is here because of recent diagnosis of left breast cancer. Patient presented for routine screening mammogram and she was noted to have an abnormality which was further evaluated by ultrasound followed by biopsy. She underwent an MRI of the breasts that revealed a 1.7 cm irregular area of abnormality. The biopsy came back as invasive lobular cancer that was ER/PR positive HER-2 negative with a Ki-67 15%. She then underwent lumpectomy with sentinel lymph node biopsy on 04/12/2017. Final pathology revealed invasive lobular cancer 1.5 cm grade 3 with evidence of lymphovascular and perineural invasion and 5 lymph nodes were negative. She was presented yesterday at the multidisciplinary tumor board and she is here today to discuss adjuvant treatment plan.  I reviewed her records extensively and collaborated the history with the patient.  SUMMARY OF ONCOLOGIC HISTORY:   Invasive lobular carcinoma of breast, stage 1, left ([place])   03/10/2017 Initial Diagnosis    Invasive lobular cancer grade 2, ER 100%, PR 100%, HER-2 negative ratio 1.51, Ki-67 15%      03/23/2017 Breast MRI    Irregular mass in the retroareolar left breast 1.7 x 1 x 1.3 cm, no abnormal lymph nodes, T1 CN 0 stage IA       04/12/2017 Surgery    Left lumpectomy: Fibrocystic changes; superior margin: Invasive pleomorphic lobular carcinoma grade 3, 1.5 cm, perineural invasion and lymphovascular invasion present, pleomorphic LCIS, margins negative, 0/5 lymph nodes negative, ER 100%, PR 100%, HER-2 negative, Ki-67 15% T1 CN 0 stage IA      MEDICAL HISTORY:  Past Medical History:  Diagnosis Date  . 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 Chronic back pain   . Diabetes mellitus    takes Metformin  daily;takes Humalog takes  8in am .12u at lunch and 15 at dinner.Lantus 35units at bedtime;;Average fasting sugars 100-120  . Eczema   . GERD (gastroesophageal reflux disease)    Prevacid prn  . Hemorrhoids   . Herpes   . Osteopenia   . Peripheral neuropathy    takes Gabapentin    SURGICAL HISTORY: Past Surgical History:  Procedure Laterality Date  . ABDOMINAL HYSTERECTOMY  10+yrs ago  . BREAST LUMPECTOMY WITH RADIOACTIVE SEED AND SENTINEL LYMPH NODE BIOPSY Left 04/12/2017   Procedure: LEFT BREAST LUMPECTOMY WITH RADIOACTIVE SEED AND SENTINEL LYMPH NODE BIOPSY WITH BLUE DYE INJECTION;  Surgeon: Donnie Mesa, MD;  Location: [facility];  Service: General;  Laterality: Left;  . COLONOSCOPY  2010  . CYSTOSCOPY     as a teenager  . DILATION AND CURETTAGE OF UTERUS  30+yrs ago   x 2  . SHOULDER ARTHROSCOPY WITH ROTATOR CUFF REPAIR Bilateral   . TUBAL LIGATION      SOCIAL HISTORY: Social History   Social History  . Marital status: Single    Spouse name: N/A  . Number of children: 1  . Years of education: HS   Occupational History  . 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 Smoking status: Never Smoker  . Smokeless tobacco: Never Used  . Alcohol use No  . Drug use: No  . Sexual activity: No   Other Topics Concern  . Not on file  Social History Narrative   Patient lives at home with family.   Caffeine Use; 1-2 cups a week    FAMILY HISTORY: Family History  Problem Relation Age of Onset  . Other Mother        brain tumor  . Other Father        gsw complications  . Diabetes Father   . Other Maternal Grandfather        brain tumor  . Anesthesia problems Neg Hx   . Hypotension Neg Hx   . Malignant hyperthermia Neg Hx   . Pseudochol deficiency Neg Hx   . Breast cancer Neg Hx     ALLERGIES:  has No Known Allergies.  MEDICATIONS:  Current Outpatient Prescriptions  Medication Sig Dispense Refill  .  BD PEN NEEDLE NANO U/F 32G X 4 MM MISC     . gabapentin (NEURONTIN) 300 MG capsule Take 300 mg by mouth 2 (two) times daily.     Marland Kitchen HYDROcodone-acetaminophen (NORCO/VICODIN) 5-325 MG tablet Take 1 tablet by mouth every 4 (four) hours as needed for moderate pain. 30 tablet 0  . insulin glargine (LANTUS) 100 UNIT/ML injection Inject 15 Units into the skin at bedtime.     . insulin lispro (HUMALOG) 100 UNIT/ML injection Inject 10-15 Units into the skin 3 (three) times daily before meals.     Marland Kitchen loratadine (CLARITIN) 10 MG tablet Take 10 mg by mouth daily.    . methocarbamol (ROBAXIN) 500 MG tablet Take 500 mg by mouth 3 (three) times daily as needed for muscle spasms. Muscle spasm    . NOVOLOG FLEXPEN 100 UNIT/ML FlexPen     . ONE TOUCH ULTRA TEST test strip     . sitaGLIPtan-metformin (JANUMET) 50-1000 MG per tablet Take 1 tablet by mouth daily.      No current facility-administered medications for this visit.     REVIEW OF SYSTEMS:   Constitutional: Denies fevers, chills or abnormal night sweats Eyes: Denies blurriness of vision, double vision or watery eyes Ears, nose, mouth, throat, and face: Denies mucositis or sore throat Respiratory: Denies cough, dyspnea or wheezes Cardiovascular: Denies palpitation, chest discomfort or lower extremity swelling Gastrointestinal:  Denies nausea, heartburn or change in bowel habits Skin: Denies abnormal skin rashes Lymphatics: Denies new lymphadenopathy or easy bruising Neurological:Denies numbness, tingling or new weaknesses Behavioral/Psych: Mood is stable, no new changes  Breast: Complaining of swelling underneath the left arm All other systems were reviewed with the patient and are negative.  PHYSICAL EXAMINATION: ECOG PERFORMANCE STATUS: 1 - Symptomatic but completely ambulatory  Vitals:   04/21/17 0812  BP: 114/66  Pulse: 63  Resp: 18  Temp: 98.3 F (36.8 C)  SpO2: 100%   Filed Weights   04/21/17 0812  Weight: 130 lb 4.8 oz (59.1 kg)     GENERAL:alert, no distress and comfortable SKIN: skin color, texture, turgor are normal, no rashes or significant lesions EYES: normal, conjunctiva are pink and non-injected, sclera clear OROPHARYNX:no exudate, no erythema and lips, buccal mucosa, and tongue normal  NECK: supple, thyroid normal size, non-tender, without nodularity LYMPH:  no palpable lymphadenopathy in the cervical, axillary or inguinal LUNGS: clear to auscultation and percussion with normal breathing effort HEART: regular rate & rhythm and no murmurs and no lower extremity edema ABDOMEN:abdomen soft, non-tender and normal bowel sounds Musculoskeletal:no cyanosis of digits and no clubbing  PSYCH: alert & oriented x 3 with fluent speech NEURO: no focal motor/sensory deficits  LABORATORY DATA:  I have reviewed the  data as listed Lab Results  Component Value Date   WBC 15.7 (H) 10/15/2011   HGB 13.3 10/15/2011   HCT 39.0 10/15/2011   MCV 94.4 10/15/2011   PLT 259 10/15/2011   Lab Results  Component Value Date   NA 136 04/07/2017   K 4.7 04/07/2017   CL 103 04/07/2017   CO2 26 04/07/2017    RADIOGRAPHIC STUDIES: I have personally reviewed the radiological reports and agreed with the findings in the report.  ASSESSMENT AND PLAN:  Invasive lobular carcinoma of breast, stage 1, left ([place]) 04/12/2017 Left lumpectomy: Fibrocystic changes; superior margin: Invasive pleomorphic lobular carcinoma grade 3, 1.5 cm, perineural invasion and lymphovascular invasion present, pleomorphic LCIS, margins negative, 0/5 lymph nodes negative, ER 100%, PR 100%, HER-2 negative, Ki-67 15% T1 CN 0 stage IA  Pathology and radiology counseling:Discussed with the patient, the details of pathology including the type of breast cancer,the clinical staging, the significance of ER, PR and HER-2/neu receptors and the implications for treatment. After reviewing the pathology in detail, we proceeded to discuss the different treatment options  between surgery, radiation, chemotherapy, antiestrogen therapies.  Recommendations: 1. Oncotype DX testing to determine if chemotherapy would be of any benefit followed by 3. Adjuvant radiation therapy followed by 4. Adjuvant antiestrogen therapy  Swelling under the arm: I will refer her to physical therapy.  Oncotype counseling: I discussed Oncotype DX test. I explained to the patient that this is a 21 gene panel to evaluate patient tumors DNA to calculate recurrence score. This would help determine whether patient has high risk or intermediate risk or low risk breast cancer. She understands that if her tumor was found to be high risk, she would benefit from systemic chemotherapy. If low risk, no need of chemotherapy. If she was found to be intermediate risk, we would need to evaluate the score as well as other risk factors and determine if an abbreviated chemotherapy may be of benefit.  Return to clinic based upon Oncotype DX result   All questions were answered. The patient knows to call the clinic with any problems, questions or concerns.    Rulon Eisenmenger, MD 04/21/17

## 2017-04-21 NOTE — Progress Notes (Signed)
Please see the Nurse Progress Note in the MD Initial Consult Encounter for this patient. 

## 2017-04-21 NOTE — Telephone Encounter (Signed)
Ordered oncotype per Dr. Gudena.  Faxed requisition to pathology and confirmed receipt. 

## 2017-04-25 ENCOUNTER — Encounter: Payer: Self-pay | Admitting: Physical Therapy

## 2017-04-25 ENCOUNTER — Ambulatory Visit: Payer: No Typology Code available for payment source | Attending: Hematology and Oncology | Admitting: Physical Therapy

## 2017-04-25 DIAGNOSIS — R600 Localized edema: Secondary | ICD-10-CM | POA: Diagnosis present

## 2017-04-25 DIAGNOSIS — Z483 Aftercare following surgery for neoplasm: Secondary | ICD-10-CM

## 2017-04-25 NOTE — Therapy (Signed)
[place], [place], [address] Phone: [phone]   Fax:  [phone]  Physical Therapy Evaluation  Patient Details  Name: Breanna Pratt MRN: [id number] Date of Birth: 1956-04-13 Referring Provider: Lindi Adie  Encounter Date: 04/25/2017      PT End of Session - 04/25/17 1643    Visit Number 1   Number of Visits 9   Date for PT Re-Evaluation 05/23/17   PT Start Time 1601   PT Stop Time 1639   PT Time Calculation (min) 38 min   Activity Tolerance Patient tolerated treatment well   Behavior During Therapy [facility] for tasks assessed/performed      Past Medical History:  Diagnosis Date  . 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 Chronic back pain   . Diabetes mellitus    takes Metformin daily;takes Humalog takes  8in am .12u at lunch and 15 at dinner.Lantus 35units at bedtime;;Average fasting sugars 100-120  . Eczema   . GERD (gastroesophageal reflux disease)    Prevacid prn  . Hemorrhoids   . Herpes   . Osteopenia   . Peripheral neuropathy    takes Gabapentin    Past Surgical History:  Procedure Laterality Date  . ABDOMINAL HYSTERECTOMY  10+yrs ago  . BREAST LUMPECTOMY WITH RADIOACTIVE SEED AND SENTINEL LYMPH NODE BIOPSY Left 04/12/2017   Procedure: LEFT BREAST LUMPECTOMY WITH RADIOACTIVE SEED AND SENTINEL LYMPH NODE BIOPSY WITH BLUE DYE INJECTION;  Surgeon: Donnie Mesa, MD;  Location: [facility];  Service: General;  Laterality: Left;  . COLONOSCOPY  2010  . CYSTOSCOPY     as a teenager  . DILATION AND CURETTAGE OF UTERUS  30+yrs ago   x 2  . SHOULDER ARTHROSCOPY WITH ROTATOR CUFF REPAIR Bilateral   . TUBAL LIGATION      There were no vitals filed for this visit.       Subjective Assessment - 04/25/17 1605    Subjective I had surgery on Sept 4, 2018 (lumpectomy). I have had swelling since then. I have been icing it down. I had 5 lymph nodes removed. I have a hard time making my arm  comfortable when I put my arm down against my side.    Pertinent History pt has had previous surgery on bilateral rotator cuffs in 2012 and 2013, pt diagnosed with left breast cancer stage I in August 2018, 1.3 cm, underwent lumpectomy on Sept 4, 2018 with SLNB (5 removed all negative), pt will have to complete radiation around 05/16/17   Patient Stated Goals to get the swelling down and learn the massage technique   Currently in Pain? Yes   Pain Score 1    Pain Location Axilla   Pain Orientation Left   Pain Descriptors / Indicators Sore   Pain Type Surgical pain   Pain Onset 1 to 4 weeks ago   Pain Frequency Constant   Aggravating Factors  nothing   Pain Relieving Factors ice    Effect of Pain on Daily Activities none            [facility] PT Assessment - 04/25/17 0001      Assessment   Medical Diagnosis left breast cancer   Referring Provider Gudena   Onset Date/Surgical Date 04/12/17   Hand Dominance Right   Prior Therapy none     Precautions   Precautions Other (comment)  at risk for lymphedema     Restrictions   Weight Bearing Restrictions No  Balance Screen   Has the patient fallen in the past 6 months No   Has the patient had a decrease in activity level because of a fear of falling?  No   Is the patient reluctant to leave their home because of a fear of falling?  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   Leisure walk everyday from 20-45 min, at work walks all day long, was lifting weights prior to surgery 3lbs     Cognition    Overall Cognitive Status Within Functional Limits for tasks assessed     Observation/Other Assessments   Observations left breast swollen compared to right, increased swelling in left axilla   Other Surveys  --  LLIS: 10% impairment     ROM / Strength   AROM / PROM / Strength AROM;Strength     AROM   AROM Assessment Site Shoulder   Right/Left Shoulder Right;Left   Right Shoulder Flexion 168 Degrees   Right Shoulder ABduction 163 Degrees   Right Shoulder Internal Rotation 80 Degrees   Right Shoulder External Rotation 90 Degrees   Left Shoulder Flexion 167 Degrees   Left Shoulder ABduction 176 Degrees   Left Shoulder Internal Rotation 66 Degrees   Left Shoulder External Rotation 90 Degrees     Strength   Overall Strength Within functional limits for tasks performed  UEs grossly 4+/5, triceps 3+/5 bilaterally           LYMPHEDEMA/ONCOLOGY QUESTIONNAIRE - 04/25/17 1626      Type   Cancer Type left breast cancer     Surgeries   Lumpectomy Date 04/12/17   Sentinel Lymph Node Biopsy Date 04/12/17   Number Lymph Nodes Removed 5     Date Lymphedema/Swelling Started   Date 04/12/17     Treatment   Active Chemotherapy Treatment No   Past Chemotherapy Treatment No   Active Radiation Treatment No  pt will start soon   Past Radiation Treatment No   Current Hormone Treatment No  will begin after completing radiation   Past Hormone Therapy No     What other symptoms do you have   Are you Having Heaviness or Tightness No   Are you having Pain No  pt states it is not painful but sore   Are you having pitting edema No   Is it Hard or Difficult finding clothes that fit No   Do you have infections No   Is there Decreased scar mobility Yes     Lymphedema Assessments   Lymphedema Assessments Upper extremities     Right Upper Extremity Lymphedema   15 cm Proximal to Olecranon Process 28.5 cm   Olecranon Process 22.4 cm   15 cm Proximal to Ulnar Styloid Process 21 cm    Just Proximal to Ulnar Styloid Process 15 cm   Across Hand at [employer] 19.5 cm   At [place] of 2nd Digit 6.1 cm     Left Upper Extremity Lymphedema   15 cm Proximal  to Olecranon Process 26.8 cm   Olecranon Process 21.7 cm   15 cm Proximal to Ulnar Styloid Process 20.7 cm   Just Proximal to Ulnar Styloid Process 15 cm   Across Hand at [employer] 19 cm   At [place] of 2nd Digit 6 cm           Quick Dash - 04/25/17 0001    Open a tight or new jar No difficulty   Do heavy household chores (wash walls, wash floors) No difficulty   Carry a shopping bag or briefcase Mild difficulty   Wash your back Mild difficulty   Use a knife to cut food No difficulty   Recreational activities in which you take some force or impact through your arm, shoulder, or hand (golf, hammering, tennis) Mild difficulty   During the past week, to what extent has your arm, shoulder or hand problem interfered with your normal social activities with family, friends, neighbors, or groups? Slightly   During the past week, to what extent has your arm, shoulder or hand problem limited your work or other regular daily activities Not at all   Arm, shoulder, or hand pain. Moderate   Tingling (pins and needles) in your arm, shoulder, or hand None   Difficulty Sleeping Moderate difficulty   DASH Score 18.18 %      Objective measurements completed on examination: See above findings.                  PT Education - 04/25/17 1643    Education provided Yes   Education Details lymphedema risk reduction practices, anatomy and physiology of lymphatic system   Person(s) Educated Patient   Methods Explanation;Handout   Comprehension Verbalized understanding                Long Term Clinic Goals - 04/25/17 1654      CC Long Term Goal  #1   Title Pt will report a 70% improvement in left axillary and breast swelling to allow improved comfort   Time 4   Period Weeks   Status New   Target Date  05/23/17     CC Long Term Goal  #2   Title Pt will be independent in self manual lymphatic drainage for long term management of edema   Time 4   Period Weeks   Status New   Target Date 05/23/17     CC Long Term Goal  #3   Title Pt will receive appropriate compression garments if deemed necessary for long term management of swelling   Time 4   Period Weeks   Status New   Target Date 05/23/17     CC Long Term Goal  #4   Title Pt will report improved comfort as evidenced by no soreness when she rests left arm at side   Time 4   Period Weeks   Status New   Target Date 05/23/17             Plan - 04/25/17 1644    Clinical Impression Statement Pt presents to PT with left axilla and breast swelling following left lumpectomy and SLNB. Pt has some tightness at end range of left shoulder ROM. Pt has difficulty resting her arm at her side due to the increased swelling in her left axilla. Pt also has some swelling present in her left breast. It is possible since her surgery was so recent that this is localized edema instead of lymphedema. Pt may benefit  from a compression bra if she continues to demonstrate swelling that is more chronic. Pt would benefit from skilled PT services to decrease left breast and axilla swelling.    History and Personal Factors relevant to plan of care: diabetes   Clinical Presentation Evolving   Clinical Presentation due to: pt will begin receiving radiation   Clinical Decision Making Moderate   Rehab Potential Good   Clinical Impairments Affecting Rehab Potential pt will begin radiation   PT Frequency 2x / week   PT Duration 4 weeks   PT Treatment/Interventions ADLs/Self Care Home Management;Orthotic Fit/Training;Patient/family education;Manual lymph drainage;Scar mobilization;Passive range of motion;Taping;Manual techniques   PT Next Visit Plan begin MLD to left breast and axilla, if swelling remains chronic assist pt with compression bra, if swelling still  present when  pt to begin radiation move forward with compression bra, have pt ask Dr. at appt on Friday if axillary swelling is seroma   Consulted and Agree with Plan of Care Patient      Patient will benefit from skilled therapeutic intervention in order to improve the following deficits and impairments:  Increased edema, Decreased knowledge of precautions, Decreased scar mobility  Visit Diagnosis: Localized edema - Plan: PT plan of care cert/re-cert  Aftercare following surgery for neoplasm - Plan: PT plan of care cert/re-cert     Problem List Patient Active Problem List   Diagnosis Date Noted  . Invasive lobular carcinoma of breast, stage 1, left ([place]) 03/18/2017  . Palpitations 05/12/2015  . Rotator cuff (capsule) sprain 07/23/2011    Allyson Sabal [facility] 04/25/2017, 4:57 PM  [place], [place], [address] Phone: [phone]   Fax:  [phone]  Name: Mildreth Reek MRN: [id number] Date of Birth: 02-Jul-1956  Manus Gunning, PT 04/25/17 4:57 PM

## 2017-04-26 ENCOUNTER — Ambulatory Visit: Payer: No Typology Code available for payment source

## 2017-04-26 ENCOUNTER — Telehealth: Payer: Self-pay | Admitting: Radiation Oncology

## 2017-04-26 NOTE — Telephone Encounter (Signed)
LM asking the patient if she would be interested in genetic testing. I asked her to call back so we could discuss.

## 2017-04-27 ENCOUNTER — Ambulatory Visit: Payer: No Typology Code available for payment source | Admitting: Physical Therapy

## 2017-04-27 DIAGNOSIS — R600 Localized edema: Secondary | ICD-10-CM | POA: Diagnosis not present

## 2017-04-27 DIAGNOSIS — Z483 Aftercare following surgery for neoplasm: Secondary | ICD-10-CM

## 2017-04-27 NOTE — Therapy (Signed)
[place], [place], [address] Phone: [phone]   Fax:  [phone]  Physical Therapy Treatment  Patient Details  Name: Breanna Pratt MRN: [id number] Date of Birth: 11-13-1955 Referring Provider: Lindi Adie  Encounter Date: 04/27/2017      PT End of Session - 04/27/17 1652    Visit Number 2   Number of Visits 9   Date for PT Re-Evaluation 05/23/17   PT Start Time 1600   PT Stop Time 1647   PT Time Calculation (min) 47 min   Activity Tolerance Patient tolerated treatment well   Behavior During Therapy [facility] for tasks assessed/performed      Past Medical History:  Diagnosis Date  . 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 Chronic back pain   . Diabetes mellitus    takes Metformin daily;takes Humalog takes  8in am .12u at lunch and 15 at dinner.Lantus 35units at bedtime;;Average fasting sugars 100-120  . Eczema   . GERD (gastroesophageal reflux disease)    Prevacid prn  . Hemorrhoids   . Herpes   . Osteopenia   . Peripheral neuropathy    takes Gabapentin    Past Surgical History:  Procedure Laterality Date  . ABDOMINAL HYSTERECTOMY  10+yrs ago  . BREAST LUMPECTOMY WITH RADIOACTIVE SEED AND SENTINEL LYMPH NODE BIOPSY Left 04/12/2017   Procedure: LEFT BREAST LUMPECTOMY WITH RADIOACTIVE SEED AND SENTINEL LYMPH NODE BIOPSY WITH BLUE DYE INJECTION;  Surgeon: Donnie Mesa, MD;  Location: [place];  Service: General;  Laterality: Left;  . COLONOSCOPY  2010  . CYSTOSCOPY     as a teenager  . DILATION AND CURETTAGE OF UTERUS  30+yrs ago   x 2  . SHOULDER ARTHROSCOPY WITH ROTATOR CUFF REPAIR Bilateral   . TUBAL LIGATION      There were no vitals filed for this visit.      Subjective Assessment - 04/27/17 1606    Subjective "I feel fine today.  Yesterday I didn't and that's why I didn't come in." Pt. was disoriented yesterday--it may have been low blood sugar--but she is going to see her  doctor tomorrow.   Currently in Pain? No/denies            [facility] PT Assessment - 04/27/17 0001      Observation/Other Assessments   Skin Integrity area at lower left nipple looks like a scab has just come off; not bleeding, but skin is not intact                     [facility] Adult PT Treatment/Exercise - 04/27/17 0001      Manual Therapy   Manual Therapy Edema management;Manual Lymphatic Drainage (MLD)   Edema Management Educated patient about basics of manual lymph drainage and diaphragmatic breathing, then performed it as follows.   Manual Lymphatic Drainage (MLD) In supine:  short neck, superficial and deep abdomen; right axilla and anterior interaxillary anastomosis; left groin and axillo-inguinal anastomosis; left breast, directing toward pathways; left lower axilla and axillo-inguinal anastomosis.  Then in right sidelying:  posterior interaxillary anastomosis and lateral left breast; left axillo-inguinal anastomosis.                        Long Term Clinic Goals - 04/25/17 1654      CC Long Term Goal  #1   Title Pt will report a 70% improvement in left axillary and breast swelling to allow  improved comfort   Time 4   Period Weeks   Status New   Target Date 05/23/17     CC Long Term Goal  #2   Title Pt will be independent in self manual lymphatic drainage for long term management of edema   Time 4   Period Weeks   Status New   Target Date 05/23/17     CC Long Term Goal  #3   Title Pt will receive appropriate compression garments if deemed necessary for long term management of swelling   Time 4   Period Weeks   Status New   Target Date 05/23/17     CC Long Term Goal  #4   Title Pt will report improved comfort as evidenced by no soreness when she rests left arm at side   Time 4   Period Weeks   Status New   Target Date 05/23/17            Plan - 04/27/17 1652    Clinical Impression Statement Pt. was educated about manual lymph  drainage and then this was performed for her.  She reported feeling less tenderness at end of session.   Rehab Potential Good   Clinical Impairments Affecting Rehab Potential pt will begin radiation   PT Frequency 2x / week   PT Duration 4 weeks   PT Treatment/Interventions ADLs/Self Care Home Management;Orthotic Fit/Training;Patient/family education;Manual lymph drainage;Scar mobilization;Passive range of motion;Taping;Manual techniques   PT Next Visit Plan Continue MLD to left breast and axilla, if swelling remains chronic assist pt with compression bra, if swelling still present when  pt to begin radiation move forward with compression bra, have pt ask Dr. at appt on Friday if axillary swelling is seroma   Consulted and Agree with Plan of Care Patient      Patient will benefit from skilled therapeutic intervention in order to improve the following deficits and impairments:  Increased edema, Decreased knowledge of precautions, Decreased scar mobility  Visit Diagnosis: Localized edema  Aftercare following surgery for neoplasm     Problem List Patient Active Problem List   Diagnosis Date Noted  . Invasive lobular carcinoma of breast, stage 1, left ([place]) 03/18/2017  . Palpitations 05/12/2015  . Rotator cuff (capsule) sprain 07/23/2011    Shaunessy Dobratz 04/27/2017, 4:54 PM  [place], [place], [address] Phone: [phone]   Fax:  [phone]  Name: Dominigue Gellner MRN: [id number] Date of Birth: 1956/04/07  Serafina Royals, PT 04/27/17 4:54 PM

## 2017-04-29 ENCOUNTER — Encounter: Payer: Self-pay | Admitting: Hematology and Oncology

## 2017-04-29 NOTE — Progress Notes (Signed)
Oncotype requested medical records 04/27/17, faxed records 04/28/17, confirmation received

## 2017-05-01 NOTE — Addendum Note (Signed)
Encounter addended by: Kyung Rudd, MD on: 05/01/2017 10:18 AM<BR>    Actions taken: Edit attestation on clinical note

## 2017-05-02 ENCOUNTER — Telehealth: Payer: Self-pay | Admitting: *Deleted

## 2017-05-02 ENCOUNTER — Encounter (HOSPITAL_COMMUNITY): Payer: Self-pay

## 2017-05-02 NOTE — Telephone Encounter (Signed)
Received oncotype results of 19.  Spoke with patient and let her know and that she would be getting a call to schedule her XRT.

## 2017-05-03 ENCOUNTER — Telehealth: Payer: Self-pay | Admitting: Radiation Oncology

## 2017-05-03 NOTE — Telephone Encounter (Signed)
I called the patient to check on when she would like to come in and be seen for simulation, she would like to come on Thursday, 05/12/2017 at 8 AM. I will notify simulation.

## 2017-05-09 DIAGNOSIS — C50919 Malignant neoplasm of unspecified site of unspecified female breast: Secondary | ICD-10-CM

## 2017-05-09 HISTORY — DX: Malignant neoplasm of unspecified site of unspecified female breast: C50.919

## 2017-05-10 ENCOUNTER — Ambulatory Visit: Payer: No Typology Code available for payment source | Attending: Hematology and Oncology

## 2017-05-10 DIAGNOSIS — R600 Localized edema: Secondary | ICD-10-CM | POA: Insufficient documentation

## 2017-05-10 DIAGNOSIS — Z483 Aftercare following surgery for neoplasm: Secondary | ICD-10-CM | POA: Insufficient documentation

## 2017-05-12 ENCOUNTER — Ambulatory Visit: Payer: No Typology Code available for payment source

## 2017-05-12 ENCOUNTER — Ambulatory Visit
Admission: RE | Admit: 2017-05-12 | Discharge: 2017-05-12 | Disposition: A | Discharge status: Home / Self Care | Payer: No Typology Code available for payment source | Source: Ambulatory Visit | Attending: Radiation Oncology | Admitting: Radiation Oncology

## 2017-05-12 DIAGNOSIS — Z170 Estrogen receptor positive status [ER+]: Principal | ICD-10-CM

## 2017-05-12 DIAGNOSIS — R600 Localized edema: Secondary | ICD-10-CM | POA: Diagnosis not present

## 2017-05-12 DIAGNOSIS — C50412 Malignant neoplasm of upper-outer quadrant of left female breast: Secondary | ICD-10-CM

## 2017-05-12 DIAGNOSIS — Z510 Encounter for antineoplastic radiation therapy: Secondary | ICD-10-CM | POA: Diagnosis not present

## 2017-05-12 DIAGNOSIS — Z483 Aftercare following surgery for neoplasm: Secondary | ICD-10-CM | POA: Diagnosis present

## 2017-05-12 NOTE — Therapy (Signed)
[place], [place], [address] Phone: [phone]   Fax:  [phone]  Physical Therapy Treatment  Patient Details  Name: Breanna Pratt MRN: [id number] Date of Birth: 06/24/1956 Referring Provider: Lindi Adie  Encounter Date: 05/12/2017      PT End of Session - 05/12/17 1021    Visit Number 3   Number of Visits 9   Date for PT Re-Evaluation 05/23/17   PT Start Time 0935   PT Stop Time 1019   PT Time Calculation (min) 44 min   Activity Tolerance Patient tolerated treatment well   Behavior During Therapy [facility] for tasks assessed/performed      Past Medical History:  Diagnosis Date  . 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 Chronic back pain   . Diabetes mellitus    takes Metformin daily;takes Humalog takes  8in am .12u at lunch and 15 at dinner.Lantus 35units at bedtime;;Average fasting sugars 100-120  . Eczema   . GERD (gastroesophageal reflux disease)    Prevacid prn  . Hemorrhoids   . Herpes   . Osteopenia   . Peripheral neuropathy    takes Gabapentin    Past Surgical History:  Procedure Laterality Date  . ABDOMINAL HYSTERECTOMY  10+yrs ago  . BREAST LUMPECTOMY WITH RADIOACTIVE SEED AND SENTINEL LYMPH NODE BIOPSY Left 04/12/2017   Procedure: LEFT BREAST LUMPECTOMY WITH RADIOACTIVE SEED AND SENTINEL LYMPH NODE BIOPSY WITH BLUE DYE INJECTION;  Surgeon: Donnie Mesa, MD;  Location: [place];  Service: General;  Laterality: Left;  . COLONOSCOPY  2010  . CYSTOSCOPY     as a teenager  . DILATION AND CURETTAGE OF UTERUS  30+yrs ago   x 2  . SHOULDER ARTHROSCOPY WITH ROTATOR CUFF REPAIR Bilateral   . TUBAL LIGATION      There were no vitals filed for this visit.      Subjective Assessment - 05/12/17 0939    Subjective I had fluid drawn out of my axilla, I don't know if it was a seroms but he took out a vial of fluid. I've been trying to do my massage and Ithink it's going okay. I start  radiation next week.   Pertinent History pt has had previous surgery on bilateral rotator cuffs in 2012 and 2013, pt diagnosed with left breast cancer stage I in August 2018, 1.3 cm, underwent lumpectomy on Sept 4, 2018 with SLNB (5 removed all negative), pt will have to complete radiation around 05/16/17   Patient Stated Goals to get the swelling down and learn the massage technique   Currently in Pain? No/denies                         [facility] Adult PT Treatment/Exercise - 05/12/17 0001      Manual Therapy   Manual therapy comments Issued 1/4" gray foam for wear in bra at lateral trunk/breast   Manual Lymphatic Drainage (MLD) In supine:  short neck, superficial and deep abdomen; right axilla and anterior interaxillary anastomosis; left groin and axillo-inguinal anastomosis; left breast, directing toward pathways; left lower axilla and axillo-inguinal anastomosis.  Then in right sidelying:  posterior interaxillary anastomosis and lateral left breast; left axillo-inguinal anastomosis.reviewing with pt throughout                        [place] - 05/12/17 1016      CC Long Term Goal  #  1   Title Pt will report a 70% improvement in left axillary and breast swelling to allow improved comfort   Baseline near 100%-05/12/17   Status Achieved     CC Long Term Goal  #2   Title Pt will be independent in self manual lymphatic drainage for long term management of edema   Status Achieved     CC Long Term Goal  #3   Title Pt will receive appropriate compression garments if deemed necessary for long term management of swelling   Baseline Pt has looked but not founda sports bra that fits well, either too loose or doesn't come into axilla enough-05/12/17   Status On-going     CC Long Term Goal  #4   Title Pt will report improved comfort as evidenced by no soreness when she rests left arm at side   Baseline Only feels this minimally now-05/12/17   Status  Partially Met            Plan - 05/12/17 1022    Clinical Impression Statement Pt did very well with verbalizing understanding of technique and correct pressure for self manual lymph drainage while therapist performing today. Also further discussed compression bra with pt. She reports has had trouble finding one, they have either been too loose or don't come into axilla enough. She planned to go to [facility] [place] today for more information. Pt is progressing well towards goals an dstarts radiation next week.    Rehab Potential Good   Clinical Impairments Affecting Rehab Potential pt will begin radiation week of 05/16/17   PT Frequency 2x / week   PT Duration 4 weeks   PT Next Visit Plan Continue MLD to left breast and axilla, see if pt tolerated foam and how visit to [facility] [place] went.   Recommended Other Services Prescription faxed to Dr. Jacelyn Grip today for compression bra   Consulted and Agree with Plan of Care Patient      Patient will benefit from skilled therapeutic intervention in order to improve the following deficits and impairments:  Increased edema, Decreased knowledge of precautions, Decreased scar mobility  Visit Diagnosis: Localized edema  Aftercare following surgery for neoplasm     Problem List Patient Active Problem List   Diagnosis Date Noted  . Invasive lobular carcinoma of breast, stage 1, left ([place]) 03/18/2017  . Palpitations 05/12/2015  . Rotator cuff (capsule) sprain 07/23/2011    Otelia Limes, PTA 05/12/2017, 12:15 PM  [place], [place], [address] Phone: [phone]   Fax:  [phone]  Name: Shatora Weatherbee MRN: [id number] Date of Birth: 1955/09/01

## 2017-05-13 NOTE — Progress Notes (Signed)
  Radiation Oncology         [phone]) [phone] ________________________________  Name: Breanna Pratt MRN: [id number]  Date: 05/12/2017  DOB: [birth year]  Optical Surface Tracking Plan:  Since intensity modulated radiotherapy (IMRT) and 3D conformal radiation treatment methods are predicated on accurate and precise positioning for treatment, intrafraction motion monitoring is medically necessary to ensure accurate and safe treatment delivery.  The ability to quantify intrafraction motion without excessive ionizing radiation dose can only be performed with optical surface tracking. Accordingly, surface imaging offers the opportunity to obtain 3D measurements of patient position throughout IMRT and 3D treatments without excessive radiation exposure.  I am ordering optical surface tracking for this patient's upcoming course of radiotherapy. ________________________________  Kyung Rudd, MD 05/13/2017 5:51 PM    Reference:   Ursula Alert, J, et al. Surface imaging-based analysis of intrafraction motion for breast radiotherapy patients.Journal of [place], n. 6, nov. 2014. ISSN [id number].   Available at: <http://www.jacmp.org/index.php/jacmp/article/view/4957>.

## 2017-05-13 NOTE — Progress Notes (Signed)
  Radiation Oncology         [phone]) [phone] ________________________________  Name: Breanna Pratt MRN: [id number]  Date: 05/12/2017  DOB: 11-02-1955   DIAGNOSIS:     ICD-10-CM   1. Carcinoma of upper-outer quadrant of left breast in female, estrogen receptor positive ([place]) C50.412    Z17.0     SIMULATION AND TREATMENT PLANNING NOTE  The patient presented for simulation prior to beginning her course of radiation treatment for her diagnosis of left-sided breast cancer. The patient was placed in a supine position on a breast board. A customized vac-lock bag was constructed and this complex treatment device will be used on a daily basis during her treatment. In this fashion, a CT scan was obtained through the chest area and an isocenter was placed near the chest wall within the breast.  The patient will be planned to receive a course of radiation initially to a dose of 42.56 Gy. This will consist of a whole breast radiotherapy technique. To accomplish this, 2 customized blocks have been designed which will correspond to medial and lateral whole breast tangent fields. This treatment will be accomplished at 2.66 Gy per fraction. A forward planning technique will also be evaluated to determine if this approach improves the plan. It is anticipated that the patient will then receive a 10 Gy boost to the seroma cavity which has been contoured. This will be accomplished at 2.5 Gy per fraction.   This initial treatment will consist of a 3-D conformal technique. The seroma has been contoured as the primary target structure. Additionally, dose volume histograms of both this target as well as the lungs and heart will also be evaluated. Such an approach is necessary to ensure that the target area is adequately covered while the nearby critical  normal structures are adequately spared.  Plan:  The final anticipated total dose therefore will correspond to 52.56 Gy.  Special treatment procedure was performed today  due to the extra time and effort required by myself to plan and prepare this patient for deep inspiration breath hold technique.  I have determined cardiac sparing to be of benefit to this patient to prevent long term cardiac damage due to radiation of the heart.  Bellows were placed on the patient's abdomen. To facilitate cardiac sparing, the patient was coached by the radiation therapists on breath hold techniques and breathing practice was performed. Practice waveforms were obtained. The patient was then scanned while maintaining breath hold in the treatment position.  This image was then transferred over to the imaging specialist. The imaging specialist then created a fusion of the free breathing and breath hold scans using the chest wall as the stable structure. I personally reviewed the fusion in axial, coronal and sagittal image planes.  Excellent cardiac sparing was obtained.  I felt the patient is an appropriate candidate for breath hold and the patient will be treated as such.  The image fusion was then reviewed with the patient to reinforce the necessity of reproducible breath hold.      _______________________________   Jodelle Gross, MD, PhD

## 2017-05-16 ENCOUNTER — Ambulatory Visit: Payer: No Typology Code available for payment source | Admitting: Physical Therapy

## 2017-05-16 ENCOUNTER — Encounter: Payer: Self-pay | Admitting: Physical Therapy

## 2017-05-16 DIAGNOSIS — R600 Localized edema: Secondary | ICD-10-CM | POA: Diagnosis not present

## 2017-05-16 DIAGNOSIS — Z483 Aftercare following surgery for neoplasm: Secondary | ICD-10-CM

## 2017-05-16 NOTE — Therapy (Signed)
[place], [place], [address] Phone: [phone]   Fax:  [phone]  Physical Therapy Treatment  Patient Details  Name: Breanna Pratt MRN: [id number] Date of Birth: [birth year] Referring Provider: Lindi Adie  Encounter Date: 05/16/2017      PT End of Session - 05/16/17 0839    Visit Number 4   Number of Visits 9   Date for PT Re-Evaluation 05/23/17   PT Start Time 0803   PT Stop Time 0838  pt had to leave early    PT Time Calculation (min) 35 min   Activity Tolerance Patient tolerated treatment well   Behavior During Therapy [facility] for tasks assessed/performed      Past Medical History:  Diagnosis Date  . 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 Chronic back pain   . Diabetes mellitus    takes Metformin daily;takes Humalog takes  8in am .12u at lunch and 15 at dinner.Lantus 35units at bedtime;;Average fasting sugars 100-120  . Eczema   . GERD (gastroesophageal reflux disease)    Prevacid prn  . Hemorrhoids   . Herpes   . Osteopenia   . Peripheral neuropathy    takes Gabapentin    Past Surgical History:  Procedure Laterality Date  . ABDOMINAL HYSTERECTOMY  10+yrs ago  . BREAST LUMPECTOMY WITH RADIOACTIVE SEED AND SENTINEL LYMPH NODE BIOPSY Left 04/12/2017   Procedure: LEFT BREAST LUMPECTOMY WITH RADIOACTIVE SEED AND SENTINEL LYMPH NODE BIOPSY WITH BLUE DYE INJECTION;  Surgeon: Donnie Mesa, MD;  Location: [place];  Service: General;  Laterality: Left;  . COLONOSCOPY  2010  . CYSTOSCOPY     as a teenager  . DILATION AND CURETTAGE OF UTERUS  30+yrs ago   x 2  . SHOULDER ARTHROSCOPY WITH ROTATOR CUFF REPAIR Bilateral   . TUBAL LIGATION      There were no vitals filed for this visit.      Subjective Assessment - 05/16/17 0803    Subjective I am doing the massage my self. I don't have any questions. I think my swelling is better. The foam works good. I haven't been to [facility] [place]  yet. I will go tomorrow evening when I get off.                          [facility] Adult PT Treatment/Exercise - 05/16/17 0001      Manual Therapy   Manual Lymphatic Drainage (MLD) In supine:  short neck, 5 diaphragmatic breaths, right axilla and anterior interaxillary anastomosis; left groin and axillo-inguinal anastomosis; left breast, directing toward pathways; left lower axilla and axillo-inguinal anastomosis.  Then in right sidelying:  posterior interaxillary anastomosis and lateral left breast; left axillo-inguinal anastomosis                        Long Term Clinic Goals - 05/12/17 1016      CC Long Term Goal  #1   Title Pt will report a 70% improvement in left axillary and breast swelling to allow improved comfort   Baseline near 100%-05/12/17   Status Achieved     CC Long Term Goal  #2   Title Pt will be independent in self manual lymphatic drainage for long term management of edema   Status Achieved     CC Long Term Goal  #3   Title Pt will receive appropriate compression garments if deemed necessary for  long term management of swelling   Baseline Pt has looked but not founda sports bra that fits well, either too loose or doesn't come into axilla enough-05/12/17   Status On-going     CC Long Term Goal  #4   Title Pt will report improved comfort as evidenced by no soreness when she rests left arm at side   Baseline Only feels this minimally now-05/12/17   Status Partially Met            Plan - 05/16/17 0840    Clinical Impression Statement Awaiting signed Rx for compression bra from doctor. Once pt obtains this she will go to [facility] [place]. Pt states she has been doing self manual lymphatic drainage daily for 20 min and does not have questions about this. She feels her breast swelling is going down. Pt starts radiation this week. Pt feels the foam is working well.    Rehab Potential Good   Clinical Impairments Affecting Rehab Potential pt  will begin radiation week of 05/16/17   PT Frequency 2x / week   PT Duration 4 weeks   PT Treatment/Interventions ADLs/Self Care Home Management;Orthotic Fit/Training;Patient/family education;Manual lymph drainage;Scar mobilization;Passive range of motion;Taping;Manual techniques   PT Next Visit Plan Continue MLD to left breast and axilla,  and how visit to [facility] [place] went- did rx come in?   Consulted and Agree with Plan of Care Patient      Patient will benefit from skilled therapeutic intervention in order to improve the following deficits and impairments:  Increased edema, Decreased knowledge of precautions, Decreased scar mobility  Visit Diagnosis: Localized edema  Aftercare following surgery for neoplasm     Problem List Patient Active Problem List   Diagnosis Date Noted  . Carcinoma of upper-outer quadrant of left breast in female, estrogen receptor positive ([place]) 03/18/2017  . Palpitations 05/12/2015  . Rotator cuff (capsule) sprain 07/23/2011    Allyson Sabal [facility] 05/16/2017, 8:42 AM  [place], [place], [address] Phone: [phone]   Fax:  [phone]  Name: Breanna Pratt MRN: [id number] Date of Birth: 1956/04/15  Manus Gunning, PT 05/16/17 8:42 AM

## 2017-05-17 DIAGNOSIS — Z510 Encounter for antineoplastic radiation therapy: Secondary | ICD-10-CM | POA: Diagnosis not present

## 2017-05-18 ENCOUNTER — Telehealth: Payer: Self-pay | Admitting: Hematology and Oncology

## 2017-05-18 NOTE — Telephone Encounter (Signed)
Scheduled appt per 10/5 sch message - patient is aware of appt date and time.  

## 2017-05-19 ENCOUNTER — Ambulatory Visit: Payer: No Typology Code available for payment source

## 2017-05-19 ENCOUNTER — Ambulatory Visit
Admission: RE | Admit: 2017-05-19 | Discharge: 2017-05-19 | Disposition: A | Discharge status: Home / Self Care | Payer: No Typology Code available for payment source | Source: Ambulatory Visit | Attending: Radiation Oncology | Admitting: Radiation Oncology

## 2017-05-19 DIAGNOSIS — Z483 Aftercare following surgery for neoplasm: Secondary | ICD-10-CM

## 2017-05-19 DIAGNOSIS — R600 Localized edema: Secondary | ICD-10-CM | POA: Diagnosis not present

## 2017-05-19 DIAGNOSIS — Z510 Encounter for antineoplastic radiation therapy: Secondary | ICD-10-CM | POA: Diagnosis not present

## 2017-05-19 NOTE — Therapy (Addendum)
[place], [place], [address] Phone: [phone]   Fax:  [phone]  Physical Therapy Treatment  Patient Details  Name: Breanna Pratt MRN: [id number] Date of Birth: [birth year] Referring Provider: Lindi Adie  Encounter Date: 05/19/2017      PT End of Session - 05/19/17 0847    Visit Number 5   Number of Visits 9   Date for PT Re-Evaluation 05/23/17   PT Start Time 0803   PT Stop Time 0847   PT Time Calculation (min) 44 min   Activity Tolerance Patient tolerated treatment well   Behavior During Therapy [facility] for tasks assessed/performed      Past Medical History:  Diagnosis Date  . 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 Chronic back pain   . Diabetes mellitus    takes Metformin daily;takes Humalog takes  8in am .12u at lunch and 15 at dinner.Lantus 35units at bedtime;;Average fasting sugars 100-120  . Eczema   . GERD (gastroesophageal reflux disease)    Prevacid prn  . Hemorrhoids   . Herpes   . Osteopenia   . Peripheral neuropathy    takes Gabapentin    Past Surgical History:  Procedure Laterality Date  . ABDOMINAL HYSTERECTOMY  10+yrs ago  . BREAST LUMPECTOMY WITH RADIOACTIVE SEED AND SENTINEL LYMPH NODE BIOPSY Left 04/12/2017   Procedure: LEFT BREAST LUMPECTOMY WITH RADIOACTIVE SEED AND SENTINEL LYMPH NODE BIOPSY WITH BLUE DYE INJECTION;  Surgeon: Donnie Mesa, MD;  Location: [facility];  Service: General;  Laterality: Left;  . COLONOSCOPY  2010  . CYSTOSCOPY     as a teenager  . DILATION AND CURETTAGE OF UTERUS  30+yrs ago   x 2  . SHOULDER ARTHROSCOPY WITH ROTATOR CUFF REPAIR Bilateral   . TUBAL LIGATION      There were no vitals filed for this visit.      Subjective Assessment - 05/19/17 0806    Subjective I'm doing well and the self Gerrianne Scale has been going well. My Lt breast swelling is getting smaller.    Pertinent History pt has had previous surgery on bilateral rotator  cuffs in 2012 and 2013, pt diagnosed with left breast cancer stage I in August 2018, 1.3 cm, underwent lumpectomy on Sept 4, 2018 with SLNB (5 removed all negative), pt will have to complete radiation around 05/16/17   Patient Stated Goals to get the swelling down and learn the massage technique   Currently in Pain? No/denies                         [facility] Adult PT Treatment/Exercise - 05/19/17 0001      Manual Therapy   Manual Lymphatic Drainage (MLD) In supine:  short neck, 5 diaphragmatic breaths, right axilla and anterior interaxillary anastomosis; left groin and axillo-inguinal anastomosis; left breast, directing toward pathways; left lower axilla and axillo-inguinal anastomosis.  Then in right sidelying:  posterior interaxillary anastomosis and lateral left breast; left axillo-inguinal anastomosis                        Long Term Clinic Goals - 05/19/17 0903      CC Long Term Goal  #1   Title Pt will report a 70% improvement in left axillary and breast swelling to allow improved comfort   Baseline near 100%-05/12/17   Status Achieved     CC Long Term Goal  #2  Title Pt will be independent in self manual lymphatic drainage for long term management of edema   Baseline Performs this daily <20 mins/day-05/19/17   Status Achieved     CC Long Term Goal  #3   Title Pt will receive appropriate compression garments if deemed necessary for long term management of swelling   Baseline Pt has looked but not founda sports bra that fits well, either too loose or doesn't come into axilla enough-05/12/17; issued script for compression bra which pt will go to [facility] this week for-05/19/17   Status Achieved     CC Long Term Goal  #4   Title Pt will report improved comfort as evidenced by no soreness when she rests left arm at side   Baseline Only feels this minimally now-05/12/17; pt reports this mostly improved, just some tenderness at incision still felt  with arm at side-05/19/17   Status Partially Met            Plan - 05/19/17 0847    Clinical Impression Statement Issued signed script for compression bra which pt plans to try to get on Friday. She is doing very well overall with her progress andher Lt breast is much softer and pt reports feeling competent in controlling symptoms. She has met all goals as well so spoke with pt today about possible D/C or being on hold as she starts radiation today. She has her first treatment today and reports will call us after as she wants to see how it goes but potentially be on hold.   Rehab Potential Good   Clinical Impairments Affecting Rehab Potential pt will begin radiation week of 05/16/17   PT Frequency 2x / week   PT Duration 4 weeks   PT Treatment/Interventions ADLs/Self Care Home Management;Orthotic Fit/Training;Patient/family education;Manual lymph drainage;Scar mobilization;Passive range of motion;Taping;Manual techniques   PT Next Visit Plan Consider D/C or on hold if pt returns (she wanted to wait and see how she felt after first radiation treatment today).    Recommended Other Services Prescription received from prescribing Dr. Lindi Adie and issued to pt-05/19/17   Consulted and Agree with Plan of Care Patient      Patient will benefit from skilled therapeutic intervention in order to improve the following deficits and impairments:  Increased edema, Decreased knowledge of precautions, Decreased scar mobility  Visit Diagnosis: Localized edema  Aftercare following surgery for neoplasm     Problem List Patient Active Problem List   Diagnosis Date Noted  . Carcinoma of upper-outer quadrant of left breast in female, estrogen receptor positive ([place]) 03/18/2017  . Palpitations 05/12/2015  . Rotator cuff (capsule) sprain 07/23/2011    Breanna Pratt, PTA 05/19/2017, 9:05 AM  [place],  [place], [address] Phone: [phone]   Fax:  [phone]  Name: Breanna Pratt MRN: [id number] Date of Birth: 12/28/1955  PHYSICAL THERAPY DISCHARGE SUMMARY  Visits from Start of Care: 5  Current functional level related to goals / functional outcomes: See above   Remaining deficits: See above   Education / Equipment: See above  Plan: Patient agrees to discharge.  Patient goals were met. Patient is being discharged due to meeting the stated rehab goals.  ?????    Allyson Sabal [place], [place] 09/28/17 3:34 PM

## 2017-05-23 ENCOUNTER — Encounter: Payer: No Typology Code available for payment source | Admitting: Physical Therapy

## 2017-05-23 ENCOUNTER — Ambulatory Visit
Admission: RE | Admit: 2017-05-23 | Discharge: 2017-05-23 | Disposition: A | Discharge status: Home / Self Care | Payer: No Typology Code available for payment source | Source: Ambulatory Visit | Attending: Radiation Oncology | Admitting: Radiation Oncology

## 2017-05-23 DIAGNOSIS — C50412 Malignant neoplasm of upper-outer quadrant of left female breast: Secondary | ICD-10-CM

## 2017-05-23 DIAGNOSIS — Z170 Estrogen receptor positive status [ER+]: Principal | ICD-10-CM

## 2017-05-23 DIAGNOSIS — Z510 Encounter for antineoplastic radiation therapy: Secondary | ICD-10-CM | POA: Diagnosis not present

## 2017-05-23 MED ORDER — RADIAPLEXRX EX GEL
Freq: Once | CUTANEOUS | Status: AC
  Administered 2017-05-23: 12:00:00 via TOPICAL

## 2017-05-23 MED ORDER — ALRA NON-METALLIC DEODORANT (RAD-ONC)
1.0000 | Freq: Once | TOPICAL | Status: AC
Start: 2017-05-23 — End: 2017-05-23
  Administered 2017-05-23: 1 via TOPICAL

## 2017-05-23 NOTE — Progress Notes (Signed)

## 2017-05-23 NOTE — Addendum Note (Signed)
Encounter addended by: Doreen Beam, RN on: 05/23/2017 12:44 PM<BR>    Actions taken: Flowsheet accepted

## 2017-05-24 ENCOUNTER — Ambulatory Visit: Payer: No Typology Code available for payment source

## 2017-05-25 ENCOUNTER — Ambulatory Visit
Admission: RE | Admit: 2017-05-25 | Discharge: 2017-05-25 | Disposition: A | Discharge status: Home / Self Care | Payer: No Typology Code available for payment source | Source: Ambulatory Visit | Attending: Radiation Oncology | Admitting: Radiation Oncology

## 2017-05-25 DIAGNOSIS — Z510 Encounter for antineoplastic radiation therapy: Secondary | ICD-10-CM | POA: Diagnosis not present

## 2017-05-26 ENCOUNTER — Ambulatory Visit
Admission: RE | Admit: 2017-05-26 | Discharge: 2017-05-26 | Disposition: A | Discharge status: Home / Self Care | Payer: No Typology Code available for payment source | Source: Ambulatory Visit | Attending: Radiation Oncology | Admitting: Radiation Oncology

## 2017-05-26 DIAGNOSIS — Z510 Encounter for antineoplastic radiation therapy: Secondary | ICD-10-CM | POA: Diagnosis not present

## 2017-05-27 ENCOUNTER — Ambulatory Visit
Admission: RE | Admit: 2017-05-27 | Discharge: 2017-05-27 | Disposition: A | Discharge status: Home / Self Care | Payer: No Typology Code available for payment source | Source: Ambulatory Visit | Attending: Radiation Oncology | Admitting: Radiation Oncology

## 2017-05-27 DIAGNOSIS — Z510 Encounter for antineoplastic radiation therapy: Secondary | ICD-10-CM | POA: Diagnosis not present

## 2017-05-30 ENCOUNTER — Ambulatory Visit
Admission: RE | Admit: 2017-05-30 | Discharge: 2017-05-30 | Disposition: A | Discharge status: Home / Self Care | Payer: No Typology Code available for payment source | Source: Ambulatory Visit | Attending: Radiation Oncology | Admitting: Radiation Oncology

## 2017-05-30 DIAGNOSIS — Z510 Encounter for antineoplastic radiation therapy: Secondary | ICD-10-CM | POA: Diagnosis not present

## 2017-05-31 ENCOUNTER — Ambulatory Visit
Admission: RE | Admit: 2017-05-31 | Discharge: 2017-05-31 | Disposition: A | Discharge status: Home / Self Care | Payer: No Typology Code available for payment source | Source: Ambulatory Visit | Attending: Radiation Oncology | Admitting: Radiation Oncology

## 2017-05-31 DIAGNOSIS — Z510 Encounter for antineoplastic radiation therapy: Secondary | ICD-10-CM | POA: Diagnosis not present

## 2017-06-01 ENCOUNTER — Ambulatory Visit
Admission: RE | Admit: 2017-06-01 | Discharge: 2017-06-01 | Disposition: A | Discharge status: Home / Self Care | Payer: No Typology Code available for payment source | Source: Ambulatory Visit | Attending: Radiation Oncology | Admitting: Radiation Oncology

## 2017-06-01 DIAGNOSIS — Z510 Encounter for antineoplastic radiation therapy: Secondary | ICD-10-CM | POA: Diagnosis not present

## 2017-06-02 ENCOUNTER — Ambulatory Visit
Admission: RE | Admit: 2017-06-02 | Discharge: 2017-06-02 | Disposition: A | Discharge status: Home / Self Care | Payer: No Typology Code available for payment source | Source: Ambulatory Visit | Attending: Radiation Oncology | Admitting: Radiation Oncology

## 2017-06-02 DIAGNOSIS — Z510 Encounter for antineoplastic radiation therapy: Secondary | ICD-10-CM | POA: Diagnosis not present

## 2017-06-03 ENCOUNTER — Ambulatory Visit
Admission: RE | Admit: 2017-06-03 | Discharge: 2017-06-03 | Disposition: A | Discharge status: Home / Self Care | Payer: No Typology Code available for payment source | Source: Ambulatory Visit | Attending: Radiation Oncology | Admitting: Radiation Oncology

## 2017-06-03 DIAGNOSIS — Z510 Encounter for antineoplastic radiation therapy: Secondary | ICD-10-CM | POA: Diagnosis not present

## 2017-06-06 ENCOUNTER — Ambulatory Visit
Admission: RE | Admit: 2017-06-06 | Discharge: 2017-06-06 | Disposition: A | Discharge status: Home / Self Care | Payer: No Typology Code available for payment source | Source: Ambulatory Visit | Attending: Radiation Oncology | Admitting: Radiation Oncology

## 2017-06-06 DIAGNOSIS — Z510 Encounter for antineoplastic radiation therapy: Secondary | ICD-10-CM | POA: Diagnosis not present

## 2017-06-07 ENCOUNTER — Ambulatory Visit
Admission: RE | Admit: 2017-06-07 | Discharge: 2017-06-07 | Disposition: A | Discharge status: Home / Self Care | Payer: No Typology Code available for payment source | Source: Ambulatory Visit | Attending: Radiation Oncology | Admitting: Radiation Oncology

## 2017-06-07 DIAGNOSIS — Z510 Encounter for antineoplastic radiation therapy: Secondary | ICD-10-CM | POA: Diagnosis not present

## 2017-06-08 ENCOUNTER — Ambulatory Visit
Admission: RE | Admit: 2017-06-08 | Discharge: 2017-06-08 | Disposition: A | Discharge status: Home / Self Care | Payer: No Typology Code available for payment source | Source: Ambulatory Visit | Attending: Radiation Oncology | Admitting: Radiation Oncology

## 2017-06-08 DIAGNOSIS — Z510 Encounter for antineoplastic radiation therapy: Secondary | ICD-10-CM | POA: Diagnosis not present

## 2017-06-09 ENCOUNTER — Ambulatory Visit
Admission: RE | Admit: 2017-06-09 | Discharge: 2017-06-09 | Disposition: A | Discharge status: Home / Self Care | Payer: No Typology Code available for payment source | Source: Ambulatory Visit | Attending: Radiation Oncology | Admitting: Radiation Oncology

## 2017-06-09 DIAGNOSIS — Z510 Encounter for antineoplastic radiation therapy: Secondary | ICD-10-CM | POA: Diagnosis not present

## 2017-06-10 ENCOUNTER — Ambulatory Visit
Admission: RE | Admit: 2017-06-10 | Discharge: 2017-06-10 | Disposition: A | Discharge status: Home / Self Care | Payer: No Typology Code available for payment source | Source: Ambulatory Visit | Attending: Radiation Oncology | Admitting: Radiation Oncology

## 2017-06-10 ENCOUNTER — Ambulatory Visit: Payer: No Typology Code available for payment source | Admitting: Radiation Oncology

## 2017-06-10 DIAGNOSIS — Z510 Encounter for antineoplastic radiation therapy: Secondary | ICD-10-CM | POA: Diagnosis not present

## 2017-06-13 ENCOUNTER — Ambulatory Visit
Admission: RE | Admit: 2017-06-13 | Discharge: 2017-06-13 | Disposition: A | Discharge status: Home / Self Care | Payer: No Typology Code available for payment source | Source: Ambulatory Visit | Attending: Radiation Oncology | Admitting: Radiation Oncology

## 2017-06-13 DIAGNOSIS — Z510 Encounter for antineoplastic radiation therapy: Secondary | ICD-10-CM | POA: Diagnosis not present

## 2017-06-14 ENCOUNTER — Ambulatory Visit
Admission: RE | Admit: 2017-06-14 | Discharge: 2017-06-14 | Disposition: A | Discharge status: Home / Self Care | Payer: No Typology Code available for payment source | Source: Ambulatory Visit | Attending: Radiation Oncology | Admitting: Radiation Oncology

## 2017-06-14 DIAGNOSIS — Z510 Encounter for antineoplastic radiation therapy: Secondary | ICD-10-CM | POA: Diagnosis not present

## 2017-06-15 ENCOUNTER — Ambulatory Visit
Admission: RE | Admit: 2017-06-15 | Discharge: 2017-06-15 | Disposition: A | Discharge status: Home / Self Care | Payer: No Typology Code available for payment source | Source: Ambulatory Visit | Attending: Radiation Oncology | Admitting: Radiation Oncology

## 2017-06-15 ENCOUNTER — Ambulatory Visit: Payer: No Typology Code available for payment source

## 2017-06-15 DIAGNOSIS — Z510 Encounter for antineoplastic radiation therapy: Secondary | ICD-10-CM | POA: Diagnosis not present

## 2017-06-16 ENCOUNTER — Ambulatory Visit: Payer: No Typology Code available for payment source

## 2017-06-16 ENCOUNTER — Ambulatory Visit
Admission: RE | Admit: 2017-06-16 | Discharge: 2017-06-16 | Disposition: A | Discharge status: Home / Self Care | Payer: No Typology Code available for payment source | Source: Ambulatory Visit | Attending: Radiation Oncology | Admitting: Radiation Oncology

## 2017-06-16 DIAGNOSIS — Z510 Encounter for antineoplastic radiation therapy: Secondary | ICD-10-CM | POA: Diagnosis not present

## 2017-06-17 ENCOUNTER — Ambulatory Visit: Payer: No Typology Code available for payment source

## 2017-06-17 ENCOUNTER — Ambulatory Visit
Admission: RE | Admit: 2017-06-17 | Discharge: 2017-06-17 | Disposition: A | Discharge status: Home / Self Care | Payer: No Typology Code available for payment source | Source: Ambulatory Visit | Attending: Radiation Oncology | Admitting: Radiation Oncology

## 2017-06-17 DIAGNOSIS — Z510 Encounter for antineoplastic radiation therapy: Secondary | ICD-10-CM | POA: Diagnosis not present

## 2017-06-20 ENCOUNTER — Ambulatory Visit: Payer: No Typology Code available for payment source

## 2017-06-20 ENCOUNTER — Ambulatory Visit
Admission: RE | Admit: 2017-06-20 | Discharge: 2017-06-20 | Disposition: A | Discharge status: Home / Self Care | Payer: No Typology Code available for payment source | Source: Ambulatory Visit | Attending: Radiation Oncology | Admitting: Radiation Oncology

## 2017-06-20 ENCOUNTER — Encounter: Payer: Self-pay | Admitting: Radiation Oncology

## 2017-06-20 DIAGNOSIS — Z510 Encounter for antineoplastic radiation therapy: Secondary | ICD-10-CM | POA: Diagnosis not present

## 2017-06-21 ENCOUNTER — Ambulatory Visit: Payer: No Typology Code available for payment source

## 2017-06-24 ENCOUNTER — Encounter: Payer: Self-pay | Admitting: *Deleted

## 2017-06-24 NOTE — Progress Notes (Signed)
°  Radiation Oncology         [id number]) [phone] ________________________________  Name: Breanna Pratt MRN: [id number]  Date: 06/20/2017  DOB: [birth year]  End of Treatment Note  Diagnosis:   61 y.o. 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  Narrative: The patient tolerated radiation treatment relatively well.   She denied any fatigue but did have some "stinging" pain. She denied any edema. Hyperpigmentation without moist desquamation was noted in the treatment area. She is using radiaplex gel as directed.  Plan: The patient has completed radiation treatment. The patient will return to radiation oncology clinic for routine followup in one month. I advised the patient to call or return sooner if they have any questions or concerns related to their recovery or treatment. ________________________________  Jodelle Gross, M.D., Ph.D.  This document serves as a record of services personally performed by Kyung Rudd, MD. It was created on his behalf by Rae Lips, a trained medical scribe. The creation of this record is based on the scribe's personal observations and the provider's statements to them. This document has been checked and approved by the attending provider.

## 2017-06-27 ENCOUNTER — Ambulatory Visit (HOSPITAL_BASED_OUTPATIENT_CLINIC_OR_DEPARTMENT_OTHER): Payer: No Typology Code available for payment source | Admitting: Hematology and Oncology

## 2017-06-27 ENCOUNTER — Telehealth: Payer: Self-pay | Admitting: Hematology and Oncology

## 2017-06-27 DIAGNOSIS — C50412 Malignant neoplasm of upper-outer quadrant of left female breast: Secondary | ICD-10-CM

## 2017-06-27 DIAGNOSIS — Z170 Estrogen receptor positive status [ER+]: Secondary | ICD-10-CM

## 2017-06-27 MED ORDER — ANASTROZOLE 1 MG PO TABS: 1.0000 mg | ORAL_TABLET | Freq: Every day | ORAL | 3 refills | Status: DC

## 2017-06-27 NOTE — Assessment & Plan Note (Addendum)
04/12/2017 Left lumpectomy: Fibrocystic changes; superior margin: Invasive pleomorphic lobular carcinoma grade 3, 1.5 cm, perineural invasion and lymphovascular invasion present, pleomorphic LCIS, margins negative, 0/5 lymph nodes negative, ER 100%, PR 100%, HER-2 negative, Ki-67 15% T1 CN 0 stage IA  Oncotype DX score 19: 12% risk of recurrence with tamoxifen alone over 10 years  Adjuvant radiation therapy 05/23/2017- 06/20/2017  Treatment plan: Adjuvant antiestrogen therapy with anastrozole 1 mg p.o. daily times 5-7 years  Anastrozole counseling: We discussed the risks and benefits of anti-estrogen therapy with aromatase inhibitors. These include but not limited to insomnia, hot flashes, mood changes, vaginal dryness, bone density loss, and weight gain. We strongly believe that the benefits far outweigh the risks. Patient understands these risks and consented to starting treatment. Planned treatment duration is 5-7 years.  Return to clinic in 3 months with survivorship care plan visit

## 2017-06-27 NOTE — Progress Notes (Signed)
Patient Care Team: Vernie Shanks, MD as PCP - General (Family Medicine)  DIAGNOSIS:  Encounter Diagnosis  Name Primary?  . Carcinoma of upper-outer quadrant of left breast in female, estrogen receptor positive ([place])     SUMMARY OF ONCOLOGIC HISTORY:   Carcinoma of upper-outer quadrant of left breast in female, estrogen receptor positive ([place])   03/10/2017 Initial Diagnosis    Invasive lobular cancer grade 2, ER 100%, PR 100%, HER-2 negative ratio 1.51, Ki-67 15%      03/23/2017 Breast MRI    Irregular mass in the retroareolar left breast 1.7 x 1 x 1.3 cm, no abnormal lymph nodes, T1 CN 0 stage IA       04/12/2017 Surgery    Left lumpectomy: Fibrocystic changes; superior margin: Invasive pleomorphic lobular carcinoma grade 3, 1.5 cm, perineural invasion and lymphovascular invasion present, pleomorphic LCIS, margins negative, 0/5 lymph nodes negative, ER 100%, PR 100%, HER-2 negative, Ki-67 15% T1 CN 0 stage IA      04/25/2017 Oncotype testing    Oncotype DX score 19: 12% risk of recurrence with tamoxifen alone over 10 years      05/23/2017 - 06/20/2017 Radiation Therapy    Adjuvant radiation therapy       CHIEF COMPLIANT: Follow-up after adjuvant radiation therapy  INTERVAL HISTORY: Breanna Pratt is a 61 year old with above-mentioned history of left breast cancer treated with lumpectomy followed by adjuvant radiation.  She had Oncotype score of 19 and did not need chemotherapy.  She done extremely well from a radiation standpoint and is here today to discuss adjuvant treatment plan with antiestrogen therapy.  She has recovered very well from the effects of radiation.  REVIEW OF SYSTEMS:   Constitutional: Denies fevers, chills or abnormal weight loss Eyes: Denies blurriness of vision Ears, nose, mouth, throat, and face: Denies mucositis or sore throat Respiratory: Denies cough, dyspnea or wheezes Cardiovascular: Denies palpitation, chest discomfort Gastrointestinal:  Denies  nausea, heartburn or change in bowel habits Skin: Denies abnormal skin rashes Lymphatics: Denies new lymphadenopathy or easy bruising Neurological:Denies numbness, tingling or new weaknesses Behavioral/Psych: Mood is stable, no new changes  Extremities: No lower extremity edema Breast:  denies any pain or lumps or nodules in either breasts All other systems were reviewed with the patient and are negative.  I have reviewed the past medical history, past surgical history, social history and family history with the patient and they are unchanged from previous note.  ALLERGIES:  has No Known Allergies.  MEDICATIONS:  Current Outpatient Medications  Medication Sig Dispense Refill  . BD PEN NEEDLE NANO U/F 32G X 4 MM MISC     . diclofenac sodium (VOLTAREN) 1 % GEL Apply 1 application topically 2 (two) times daily.    Marland Kitchen gabapentin (NEURONTIN) 300 MG capsule Take 300 mg by mouth 2 (two) times daily.     . hyaluronate sodium (RADIAPLEXRX) GEL Apply 1 application topically 2 (two) times daily. Apply aftr rad txs and bedtime to breast area, nothing 4 hours prior to rad tx    . HYDROcodone-acetaminophen (NORCO/VICODIN) 5-325 MG tablet Take 1 tablet by mouth every 4 (four) hours as needed for moderate pain. 30 tablet 0  . insulin lispro (HUMALOG) 100 UNIT/ML injection Inject 10-15 Units into the skin 3 (three) times daily before meals.     . latanoprost (XALATAN) 0.005 % ophthalmic solution Place 1 drop into both eyes at bedtime.    Marland Kitchen loratadine (CLARITIN) 10 MG tablet Take 10 mg by mouth daily.    Marland Kitchen  methocarbamol (ROBAXIN) 500 MG tablet Take 500 mg by mouth 3 (three) times daily as needed for muscle spasms. Muscle spasm    . non-metallic deodorant (ALRA) MISC Apply 1 application topically.    Marland Kitchen NOVOLOG FLEXPEN 100 UNIT/ML FlexPen     . ONE TOUCH ULTRA TEST test strip     . sitaGLIPtan-metformin (JANUMET) 50-1000 MG per tablet Take 1 tablet by mouth daily.     . TRESIBA FLEXTOUCH 100 UNIT/ML SOPN  FlexTouch Pen Inject 15 Units as directed at bedtime.     No current facility-administered medications for this visit.     PHYSICAL EXAMINATION: ECOG PERFORMANCE STATUS: 1 - Symptomatic but completely ambulatory  Vitals:   06/27/17 0829  BP: (!) 114/57  Pulse: 63  Resp: 20  Temp: 98.5 F (36.9 C)  SpO2: 100%   Filed Weights   06/27/17 0829  Weight: 130 lb (59 kg)    GENERAL:alert, no distress and comfortable SKIN: skin color, texture, turgor are normal, no rashes or significant lesions EYES: normal, Conjunctiva are pink and non-injected, sclera clear OROPHARYNX:no exudate, no erythema and lips, buccal mucosa, and tongue normal  NECK: supple, thyroid normal size, non-tender, without nodularity LYMPH:  no palpable lymphadenopathy in the cervical, axillary or inguinal LUNGS: clear to auscultation and percussion with normal breathing effort HEART: regular rate & rhythm and no murmurs and no lower extremity edema ABDOMEN:abdomen soft, non-tender and normal bowel sounds MUSCULOSKELETAL:no cyanosis of digits and no clubbing  NEURO: alert & oriented x 3 with fluent speech, no focal motor/sensory deficits EXTREMITIES: No lower extremity edema  LABORATORY DATA:  I have reviewed the data as listed   Chemistry      Component Value Date/Time   NA 136 04/07/2017 0918   K 4.7 04/07/2017 0918   CL 103 04/07/2017 0918   CO2 26 04/07/2017 0918   BUN 15 04/07/2017 0918   CREATININE 0.75 04/07/2017 0918      Component Value Date/Time   CALCIUM 9.0 04/07/2017 0918       Lab Results  Component Value Date   WBC 15.7 (H) 10/15/2011   HGB 13.3 10/15/2011   HCT 39.0 10/15/2011   MCV 94.4 10/15/2011   PLT 259 10/15/2011   NEUTROABS 10.1 (H) 10/15/2011    ASSESSMENT & PLAN:  Carcinoma of upper-outer quadrant of left breast in female, estrogen receptor positive ([place]) 04/12/2017 Left lumpectomy: Fibrocystic changes; superior margin: Invasive pleomorphic lobular carcinoma grade 3,  1.5 cm, perineural invasion and lymphovascular invasion present, pleomorphic LCIS, margins negative, 0/5 lymph nodes negative, ER 100%, PR 100%, HER-2 negative, Ki-67 15% T1 CN 0 stage IA  Oncotype DX score 19: 12% risk of recurrence with tamoxifen alone over 10 years  Adjuvant radiation therapy 05/23/2017- 06/20/2017  Treatment plan: Adjuvant antiestrogen therapy with anastrozole 1 mg p.o. daily times 5-7 years  Anastrozole counseling: We discussed the risks and benefits of anti-estrogen therapy with aromatase inhibitors. These include but not limited to insomnia, hot flashes, mood changes, vaginal dryness, bone density loss, and weight gain. We strongly believe that the benefits far outweigh the risks. Patient understands these risks and consented to starting treatment. Planned treatment duration is 5-7 years.  Return to clinic in 3 months with survivorship care plan visit   I spent 25 minutes talking to the patient of which more than half was spent in counseling and coordination of care.  No orders of the defined types were placed in this encounter.  The patient has a good understanding of  the overall plan. she agrees with it. she will call with any problems that may develop before the next visit here.   Rulon Eisenmenger, MD 06/27/17

## 2017-06-27 NOTE — Telephone Encounter (Signed)
Gave patient avs report and appointments for February 2019.

## 2017-08-03 NOTE — Progress Notes (Signed)
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  Narrative:  The patient returns today for routine follow-up. During treatment she did very well with radiotherapy and did not have significant desquamation.                             On review of systems, the patient states she is doing well overall. She is using her radioplex still and has been tolerating her Arimidex with only mild hot flashes. She reports she's had hot flashes for years without cessation. She denies any concerns with her skin, and no other complaints are verbalized.  ALLERGIES:  has No Known Allergies.  Meds: Current Outpatient Medications  Medication Sig Dispense Refill  . anastrozole (ARIMIDEX) 1 MG tablet Take 1 tablet (1 mg total) daily by mouth. 90 tablet 3  . BD PEN NEEDLE NANO U/F 32G X 4 MM MISC     . diclofenac sodium (VOLTAREN) 1 % GEL Apply 1 application topically 2 (two) times daily.    Marland Kitchen gabapentin (NEURONTIN) 300 MG capsule Take 300 mg by mouth 2 (two) times daily.     Marland Kitchen HYDROcodone-acetaminophen (NORCO/VICODIN) 5-325 MG tablet Take 1 tablet by mouth every 4 (four) hours as needed for moderate pain. 30 tablet 0  . insulin lispro (HUMALOG) 100 UNIT/ML injection Inject 10-15 Units into the skin 3 (three) times daily before meals.     . latanoprost (XALATAN) 0.005 % ophthalmic solution Place 1 drop into both eyes at bedtime.    Marland Kitchen NOVOLOG FLEXPEN 100 UNIT/ML FlexPen     . ONE TOUCH ULTRA TEST test strip     .  sitaGLIPtan-metformin (JANUMET) 50-1000 MG per tablet Take 1 tablet by mouth daily.     . TRESIBA FLEXTOUCH 100 UNIT/ML SOPN FlexTouch Pen Inject 15 Units as directed at bedtime.    Marland Kitchen loratadine (CLARITIN) 10 MG tablet Take 10 mg by mouth daily.    . methocarbamol (ROBAXIN) 500 MG tablet Take 500 mg by mouth 3 (three) times daily as needed for muscle spasms. Muscle spasm     No current facility-administered medications for this encounter.     Physical Findings:  height is 5' 2.5" (1.588 m) and weight is 124 lb 12.8 oz (56.6 kg). Her oral temperature is 97.9 F (36.6 C). Her blood pressure is 116/67 and her pulse is 70. Her respiration is 18 and oxygen saturation is 100%.  Pain Assessment Pain Score: 0-No pain/10 In general this is a well appearing African American female in no acute distress. She's alert and oriented x4 and appropriate throughout the examination. Cardiopulmonary assessment is negative for acute distress and she exhibits normal effort. The left breast was examined and reveals no desquamation and only very mild hyperpigmentation.   Lab Findings: Lab Results  Component Value Date   WBC 15.7 (H) 10/15/2011   HGB 13.3 10/15/2011   HCT 39.0 10/15/2011  MCV 94.4 10/15/2011   PLT 259 10/15/2011     Radiographic Findings: No results found.  Impression/Plan: 1. 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 The patient has been doing well since completion of radiotherapy. We discussed that we would be happy to continue to follow her as needed, but she will also continue Arimidex and will follow up with Dr. Lindi Adie in medical oncology. She was counseled on skin care as well as measures to avoid sun exposure to this area.  2. Survivorship. She is scheduled for survivorship clinic in February 2019 and I encouraged her to attend this visit.     Carola Rhine, PAC

## 2017-08-04 ENCOUNTER — Other Ambulatory Visit: Payer: Self-pay | Admitting: Family Medicine

## 2017-08-04 ENCOUNTER — Ambulatory Visit
Admission: RE | Admit: 2017-08-04 | Discharge: 2017-08-04 | Disposition: A | Discharge status: Home / Self Care | Payer: PRIVATE HEALTH INSURANCE | Source: Ambulatory Visit | Attending: Radiation Oncology | Admitting: Radiation Oncology

## 2017-08-04 ENCOUNTER — Other Ambulatory Visit: Payer: Self-pay

## 2017-08-04 ENCOUNTER — Encounter: Payer: Self-pay | Admitting: Radiation Oncology

## 2017-08-04 VITALS — BP 116/67 | HR 70 | Temp 97.9°F | Resp 18 | Ht 62.5 in | Wt 124.8 lb

## 2017-08-04 DIAGNOSIS — Z170 Estrogen receptor positive status [ER+]: Secondary | ICD-10-CM | POA: Diagnosis not present

## 2017-08-04 DIAGNOSIS — C50912 Malignant neoplasm of unspecified site of left female breast: Secondary | ICD-10-CM | POA: Insufficient documentation

## 2017-08-04 DIAGNOSIS — C50412 Malignant neoplasm of upper-outer quadrant of left female breast: Secondary | ICD-10-CM

## 2017-08-04 DIAGNOSIS — Z79899 Other long term (current) drug therapy: Secondary | ICD-10-CM | POA: Insufficient documentation

## 2017-08-04 DIAGNOSIS — R109 Unspecified abdominal pain: Secondary | ICD-10-CM

## 2017-08-04 DIAGNOSIS — Z79811 Long term (current) use of aromatase inhibitors: Secondary | ICD-10-CM | POA: Insufficient documentation

## 2017-08-04 DIAGNOSIS — Z794 Long term (current) use of insulin: Secondary | ICD-10-CM | POA: Insufficient documentation

## 2017-08-04 DIAGNOSIS — Z923 Personal history of irradiation: Secondary | ICD-10-CM | POA: Insufficient documentation

## 2017-08-04 NOTE — Addendum Note (Signed)
Encounter addended by: Malena Edman, RN on: 08/04/2017 9:56 AM  Actions taken: Charge Capture section accepted

## 2017-08-08 ENCOUNTER — Ambulatory Visit
Admission: RE | Admit: 2017-08-08 | Discharge: 2017-08-08 | Disposition: A | Discharge status: Home / Self Care | Payer: No Typology Code available for payment source | Source: Ambulatory Visit | Attending: Family Medicine | Admitting: Family Medicine

## 2017-08-08 DIAGNOSIS — R109 Unspecified abdominal pain: Secondary | ICD-10-CM

## 2017-09-14 ENCOUNTER — Telehealth: Payer: Self-pay

## 2017-09-14 NOTE — Telephone Encounter (Signed)
Left VM for pt and confirmed appt with Wilber Bihari, NP for cancer survivorship care plan visit. Time and date provided.  Cyndia Bent RN

## 2017-09-21 ENCOUNTER — Telehealth: Payer: Self-pay

## 2017-09-21 NOTE — Telephone Encounter (Signed)
Rescheduled appointment due to patient will be out of town. Per 2/13 phone message returns

## 2017-09-30 ENCOUNTER — Encounter: Payer: No Typology Code available for payment source | Admitting: Adult Health

## 2017-10-04 ENCOUNTER — Telehealth: Payer: Self-pay

## 2017-10-04 NOTE — Telephone Encounter (Signed)
Spoke with pt reminding of [facility] appt on 10/11/17 at 2 pm with LC.  Pt stated she would be at appt.

## 2017-10-11 ENCOUNTER — Inpatient Hospital Stay: Payer: PRIVATE HEALTH INSURANCE | Attending: Adult Health | Admitting: Adult Health

## 2017-10-11 ENCOUNTER — Encounter: Payer: Self-pay | Admitting: Adult Health

## 2017-10-11 VITALS — BP 133/68 | HR 71 | Temp 98.4°F | Resp 18 | Ht 62.5 in | Wt 127.7 lb

## 2017-10-11 DIAGNOSIS — Z79811 Long term (current) use of aromatase inhibitors: Secondary | ICD-10-CM | POA: Insufficient documentation

## 2017-10-11 DIAGNOSIS — C50412 Malignant neoplasm of upper-outer quadrant of left female breast: Secondary | ICD-10-CM | POA: Diagnosis not present

## 2017-10-11 DIAGNOSIS — Z170 Estrogen receptor positive status [ER+]: Secondary | ICD-10-CM | POA: Diagnosis not present

## 2017-10-11 NOTE — Progress Notes (Signed)
CLINIC:  Survivorship   REASON FOR VISIT:  Routine follow-up post-treatment for a recent history of breast cancer.  BRIEF ONCOLOGIC HISTORY:    Carcinoma of upper-outer quadrant of left breast in female, estrogen receptor positive ([place])   03/10/2017 Initial Diagnosis    Invasive lobular cancer grade 2, ER 100%, PR 100%, HER-2 negative ratio 1.51, Ki-67 15%      03/23/2017 Breast MRI    Irregular mass in the retroareolar left breast 1.7 x 1 x 1.3 cm, no abnormal lymph nodes, T1 CN 0 stage IA       04/12/2017 Surgery    Left lumpectomy: Fibrocystic changes; superior margin: Invasive pleomorphic lobular carcinoma grade 3, 1.5 cm, perineural invasion and lymphovascular invasion present, pleomorphic LCIS, margins negative, 0/5 lymph nodes negative, ER 100%, PR 100%, HER-2 negative, Ki-67 15% T1 CN 0 stage IA      04/25/2017 Oncotype testing    Oncotype DX score 19: 12% risk of recurrence with tamoxifen alone over 10 years      05/23/2017 - 06/20/2017 Radiation Therapy    Adjuvant radiation therapy      06/27/2017 -  Anti-estrogen oral therapy    Anastrozole daily        INTERVAL HISTORY:  Breanna Pratt presents to the [facility] today for our initial meeting to review her survivorship care plan detailing her treatment course for breast cancer, as well as monitoring long-term side effects of that treatment, education regarding health maintenance, screening, and overall wellness and health promotion.     Overall, Breanna Pratt reports feeling quite well.  She is taking Anastrozole daily.  She is tolerating it well. She says she has mild hot flashes, osme vaginal dryness.  She has not noticed an increase in joint aches and pains.      REVIEW OF SYSTEMS:  Review of Systems  Constitutional: Negative for appetite change, chills, fatigue, fever and unexpected weight change.  HENT:   Negative for hearing loss, lump/mass, sore throat and trouble swallowing.   Eyes: Negative for eye  problems and icterus.  Respiratory: Negative for chest tightness, cough and shortness of breath.   Cardiovascular: Negative for chest pain, leg swelling and palpitations.  Gastrointestinal: Negative for abdominal distention, abdominal pain, constipation, diarrhea, nausea and vomiting.  Endocrine: Negative for hot flashes.  Musculoskeletal: Negative for arthralgias.  Skin: Negative for itching and rash.  Neurological: Negative for dizziness, extremity weakness, headaches and numbness.  Hematological: Negative for adenopathy.  Psychiatric/Behavioral: Negative for depression. The patient is not nervous/anxious.   Breast: Denies any new nodularity, masses, tenderness, nipple changes, or nipple discharge.      ONCOLOGY TREATMENT TEAM:  1. Surgeon:  Dr. Georgette Dover at [facility] 2. Medical Oncologist: Dr. Lindi Adie  3. Radiation Oncologist: Dr. Lisbeth Renshaw    PAST MEDICAL/SURGICAL HISTORY:  Past Medical History:  Diagnosis Date  . 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 Chronic back pain   . Diabetes mellitus    takes Metformin daily;takes Humalog takes  8in am .12u at lunch and 15 at dinner.Lantus 35units at bedtime;;Average fasting sugars 100-120  . Eczema   . GERD (gastroesophageal reflux disease)    Prevacid prn  . Hemorrhoids   . Herpes   . Osteopenia   . Peripheral neuropathy    takes Gabapentin   Past Surgical History:  Procedure Laterality Date  . ABDOMINAL HYSTERECTOMY  10+yrs ago  . BREAST LUMPECTOMY WITH RADIOACTIVE SEED AND SENTINEL LYMPH NODE BIOPSY Left 04/12/2017  Procedure: LEFT BREAST LUMPECTOMY WITH RADIOACTIVE SEED AND SENTINEL LYMPH NODE BIOPSY WITH BLUE DYE INJECTION;  Surgeon: Donnie Mesa, MD;  Location: [facility];  Service: General;  Laterality: Left;  . COLONOSCOPY  2010  . CYSTOSCOPY     as a teenager  . DILATION AND CURETTAGE OF UTERUS  30+yrs ago   x 2  . SHOULDER ARTHROSCOPY WITH ROTATOR CUFF REPAIR Bilateral   . TUBAL LIGATION        ALLERGIES:  No Known Allergies   CURRENT MEDICATIONS:  Outpatient Encounter Medications as of 10/11/2017  Medication Sig Note  . anastrozole (ARIMIDEX) 1 MG tablet Take 1 tablet (1 mg total) daily by mouth.   . BD PEN NEEDLE NANO U/F 32G X 4 MM MISC  03/14/2014: Received from: External Pharmacy  . diclofenac sodium (VOLTAREN) 1 % GEL Apply 1 application topically 2 (two) times daily.   Marland Kitchen gabapentin (NEURONTIN) 300 MG capsule Take 300 mg by mouth 2 (two) times daily.    Marland Kitchen HYDROcodone-acetaminophen (NORCO/VICODIN) 5-325 MG tablet Take 1 tablet by mouth every 4 (four) hours as needed for moderate pain.   Marland Kitchen insulin lispro (HUMALOG) 100 UNIT/ML injection Inject 10-15 Units into the skin 3 (three) times daily before meals.  08/27/2015: Pt last dose was 15 units.  Marland Kitchen latanoprost (XALATAN) 0.005 % ophthalmic solution Place 1 drop into both eyes at bedtime.   Marland Kitchen loratadine (CLARITIN) 10 MG tablet Take 10 mg by mouth daily.   . methocarbamol (ROBAXIN) 500 MG tablet Take 500 mg by mouth 3 (three) times daily as needed for muscle spasms. Muscle spasm   . NOVOLOG FLEXPEN 100 UNIT/ML FlexPen  03/14/2014: Received from: External Pharmacy  . ONE TOUCH ULTRA TEST test strip  03/14/2014: Received from: External Pharmacy  . sitaGLIPtan-metformin (JANUMET) 50-1000 MG per tablet Take 1 tablet by mouth daily.    . TRESIBA FLEXTOUCH 100 UNIT/ML SOPN FlexTouch Pen Inject 15 Units as directed at bedtime.   . [DISCONTINUED] lisinopril (PRINIVIL,ZESTRIL) 2.5 MG tablet Take 2.5 mg by mouth daily.     . [DISCONTINUED] metFORMIN (GLUCOPHAGE) 500 MG tablet Take 500 mg by mouth daily.      No facility-administered encounter medications on file as of 10/11/2017.      ONCOLOGIC FAMILY HISTORY:  Family History  Problem Relation Age of Onset  . Other Mother        brain tumor  . Other Father        gsw complications  . Diabetes Father   . Other Maternal Grandfather        brain tumor  . Anesthesia problems Neg Hx   .  Hypotension Neg Hx   . Malignant hyperthermia Neg Hx   . Pseudochol deficiency Neg Hx   . Breast cancer Neg Hx      GENETIC COUNSELING/TESTING: Not indicated at this point  SOCIAL HISTORY:  Santos Hardwick is single and lives with her daughter and two grandsons in [place], [place].  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  She denies any current or history of tobacco, alcohol, or illicit drug use.     PHYSICAL EXAMINATION:  Vital Signs:   Vitals:   10/11/17 1408  BP: 133/68  Pulse: 71  Resp: 18  Temp: 98.4 F (36.9 C)  SpO2: 100%   Filed Weights   10/11/17 1408  Weight: 127 lb 11.2 oz (57.9 kg)   General: Well-nourished, well-appearing female in no acute distress.  She is unaccompanied today.   HEENT: Head is normocephalic.  Pupils equal and reactive to light. Conjunctivae clear without exudate.  Sclerae anicteric. Oral mucosa is pink, moist.  Oropharynx is pink without lesions or erythema.  Lymph: No cervical, supraclavicular, or infraclavicular lymphadenopathy noted on palpation.  Cardiovascular: Regular rate and rhythm.Marland Kitchen Respiratory: Clear to auscultation bilaterally. Chest expansion symmetric; breathing non-labored.  Breasts: left breast s/p lumpectomy, no nodularity, mass, sign of recurrence, right breast without nodules, masses, skin or nipple changes.   GI: Abdomen soft and round; non-tender, non-distended. Bowel sounds normoactive.  GU: Deferred.  Neuro: No focal deficits. Steady gait.  Psych: Mood and affect normal and appropriate for situation.  Extremities: No edema. MSK: No focal spinal tenderness to palpation.  Full range of motion in bilateral upper extremities Skin: Warm and dry.  LABORATORY DATA:  None for this visit.  DIAGNOSTIC IMAGING:  None for this visit.      ASSESSMENT AND PLAN:  Ms.. Pratt is a pleasant 62 y.o. female with Stage IA left breast invasive ductal carcinoma, ER+/PR+/HER2-, diagnosed in  03/2007, treated with lumpectomy, adjuvant radiation therapy, and anti-estrogen therapy with Anastrozole beginning in 06/2017.  She presents to the Survivorship Clinic for our initial meeting and routine follow-up post-completion of treatment for breast cancer.    1. Stage IA left breast cancer:  Breanna Pratt is continuing to recover from definitive treatment for breast cancer. She will follow-up with her medical oncologist, Dr. Lindi Adie in 7 months with history and physical exam per surveillance protocol.  She will continue her anti-estrogen therapy with Anastrozole. Thus far, she is tolerating the Anastrozole well, with minimal side effects. She was instructed to make Dr. Lindi Adie or myself aware if she begins to experience any worsening side effects of the medication and I could see her back in clinic to help manage those side effects, as needed.  Today, a comprehensive survivorship care plan and treatment summary was reviewed with the patient today detailing her breast cancer diagnosis, treatment course, potential late/long-term effects of treatment, appropriate follow-up care with recommendations for the future, and patient education resources.  A copy of this summary, along with a letter will be sent to the patient's primary care provider via mail/fax/In Basket message after today's visit.    2. Bone health:  Given Breanna Pratt's age/history of breast cancer and her current treatment regimen including anti-estrogen therapy with Anastrozole, she is at risk for bone demineralization.  She did have a dexa last week and is awaiting those results from her gyn.  In the meantime, she was encouraged to increase her consumption of foods rich in calcium, as well as increase her weight-bearing activities.  She was given education on specific activities to promote bone health.  3. Cancer screening:  Due to Breanna Pratt's history and her age, she should receive screening for skin cancers, colon cancer, and gynecologic cancers.   The information and recommendations are listed on the patient's comprehensive care plan/treatment summary and were reviewed in detail with the patient.    4. Health maintenance and wellness promotion: Breanna Pratt was encouraged to consume 5-7 servings of fruits and vegetables per day. We reviewed the "Nutrition Rainbow" handout, as well as the handout "Take Control of Your Health and Reduce Your Cancer Risk" from the [place].  She was also encouraged to engage in moderate to vigorous exercise for 30 minutes per day most days of the week. We discussed the [employer] fitness program, which is designed for  cancer survivors to help them become more physically fit after cancer treatments.  She was instructed to limit her alcohol consumption and continue to abstain from tobacco use.     5. Support services/counseling: It is not uncommon for this period of the patient's cancer care trajectory to be one of many emotions and stressors.  We discussed an opportunity for her to participate in the next session of [facility] ("Finding Your New Normal") support group series designed for patients after they have completed treatment.   Breanna Pratt was encouraged to take advantage of our many other support services programs, support groups, and/or counseling in coping with her new life as a cancer survivor after completing anti-cancer treatment.  She was offered support today through active listening and expressive supportive counseling.  She was given information regarding our available services and encouraged to contact me with any questions or for help enrolling in any of our support group/programs.    Dispo:   -Return to cancer center in 7 months with Dr. Lindi Adie -Mammogram due in 02/28/2018 -Bone density due 10/07/2019 -Follow up with Dr. Georgette Dover in April, 2019 -She is welcome to return back to the Survivorship Clinic at any time; no additional follow-up needed at this time.  -Consider referral back to  survivorship as a long-term survivor for continued surveillance  A total of (30) minutes of face-to-face time was spent with this patient with greater than 50% of that time in counseling and care-coordination.   Gardenia Phlegm, NP Survivorship Program [facility] [phone]   Note: PRIMARY CARE PROVIDER Vernie Shanks, [place] [phone]

## 2017-10-26 ENCOUNTER — Encounter: Payer: PRIVATE HEALTH INSURANCE | Attending: Family Medicine | Admitting: *Deleted

## 2017-10-26 DIAGNOSIS — Z713 Dietary counseling and surveillance: Secondary | ICD-10-CM | POA: Diagnosis present

## 2017-10-26 DIAGNOSIS — E119 Type 2 diabetes mellitus without complications: Secondary | ICD-10-CM | POA: Insufficient documentation

## 2017-10-27 NOTE — Patient Instructions (Signed)
Plan:  Aim for 2 Carb Choices per meal (30 grams) +/- 1 either way  Aim for 0-1 Carbs per snack if hungry  Include protein in moderation with your meals and snacks Consider reading food labels for Total Carbohydrate of foods Continue with your activity level by walking daily for 30 or more minutes daily as tolerated Consider checking BG at alternate times per day  Continue taking medication as directed by MD Consider asking your MD about doing a C-peptide lab test to determine if your body is making insulin or not?
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
## 2018-04-11 DIAGNOSIS — H401133 Primary open-angle glaucoma, bilateral, severe stage: Secondary | ICD-10-CM | POA: Diagnosis not present

## 2018-06-19 ENCOUNTER — Other Ambulatory Visit: Payer: Self-pay | Admitting: Surgery

## 2018-06-19 DIAGNOSIS — Z853 Personal history of malignant neoplasm of breast: Secondary | ICD-10-CM

## 2018-06-19 DIAGNOSIS — C50912 Malignant neoplasm of unspecified site of left female breast: Secondary | ICD-10-CM | POA: Diagnosis not present

## 2018-06-19 DIAGNOSIS — Z9889 Other specified postprocedural states: Secondary | ICD-10-CM

## 2018-06-20 ENCOUNTER — Other Ambulatory Visit: Payer: Self-pay | Admitting: Hematology and Oncology

## 2018-06-20 ENCOUNTER — Ambulatory Visit
Admission: RE | Admit: 2018-06-20 | Discharge: 2018-06-20 | Disposition: A | Discharge status: Home / Self Care | Payer: BLUE CROSS/BLUE SHIELD | Source: Ambulatory Visit | Attending: Surgery | Admitting: Surgery

## 2018-06-20 DIAGNOSIS — Z853 Personal history of malignant neoplasm of breast: Secondary | ICD-10-CM

## 2018-06-20 DIAGNOSIS — Z9889 Other specified postprocedural states: Secondary | ICD-10-CM

## 2018-06-20 DIAGNOSIS — R928 Other abnormal and inconclusive findings on diagnostic imaging of breast: Secondary | ICD-10-CM | POA: Diagnosis not present

## 2018-06-20 HISTORY — DX: Personal history of irradiation: Z92.3

## 2018-06-21 ENCOUNTER — Telehealth: Payer: Self-pay | Admitting: Hematology and Oncology

## 2018-06-21 NOTE — Telephone Encounter (Signed)
Per 11/13 sch msg.  Called patient to schedule 6 month follow-up appt.  Patient preferred a Tues.

## 2018-06-27 DIAGNOSIS — E119 Type 2 diabetes mellitus without complications: Secondary | ICD-10-CM | POA: Diagnosis not present

## 2018-06-27 DIAGNOSIS — E1140 Type 2 diabetes mellitus with diabetic neuropathy, unspecified: Secondary | ICD-10-CM | POA: Diagnosis not present

## 2018-06-27 DIAGNOSIS — M8580 Other specified disorders of bone density and structure, unspecified site: Secondary | ICD-10-CM | POA: Diagnosis not present

## 2018-06-27 DIAGNOSIS — E559 Vitamin D deficiency, unspecified: Secondary | ICD-10-CM | POA: Diagnosis not present

## 2018-08-16 DIAGNOSIS — E1140 Type 2 diabetes mellitus with diabetic neuropathy, unspecified: Secondary | ICD-10-CM | POA: Diagnosis not present

## 2018-08-16 DIAGNOSIS — E1165 Type 2 diabetes mellitus with hyperglycemia: Secondary | ICD-10-CM | POA: Diagnosis not present

## 2018-08-16 DIAGNOSIS — R300 Dysuria: Secondary | ICD-10-CM | POA: Diagnosis not present

## 2018-08-16 DIAGNOSIS — N390 Urinary tract infection, site not specified: Secondary | ICD-10-CM | POA: Diagnosis not present

## 2018-09-07 DIAGNOSIS — H401133 Primary open-angle glaucoma, bilateral, severe stage: Secondary | ICD-10-CM | POA: Diagnosis not present

## 2018-09-13 DIAGNOSIS — Z01411 Encounter for gynecological examination (general) (routine) with abnormal findings: Secondary | ICD-10-CM | POA: Diagnosis not present

## 2018-09-26 ENCOUNTER — Other Ambulatory Visit: Payer: Self-pay | Admitting: Hematology and Oncology

## 2018-09-27 DIAGNOSIS — L308 Other specified dermatitis: Secondary | ICD-10-CM | POA: Diagnosis not present

## 2018-09-27 DIAGNOSIS — L304 Erythema intertrigo: Secondary | ICD-10-CM | POA: Diagnosis not present

## 2018-12-13 DIAGNOSIS — E1140 Type 2 diabetes mellitus with diabetic neuropathy, unspecified: Secondary | ICD-10-CM | POA: Diagnosis not present

## 2018-12-13 DIAGNOSIS — E1165 Type 2 diabetes mellitus with hyperglycemia: Secondary | ICD-10-CM | POA: Diagnosis not present

## 2018-12-14 NOTE — Assessment & Plan Note (Signed)
09/04/2018Left lumpectomy: Fibrocystic changes; superior margin: Invasive pleomorphic lobular carcinoma grade 3, 1.5 cm, perineural invasion and lymphovascular invasion present, pleomorphic LCIS, margins negative, 0/5 lymph nodes negative, ER 100%, PR 100%, HER-2 negative, Ki-67 15% T1 CN 0 stage IA  Oncotype DX score 19: 12% risk of recurrence with tamoxifen alone over 10 years  Adjuvant radiation therapy 05/23/2017- 06/20/2017  Treatment plan: Adjuvant antiestrogen therapy with anastrozole 1 mg p.o. daily times 5-7 years  Anastrozole toxicities:  Breast cancer surveillance: 06/20/2018: Benign breast density category B  Return to clinic in 1 year for follow-up

## 2018-12-18 ENCOUNTER — Telehealth: Payer: Self-pay | Admitting: Hematology and Oncology

## 2018-12-18 NOTE — Progress Notes (Signed)
HEMATOLOGY-ONCOLOGY DOXIMITY VISIT PROGRESS NOTE  I connected with Breanna Pratt on 12/19/2018 at  9:30 AM EDT by Doximity video conference and verified that I am speaking with the correct person using two identifiers.  I discussed the limitations, risks, security and privacy concerns of performing an evaluation and management service by Doximity and the availability of in person appointments.  I also discussed with the patient that there may be a patient responsible charge related to this service. The patient expressed understanding and agreed to proceed.  Patient's Location: Home Physician Location: Clinic  CHIEF COMPLIANT: Follow-up of left breast cancer on anastrozole  INTERVAL HISTORY: Breanna Pratt is a 63 y.o. Breanna Pratt with above-mentioned history of left breast cancer treated with lumpectomy followed by adjuvant radiation and is currently on antiestrogen therapy with anastrozole. I last saw her 1.5 years ago. In the interim she saw Wilber Bihari, NP for [facility]. Her most recent mammogram on 06/20/18 showed no evidence of malignancy bilaterally. She presents today over Doximity for annual follow-up.     Carcinoma of upper-outer quadrant of left breast in female, estrogen receptor positive ([place])   03/10/2017 Initial Diagnosis    Invasive lobular cancer grade 2, ER 100%, PR 100%, HER-2 negative ratio 1.51, Ki-67 15%    03/23/2017 Breast MRI    Irregular mass in the retroareolar left breast 1.7 x 1 x 1.3 cm, no abnormal lymph nodes, T1 CN 0 stage IA    04/12/2017 Surgery    Left lumpectomy: Fibrocystic changes; superior margin: Invasive pleomorphic lobular carcinoma grade 3, 1.5 cm, perineural invasion and lymphovascular invasion present, pleomorphic LCIS, margins negative, 0/5 lymph nodes negative, ER 100%, PR 100%, HER-2 negative, Ki-67 15% T1 CN 0 stage IA    04/25/2017 Oncotype testing    Oncotype DX score 19: 12% risk of recurrence with tamoxifen alone over 10 years    05/23/2017 - 06/20/2017 Radiation Therapy    Adjuvant radiation therapy    06/27/2017 -  Anti-estrogen oral therapy    Anastrozole daily     REVIEW OF SYSTEMS:   Constitutional: Denies fevers, chills or abnormal weight loss Eyes: Denies blurriness of vision Ears, nose, mouth, throat, and face: Denies mucositis or sore throat Respiratory: Denies cough, dyspnea or wheezes Cardiovascular: Denies palpitation, chest discomfort Gastrointestinal:  Denies nausea, heartburn or change in bowel habits Skin: Denies abnormal skin rashes Lymphatics: Denies new lymphadenopathy or easy bruising Neurological:Denies numbness, tingling or new weaknesses Behavioral/Psych: Mood is stable, no new changes  Extremities: No lower extremity edema Breast: denies any pain or lumps or nodules in either breasts All other systems were reviewed with the patient and are negative.  Observations/Objective:  There were no vitals filed for this visit. There is no height or weight on file to calculate BMI.  I have reviewed the data as listed CMP Latest Ref Rng & Units 04/07/2017 04/01/2017 10/15/2011  Glucose 65 - 99 mg/dL 292(H) 152(H) 126(H)  BUN 6 - 20 mg/dL '15 10 17  '$ Creatinine 0.44 - 1.00 mg/dL 0.75 0.70 0.80  Sodium 135 - 145 mmol/L 136 141 141  Potassium 3.5 - 5.1 mmol/L 4.7 4.3 3.7  Chloride 101 - 111 mmol/L 103 106 105  CO2 22 - 32 mmol/L 26 29 -  Calcium 8.9 - 10.3 mg/dL 9.0 9.0 -    Lab Results  Component Value Date   WBC 15.7 (H) 10/15/2011   HGB 13.3 10/15/2011   HCT 39.0 10/15/2011   MCV 94.4 10/15/2011   PLT 259 10/15/2011  NEUTROABS 10.1 (H) 10/15/2011      Assessment Plan:  Carcinoma of upper-outer quadrant of left breast in female, estrogen receptor positive ([place]) 09/04/2018Left lumpectomy: Fibrocystic changes; superior margin: Invasive pleomorphic lobular carcinoma grade 3, 1.5 cm, perineural invasion and lymphovascular invasion present, pleomorphic LCIS, margins negative, 0/5 lymph  nodes negative, ER 100%, PR 100%, HER-2 negative, Ki-67 15% T1 CN 0 stage IA  Oncotype DX score 19: 12% risk of recurrence with tamoxifen alone over 10 years  Adjuvant radiation therapy 05/23/2017- 06/20/2017  Treatment plan: Adjuvant antiestrogen therapy with anastrozole 1 mg p.o. daily times 5-7 years  Anastrozole toxicities: Denies any hot flashes.  She does have joint and muscle stiffness.  She also has a trigger thumb. She is going to apply over-the-counter medication for this.    Breast cancer surveillance: 06/20/2018: Benign breast density category B  Return to clinic in 1 year for follow-up  I discussed the assessment and treatment plan with the patient. The patient was provided an opportunity to ask questions and all were answered. The patient agreed with the plan and demonstrated an understanding of the instructions. The patient was advised to call back or seek an in-person evaluation if the symptoms worsen or if the condition fails to improve as anticipated.   I provided 12 minutes of face-to-face Doximity time during this encounter.    Rulon Eisenmenger, MD 12/19/2018   I, Molly Dorshimer, am acting as scribe for Nicholas Lose, MD.  I have reviewed the above documentation for accuracy and completeness, and I agree with the above.

## 2018-12-18 NOTE — Telephone Encounter (Signed)
Called regarding upcoming Webex appointment, patient preferred to do Doximity. Patient is notified of time and date.

## 2018-12-19 ENCOUNTER — Inpatient Hospital Stay: Payer: PRIVATE HEALTH INSURANCE | Attending: Hematology and Oncology | Admitting: Hematology and Oncology

## 2018-12-19 DIAGNOSIS — Z79810 Long term (current) use of selective estrogen receptor modulators (SERMs): Secondary | ICD-10-CM | POA: Diagnosis not present

## 2018-12-19 DIAGNOSIS — Z170 Estrogen receptor positive status [ER+]: Secondary | ICD-10-CM

## 2018-12-19 DIAGNOSIS — Z923 Personal history of irradiation: Secondary | ICD-10-CM

## 2018-12-19 DIAGNOSIS — C50412 Malignant neoplasm of upper-outer quadrant of left female breast: Secondary | ICD-10-CM | POA: Diagnosis not present

## 2018-12-19 MED ORDER — ANASTROZOLE 1 MG PO TABS: 1.0000 mg | ORAL_TABLET | Freq: Every day | ORAL | 3 refills | Status: DC

## 2018-12-28 DIAGNOSIS — E559 Vitamin D deficiency, unspecified: Secondary | ICD-10-CM | POA: Diagnosis not present

## 2018-12-28 DIAGNOSIS — E1140 Type 2 diabetes mellitus with diabetic neuropathy, unspecified: Secondary | ICD-10-CM | POA: Diagnosis not present

## 2018-12-28 DIAGNOSIS — E538 Deficiency of other specified B group vitamins: Secondary | ICD-10-CM | POA: Diagnosis not present

## 2018-12-28 DIAGNOSIS — M8580 Other specified disorders of bone density and structure, unspecified site: Secondary | ICD-10-CM | POA: Diagnosis not present

## 2018-12-28 DIAGNOSIS — E119 Type 2 diabetes mellitus without complications: Secondary | ICD-10-CM | POA: Diagnosis not present

## 2019-01-10 DIAGNOSIS — M65312 Trigger thumb, left thumb: Secondary | ICD-10-CM | POA: Diagnosis not present

## 2019-01-10 DIAGNOSIS — M79645 Pain in left finger(s): Secondary | ICD-10-CM | POA: Diagnosis not present

## 2019-01-16 DIAGNOSIS — H401133 Primary open-angle glaucoma, bilateral, severe stage: Secondary | ICD-10-CM | POA: Diagnosis not present

## 2019-01-18 DIAGNOSIS — M65312 Trigger thumb, left thumb: Secondary | ICD-10-CM | POA: Diagnosis not present

## 2019-01-31 DIAGNOSIS — L84 Corns and callosities: Secondary | ICD-10-CM | POA: Diagnosis not present

## 2019-01-31 DIAGNOSIS — M205X1 Other deformities of toe(s) (acquired), right foot: Secondary | ICD-10-CM | POA: Diagnosis not present

## 2019-01-31 DIAGNOSIS — B351 Tinea unguium: Secondary | ICD-10-CM | POA: Diagnosis not present

## 2019-01-31 DIAGNOSIS — M205X2 Other deformities of toe(s) (acquired), left foot: Secondary | ICD-10-CM | POA: Diagnosis not present

## 2019-01-31 DIAGNOSIS — E1351 Other specified diabetes mellitus with diabetic peripheral angiopathy without gangrene: Secondary | ICD-10-CM | POA: Diagnosis not present

## 2019-02-12 DIAGNOSIS — L603 Nail dystrophy: Secondary | ICD-10-CM | POA: Diagnosis not present

## 2019-02-19 DIAGNOSIS — K640 First degree hemorrhoids: Secondary | ICD-10-CM | POA: Diagnosis not present

## 2019-02-19 DIAGNOSIS — Z86010 Personal history of colonic polyps: Secondary | ICD-10-CM | POA: Diagnosis not present

## 2019-03-09 DIAGNOSIS — E1165 Type 2 diabetes mellitus with hyperglycemia: Secondary | ICD-10-CM | POA: Diagnosis not present

## 2019-03-10 HISTORY — PX: TRIGGER FINGER RELEASE: SHX641

## 2019-03-13 DIAGNOSIS — Z20828 Contact with and (suspected) exposure to other viral communicable diseases: Secondary | ICD-10-CM | POA: Diagnosis not present

## 2019-03-13 DIAGNOSIS — Z7189 Other specified counseling: Secondary | ICD-10-CM | POA: Diagnosis not present

## 2019-04-09 DIAGNOSIS — J029 Acute pharyngitis, unspecified: Secondary | ICD-10-CM | POA: Diagnosis not present

## 2019-04-10 DIAGNOSIS — Z87442 Personal history of urinary calculi: Secondary | ICD-10-CM | POA: Diagnosis not present

## 2019-04-10 DIAGNOSIS — R109 Unspecified abdominal pain: Secondary | ICD-10-CM | POA: Diagnosis not present

## 2019-04-10 DIAGNOSIS — R112 Nausea with vomiting, unspecified: Secondary | ICD-10-CM | POA: Diagnosis not present

## 2019-04-11 ENCOUNTER — Emergency Department (HOSPITAL_COMMUNITY): Payer: BC Managed Care – PPO

## 2019-04-11 ENCOUNTER — Encounter (HOSPITAL_COMMUNITY): Payer: Self-pay | Admitting: Emergency Medicine

## 2019-04-11 ENCOUNTER — Emergency Department (HOSPITAL_COMMUNITY)
Admission: EM | Admit: 2019-04-11 | Discharge: 2019-04-11 | Disposition: A | Discharge status: Home / Self Care | Payer: BC Managed Care – PPO | Attending: Emergency Medicine | Admitting: Emergency Medicine

## 2019-04-11 DIAGNOSIS — Z7984 Long term (current) use of oral hypoglycemic drugs: Secondary | ICD-10-CM | POA: Diagnosis not present

## 2019-04-11 DIAGNOSIS — R110 Nausea: Secondary | ICD-10-CM | POA: Insufficient documentation

## 2019-04-11 DIAGNOSIS — R51 Headache: Secondary | ICD-10-CM | POA: Diagnosis not present

## 2019-04-11 DIAGNOSIS — E119 Type 2 diabetes mellitus without complications: Secondary | ICD-10-CM | POA: Insufficient documentation

## 2019-04-11 DIAGNOSIS — G4489 Other headache syndrome: Secondary | ICD-10-CM | POA: Diagnosis not present

## 2019-04-11 DIAGNOSIS — Z853 Personal history of malignant neoplasm of breast: Secondary | ICD-10-CM | POA: Diagnosis not present

## 2019-04-11 DIAGNOSIS — R531 Weakness: Secondary | ICD-10-CM | POA: Insufficient documentation

## 2019-04-11 DIAGNOSIS — Z79899 Other long term (current) drug therapy: Secondary | ICD-10-CM | POA: Insufficient documentation

## 2019-04-11 DIAGNOSIS — R4182 Altered mental status, unspecified: Secondary | ICD-10-CM | POA: Diagnosis not present

## 2019-04-11 LAB — CBC
HCT: 40.6 % (ref 36.0–46.0)
Hemoglobin: 13.6 g/dL (ref 12.0–15.0)
MCH: 33.9 pg (ref 26.0–34.0)
MCHC: 33.5 g/dL (ref 30.0–36.0)
MCV: 101.2 fL — ABNORMAL HIGH (ref 80.0–100.0)
Platelets: 261 10*3/uL (ref 150–400)
RBC: 4.01 MIL/uL (ref 3.87–5.11)
RDW: 13.1 % (ref 11.5–15.5)
WBC: 7.4 10*3/uL (ref 4.0–10.5)
nRBC: 0 % (ref 0.0–0.2)

## 2019-04-11 LAB — BASIC METABOLIC PANEL
Anion gap: 11 (ref 5–15)
BUN: 19 mg/dL (ref 8–23)
CO2: 22 mmol/L (ref 22–32)
Calcium: 10.1 mg/dL (ref 8.9–10.3)
Chloride: 102 mmol/L (ref 98–111)
Creatinine, Ser: 1.06 mg/dL — ABNORMAL HIGH (ref 0.44–1.00)
GFR calc Af Amer: >60 mL/min (ref >60)
GFR calc non Af Amer: 56 mL/min — ABNORMAL LOW (ref >60)
Glucose, Bld: 319 mg/dL — ABNORMAL HIGH (ref 70–99)
Potassium: 4.5 mmol/L (ref 3.5–5.1)
Sodium: 135 mmol/L (ref 135–145)

## 2019-04-11 LAB — URINALYSIS, ROUTINE W REFLEX MICROSCOPIC
Bacteria, UA: NONE SEEN
Bilirubin Urine: NEGATIVE
Glucose, UA: >=500 mg/dL — AB
Hgb urine dipstick: NEGATIVE
Ketones, ur: 5 mg/dL — AB
Leukocytes,Ua: NEGATIVE
Nitrite: NEGATIVE
Protein, ur: NEGATIVE mg/dL
Specific Gravity, Urine: 1.015 (ref 1.005–1.030)
pH: 5 (ref 5.0–8.0)

## 2019-04-11 MED ORDER — SODIUM CHLORIDE 0.9 % IV SOLN: INTRAVENOUS | Status: DC

## 2019-04-11 MED ORDER — SODIUM CHLORIDE 0.9 % IV BOLUS: 1000.0000 mL | Freq: Once | INTRAVENOUS | Status: DC

## 2019-04-11 MED ORDER — METOCLOPRAMIDE HCL 5 MG/ML IJ SOLN
5.0000 mg | Freq: Once | INTRAMUSCULAR | Status: DC
  Filled 2019-04-11: qty 2

## 2019-04-11 NOTE — ED Notes (Signed)
Patient verbalizes understanding of discharge instructions . Opportunity for questions and answers were provided . Armband removed by staff ,Pt discharged from ED. W/C  offered at D/C  and Declined W/C at D/C and was escorted to lobby by RN.  

## 2019-04-11 NOTE — ED Notes (Signed)
Pt aware of another UA need for culture

## 2019-04-11 NOTE — ED Triage Notes (Signed)
2 ED nurses attempted to obtain Iv access but unable to. IV consult placed.

## 2019-04-11 NOTE — ED Provider Notes (Signed)
[facility]PART[facility] Provider Note   CSN: [id number] Arrival date & time: 04/11/19  0827     History   Chief Complaint Chief Complaint  Patient presents with  . Headache    HPI Breanna Pratt is a 63 y.o. female.     63 year old female presents with several days of weakness and headache.  Patient feels that this may be a migraine although she states that she has no prior history of same.  Denies any fever or chills.  No neck discomfort.  Headache is frontal and dull.  States that when becomes worse she has nausea and has whole body weakness without focality.  Has had some intermittent flank pain but none currently.  Denies any materia or dysuria.  No abdominal discomfort.  Patient saw her doctor on Monday and had a strep test that was negative due to having some sore throat which she says is since resolved.  Had a virtual visit yesterday for the symptoms and was told today to come here for further evaluation.     Past Medical History:  Diagnosis Date  . 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 Chronic back pain   . Diabetes mellitus    takes Metformin daily;takes Humalog takes  8in am .12u at lunch and 15 at dinner.Lantus 35units at bedtime;;Average fasting sugars 100-120  . Eczema   . GERD (gastroesophageal reflux disease)    Prevacid prn  . Hemorrhoids   . Herpes   . Osteopenia   . Peripheral neuropathy    takes Gabapentin  . Personal history of radiation therapy     Patient Active Problem List   Diagnosis Date Noted  . Carcinoma of upper-outer quadrant of left breast in female, estrogen receptor positive ([place]) 03/18/2017  . Palpitations 05/12/2015  . Rotator cuff (capsule) sprain 07/23/2011    Past Surgical History:  Procedure Laterality Date  . ABDOMINAL HYSTERECTOMY  10+yrs ago  . BREAST LUMPECTOMY WITH RADIOACTIVE SEED AND SENTINEL LYMPH NODE BIOPSY Left 04/12/2017   Procedure: LEFT BREAST LUMPECTOMY WITH RADIOACTIVE SEED AND SENTINEL LYMPH NODE  BIOPSY WITH BLUE DYE INJECTION;  Surgeon: Donnie Mesa, MD;  Location: [facility];  Service: General;  Laterality: Left;  . COLONOSCOPY  2010  . CYSTOSCOPY     as a teenager  . DILATION AND CURETTAGE OF UTERUS  30+yrs ago   x 2  . SHOULDER ARTHROSCOPY WITH ROTATOR CUFF REPAIR Bilateral   . TUBAL LIGATION       OB History   No obstetric history on file.      Home Medications    Prior to Admission medications   Medication Sig Start Date End Date Taking? Authorizing Provider  anastrozole (ARIMIDEX) 1 MG tablet Take 1 tablet (1 mg total) by mouth daily. 12/19/18   Nicholas Lose, MD  BD PEN NEEDLE NANO U/F 32G X 4 MM MISC  01/21/14   [provider]  diclofenac sodium (VOLTAREN) 1 % GEL Apply 1 application topically 2 (two) times daily. 03/09/17   [provider]  gabapentin (NEURONTIN) 300 MG capsule Take 300 mg by mouth 2 (two) times daily.     [provider]  insulin lispro (HUMALOG) 100 UNIT/ML injection Inject 10-15 Units into the skin 3 (three) times daily before meals.     [provider]  latanoprost (XALATAN) 0.005 % ophthalmic solution Place 1 drop into both eyes at bedtime. 04/13/17   [provider]  loratadine (CLARITIN) 10 MG  tablet Take 10 mg by mouth daily.    [provider]  NOVOLOG FLEXPEN 100 UNIT/ML FlexPen  12/26/13   [provider]  ONE TOUCH ULTRA TEST test strip  02/20/14   [provider]  sitaGLIPtan-metformin (JANUMET) 50-1000 MG per tablet Take 1 tablet by mouth daily.     [provider]  TRESIBA FLEXTOUCH 100 UNIT/ML SOPN FlexTouch Pen Inject 15 Units as directed at bedtime. 01/29/17   [provider]  lisinopril (PRINIVIL,ZESTRIL) 2.5 MG tablet Take 2.5 mg by mouth daily.    10/15/11  [provider]  metFORMIN (GLUCOPHAGE) 500 MG tablet Take 500 mg by mouth daily.    10/15/11  [provider]    Family History Family History  Problem  Relation Age of Onset  . Other Mother        brain tumor  . Other Father        gsw complications  . Diabetes Father   . Other Maternal Grandfather        brain tumor  . Anesthesia problems Neg Hx   . Hypotension Neg Hx   . Malignant hyperthermia Neg Hx   . Pseudochol deficiency Neg Hx   . Breast cancer Neg Hx     Social History Social History   Tobacco Use  . Smoking status: Never Smoker  . Smokeless tobacco: Never Used  Substance Use Topics  . Alcohol use: No  . Drug use: No     Allergies   Patient has no known allergies.   Review of Systems Review of Systems  All other systems reviewed and are negative.    Physical Exam Updated Vital Signs BP 113/64 (BP Location: Right Arm)   Pulse 65   Temp 97.6 F (36.4 C) (Oral)   Resp 20   SpO2 100%   Physical Exam Vitals signs and nursing note reviewed.  Constitutional:      General: She is not in acute distress.    Appearance: Normal appearance. She is well-developed. She is not toxic-appearing.  HENT:     Head: Normocephalic and atraumatic.  Eyes:     General: Lids are normal.     Conjunctiva/sclera: Conjunctivae normal.     Pupils: Pupils are equal, round, and reactive to light.  Neck:     Musculoskeletal: Normal range of motion and neck supple.     Thyroid: No thyroid mass.     Trachea: No tracheal deviation.  Cardiovascular:     Rate and Rhythm: Normal rate and regular rhythm.     Heart sounds: Normal heart sounds. No murmur. No gallop.   Pulmonary:     Effort: Pulmonary effort is normal. No respiratory distress.     Breath sounds: Normal breath sounds. No stridor. No decreased breath sounds, wheezing, rhonchi or rales.  Abdominal:     General: Bowel sounds are normal. There is no distension.     Palpations: Abdomen is soft.     Tenderness: There is no abdominal tenderness. There is no rebound.  Musculoskeletal: Normal range of motion.        General: No tenderness.  Skin:    General: Skin is warm  and dry.     Findings: No abrasion or rash.  Neurological:     Mental Status: She is alert and oriented to person, place, and time.     GCS: GCS eye subscore is 4. GCS verbal subscore is 5. GCS motor subscore is 6.     Cranial Nerves: No  cranial nerve deficit.     Sensory: No sensory deficit.     Motor: No weakness or tremor.     Comments: Strength is 5 of 5 in all 4 extremities.  Psychiatric:        Speech: Speech normal.        Behavior: Behavior normal.      ED Treatments / Results  Labs (all labs ordered are listed, but only abnormal results are displayed) Labs Reviewed  URINALYSIS, ROUTINE W REFLEX MICROSCOPIC - Abnormal; Notable for the following components:      Result Value   Glucose, UA >=500 (*)    Ketones, ur 5 (*)    All other components within normal limits  BASIC METABOLIC PANEL - Abnormal; Notable for the following components:   Glucose, Bld 319 (*)    Creatinine, Ser 1.06 (*)    GFR calc non Af Amer 56 (*)    All other components within normal limits  CBC - Abnormal; Notable for the following components:   MCV 101.2 (*)    All other components within normal limits  URINE CULTURE    EKG None  Radiology No results found.  Procedures Procedures (including critical care time)  Medications Ordered in ED Medications  0.9 %  sodium chloride infusion (has no administration in time range)  sodium chloride 0.9 % bolus 1,000 mL (has no administration in time range)  metoCLOPramide (REGLAN) injection 5 mg (has no administration in time range)     Initial Impression / Assessment and Plan / ED Course  I have reviewed the triage vital signs and the nursing notes.  Pertinent labs & imaging results that were available during my care of the patient were reviewed by me and considered in my medical decision making (see chart for details).        Patient's head CT and urinalysis without acute findings.  Mild hyperglycemia noted.  No evidence of DKA.  Patient  without headache at this time.  No abdominal flank pain.  Stable for discharge  Final Clinical Impressions(s) / ED Diagnoses   Final diagnoses:  None    ED Discharge Orders    None       Lacretia Leigh, MD 04/11/19 1155

## 2019-04-11 NOTE — ED Triage Notes (Signed)
Pt reports 4 days of not feeling well with flank pain on Lt and Rt groin pain . Pt also reports nausea ,vomiting ,HA , sore throat and bumps on back of throat. Pt had a strep test at PCP that was neg . Pt also had a COVID Test that was neg at work 03-16-19.

## 2019-04-11 NOTE — ED Triage Notes (Signed)
Pt arrives from home with c/o of headache, left flank pain and sore throat. Pt was seen at PCP yesterday for migraine and also tested for strep. Per patient strep was negative.

## 2019-04-12 LAB — URINE CULTURE: Culture: NO GROWTH

## 2019-04-18 DIAGNOSIS — E1165 Type 2 diabetes mellitus with hyperglycemia: Secondary | ICD-10-CM | POA: Diagnosis not present

## 2019-04-20 DIAGNOSIS — Z20828 Contact with and (suspected) exposure to other viral communicable diseases: Secondary | ICD-10-CM | POA: Diagnosis not present

## 2019-04-20 DIAGNOSIS — Z7189 Other specified counseling: Secondary | ICD-10-CM | POA: Diagnosis not present

## 2019-04-25 DIAGNOSIS — C50912 Malignant neoplasm of unspecified site of left female breast: Secondary | ICD-10-CM | POA: Diagnosis not present

## 2019-05-02 DIAGNOSIS — B351 Tinea unguium: Secondary | ICD-10-CM | POA: Diagnosis not present

## 2019-05-02 DIAGNOSIS — L84 Corns and callosities: Secondary | ICD-10-CM | POA: Diagnosis not present

## 2019-05-02 DIAGNOSIS — E1351 Other specified diabetes mellitus with diabetic peripheral angiopathy without gangrene: Secondary | ICD-10-CM | POA: Diagnosis not present

## 2019-05-03 DIAGNOSIS — Z23 Encounter for immunization: Secondary | ICD-10-CM | POA: Diagnosis not present

## 2019-05-07 DIAGNOSIS — H401133 Primary open-angle glaucoma, bilateral, severe stage: Secondary | ICD-10-CM | POA: Diagnosis not present

## 2019-05-10 ENCOUNTER — Other Ambulatory Visit: Payer: Self-pay | Admitting: Surgery

## 2019-05-10 DIAGNOSIS — Z9889 Other specified postprocedural states: Secondary | ICD-10-CM

## 2019-05-24 DIAGNOSIS — Z9641 Presence of insulin pump (external) (internal): Secondary | ICD-10-CM | POA: Diagnosis not present

## 2019-05-24 DIAGNOSIS — E1165 Type 2 diabetes mellitus with hyperglycemia: Secondary | ICD-10-CM | POA: Diagnosis not present

## 2019-05-24 DIAGNOSIS — E1140 Type 2 diabetes mellitus with diabetic neuropathy, unspecified: Secondary | ICD-10-CM | POA: Diagnosis not present

## 2019-05-24 DIAGNOSIS — Z794 Long term (current) use of insulin: Secondary | ICD-10-CM | POA: Diagnosis not present

## 2019-05-30 DIAGNOSIS — E1165 Type 2 diabetes mellitus with hyperglycemia: Secondary | ICD-10-CM | POA: Diagnosis not present

## 2019-05-30 DIAGNOSIS — Z794 Long term (current) use of insulin: Secondary | ICD-10-CM | POA: Diagnosis not present

## 2019-05-30 DIAGNOSIS — Z9641 Presence of insulin pump (external) (internal): Secondary | ICD-10-CM | POA: Diagnosis not present

## 2019-05-30 DIAGNOSIS — E1140 Type 2 diabetes mellitus with diabetic neuropathy, unspecified: Secondary | ICD-10-CM | POA: Diagnosis not present

## 2019-06-05 ENCOUNTER — Encounter: Payer: Self-pay | Admitting: *Deleted

## 2019-06-06 ENCOUNTER — Encounter: Payer: Self-pay | Admitting: Diagnostic Neuroimaging

## 2019-06-06 ENCOUNTER — Other Ambulatory Visit: Payer: Self-pay

## 2019-06-06 ENCOUNTER — Ambulatory Visit: Payer: BC Managed Care – PPO | Admitting: Diagnostic Neuroimaging

## 2019-06-06 VITALS — BP 117/65 | HR 60 | Temp 98.0°F | Ht 62.0 in | Wt 111.4 lb

## 2019-06-06 DIAGNOSIS — G43009 Migraine without aura, not intractable, without status migrainosus: Secondary | ICD-10-CM

## 2019-06-06 MED ORDER — PROMETHAZINE HCL 25 MG PO TABS: 25.0000 mg | ORAL_TABLET | Freq: Two times a day (BID) | ORAL | 3 refills | Status: DC | PRN

## 2019-06-06 MED ORDER — TOPIRAMATE 50 MG PO TABS: 50.0000 mg | ORAL_TABLET | Freq: Two times a day (BID) | ORAL | 12 refills | Status: DC

## 2019-06-06 MED ORDER — PROMETHAZINE HCL 25 MG PO TABS: 25.0000 mg | ORAL_TABLET | Freq: Four times a day (QID) | ORAL | 3 refills | Status: DC | PRN

## 2019-06-06 MED ORDER — RIZATRIPTAN BENZOATE 10 MG PO TBDP: 10.0000 mg | ORAL_TABLET | ORAL | 11 refills | Status: DC | PRN

## 2019-06-06 NOTE — Patient Instructions (Addendum)
-   MIGRAINE PREVENTION  --> start topiramate 50mg  at bedtime; after 1 week increase to twice a day; drink plenty of water  - MIGRAINE RESCUE  --> rizatriptan 10mg  as needed for breakthrough headache; may repeat x 1 after 2 hours; max 2 tabs per day or 8 per month --> phenergan 25mg  as needed for nausea (max 2 tabs per day)

## 2019-06-06 NOTE — Progress Notes (Signed)
[facility]  PATIENT: Breanna Pratt DOB: 1956-05-11  REFERRING CLINICIAN: Merita Norton HISTORY FROM: patient  REASON FOR VISIT: new consult    HISTORICAL  CHIEF COMPLAINT:  Chief Complaint  Patient presents with   Migraine    rm 7 New Pt, last seen [id number], "at certain times my head hurts so bad I get nauseated and weak, may come from sinus?, OTC may work"    HISTORY OF PRESENT ILLNESS:   UPDATE (06/06/19, VRP): 63 year old female here for evaluation of headaches.  Patient has had headaches since age 55 years old.  They were more severe when she was younger.  Now headaches have slightly changed.  Since last visit, doing about the same and averaging 4-5 major headaches per year.  She also has lower level daily headaches.  She is intermediate level headaches a few times a week.  She describes frontal throbbing sensation with nausea and dizziness.  Headaches tend to affect her right side more than the left.  Symptoms start with right ear pain.  No sensitive to light or sound.  Headaches can last 2 to 3 hours at a time.  No specific triggering factors.  At last visit patient tried topiramate and rizatriptan but does not remember if they helped.    PRIOR HPI (03/01/14, VRP): 63 year old right-handed female with diabetes, here for evaluation of headaches. For past 3 years patient has had frontal headaches, occipital headaches, right-sided headaches, right ear pain, TMJ pain in the right side, associated with sinus pressure, allergies, nausea, photophobia and phonophobia. No warning prior to onset of headache. Patient tried tramadol exertion without relief. Patient having at least 2 severe days of headache per month and 10 mild days of headaches per month. Patient had similar headaches since her teenage years with pressure and throbbing sensation. Sometimes she is crawling sensation on her skin. Weather change can be a trigger. Patient never officially diagnosed with migraine headaches.  Patient's mother has migraine headaches. Patient's mother also has a "brain tumor", possibly meningioma status post resection.   REVIEW OF SYSTEMS: Full 14 system review of systems performed and negative with exception of: Headache allergies runny nose weight loss.  ALLERGIES: Allergies  Allergen Reactions   Altace [Ramipril] Other (See Comments)    angioedema   Mobic [Meloxicam] Other (See Comments)    ineffective    HOME MEDICATIONS: Outpatient Medications Prior to Visit  Medication Sig Dispense Refill   anastrozole (ARIMIDEX) 1 MG tablet Take 1 tablet (1 mg total) by mouth daily. 90 tablet 3   gabapentin (NEURONTIN) 300 MG capsule Take 300 mg by mouth 2 (two) times daily.      insulin lispro (HUMALOG) 100 UNIT/ML injection Inject 10-15 Units into the skin 3 (three) times daily before meals.      latanoprost (XALATAN) 0.005 % ophthalmic solution Place 1 drop into both eyes at bedtime.     loratadine (CLARITIN) 10 MG tablet Take 10 mg by mouth daily.     sitaGLIPtan-metformin (JANUMET) 50-1000 MG per tablet Take 1 tablet by mouth daily.      BD PEN NEEDLE NANO U/F 32G X 4 MM MISC      ONE TOUCH ULTRA TEST test strip      diclofenac sodium (VOLTAREN) 1 % GEL Apply 1 application topically 2 (two) times daily.     NOVOLOG FLEXPEN 100 UNIT/ML FlexPen      TRESIBA FLEXTOUCH 100 UNIT/ML SOPN FlexTouch Pen Inject 15 Units as directed at bedtime.  No facility-administered medications prior to visit.     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   Breast cancer ([place]) 05/2017   stg 1, radiation therapy   Chronic back pain    Diabetes mellitus    takes Metformin daily;takes Humalog takes  8in am .12u at lunch and 15 at dinner.Lantus 35units at bedtime;;Average fasting sugars 100-120   Eczema    GERD (gastroesophageal reflux disease)    Prevacid prn   Glaucoma    Hemorrhoids    Herpes    Migraine     Osteopenia    Peripheral neuropathy    takes Gabapentin   Personal history of radiation therapy     PAST SURGICAL HISTORY: Past Surgical History:  Procedure Laterality Date   ABDOMINAL HYSTERECTOMY  10+yrs ago   BREAST LUMPECTOMY WITH RADIOACTIVE SEED AND SENTINEL LYMPH NODE BIOPSY Left 04/12/2017   Procedure: LEFT BREAST LUMPECTOMY WITH RADIOACTIVE SEED AND SENTINEL LYMPH NODE BIOPSY WITH BLUE DYE INJECTION;  Surgeon: Donnie Mesa, MD;  Location: [facility];  Service: General;  Laterality: Left;   COLONOSCOPY  2010   CYSTOSCOPY     as a teenager   DILATION AND CURETTAGE OF UTERUS  30+yrs ago   x 2   SHOULDER ARTHROSCOPY WITH ROTATOR CUFF REPAIR Bilateral    TRIGGER FINGER RELEASE  03/2019   TUBAL LIGATION      FAMILY HISTORY: Family History  Problem Relation Age of Onset   Other Mother        brain tumor, ovarian cancer, blood, bone cancer   Other Father        gsw complications   Diabetes Father    Other Maternal Grandfather        brain tumor   Anesthesia problems Neg Hx    Hypotension Neg Hx    Malignant hyperthermia Neg Hx    Pseudochol deficiency Neg Hx    Breast cancer Neg Hx     SOCIAL HISTORY: Social History   Socioeconomic History   Marital status: Single    Spouse name: Not on file   Number of children: 1   Years of education: Not on file   Highest education level: Associate degree: occupational, Hotel manager, or vocational program  Occupational History   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: [employer]    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, 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: Not on file   Food insecurity    Worry: Not on file    Inability: Not on file   Transportation needs    Medical: Not on file    Non-medical: Not on file  Tobacco Use   Smoking status: Never Smoker   Smokeless tobacco: Never Used  Substance and Sexual Activity   Alcohol use: No   Drug use: No   Sexual activity: Never      Birth control/protection: Surgical  Lifestyle   Physical activity    Days per week: Not on file    Minutes per session: Not on file   Stress: Not on file  Relationships   Social connections    Talks on phone: Not on file    Gets together: Not on file    Attends religious service: Not on file    Active member of club or organization: Not on file    Attends meetings of clubs or organizations: Not on file    Relationship status: Not on file  Intimate partner violence    Fear of current or ex partner: Not on file    Emotionally abused: Not on file    Physically abused: Not on file    Forced sexual activity: Not on file  Other Topics Concern   Not on file  Social History Narrative   Patient lives at home with family.   Caffeine coffee 2-3 c daily     PHYSICAL EXAM  GENERAL EXAM/CONSTITUTIONAL: Vitals:  Vitals:   06/06/19 0819  BP: 117/65  Pulse: 60  Temp: 98 F (36.7 C)  Weight: 111 lb 6.4 oz (50.5 kg)  Height: 5\' 2"  (1.575 m)     Body mass index is 20.38 kg/m. Wt Readings from Last 3 Encounters:  06/06/19 111 lb 6.4 oz (50.5 kg)  10/26/17 121 lb 9.6 oz (55.2 kg)  10/11/17 127 lb 11.2 oz (57.9 kg)     Patient is in no distress; well developed, nourished and groomed; neck is supple  CARDIOVASCULAR:  Examination of carotid arteries is normal; no carotid bruits  Regular rate and rhythm, no murmurs  Examination of peripheral vascular system by observation and palpation is normal  EYES:  Ophthalmoscopic exam of optic discs and posterior segments is normal; no papilledema or hemorrhages  No exam data present  MUSCULOSKELETAL:  Gait, strength, tone, movements noted in Neurologic exam below  NEUROLOGIC: MENTAL STATUS:  No flowsheet data found.  awake, alert, oriented to person, place and time  recent and remote memory intact  normal attention and concentration  language fluent, comprehension intact, naming intact  fund of knowledge  appropriate  CRANIAL NERVE:   2nd - no papilledema on fundoscopic exam  2nd, 3rd, 4th, 6th - pupils equal and reactive to light, visual fields full to confrontation, extraocular muscles intact, no nystagmus  5th - facial sensation symmetric  7th - facial strength symmetric  8th - hearing intact  9th - palate elevates symmetrically, uvula midline  11th - shoulder shrug symmetric  12th - tongue protrusion midline  MOTOR:   normal bulk and tone, full strength in the BUE, BLE  SENSORY:   normal and symmetric to light touch, temperature, vibration  COORDINATION:   finger-nose-finger, fine finger movements normal  REFLEXES:   deep tendon reflexes present and symmetric  GAIT/STATION:   narrow based gait     DIAGNOSTIC DATA (LABS, IMAGING, TESTING) - I reviewed patient records, labs, notes, testing and imaging myself where available.  Lab Results  Component Value Date   WBC 7.4 04/11/2019   HGB 13.6 04/11/2019   HCT 40.6 04/11/2019   MCV 101.2 (H) 04/11/2019   PLT 261 04/11/2019      Component Value Date/Time   NA 135 04/11/2019 0847   K 4.5 04/11/2019 0847   CL 102 04/11/2019 0847   CO2 22 04/11/2019 0847   GLUCOSE 319 (H) 04/11/2019 0847   BUN 19 04/11/2019 0847   CREATININE 1.06 (H) 04/11/2019 0847   CALCIUM 10.1 04/11/2019 0847   GFRNONAA 56 (L) 04/11/2019 0847   GFRAA >60 04/11/2019 0847   No results found for: CHOL, HDL, LDLCALC, LDLDIRECT, TRIG, CHOLHDL No results found for: HGBA1C No results found for: VITAMINB12 Lab Results  Component Value Date   TSH 3.396 07/27/2010    12/02/10 MRI brain 1.  Partially empty sella which has been described in the setting of idiopathic intracranial hypertension (pseudotumor cerebri), but by itself has low specificity and no other secondary findings of that entity are identified in this case. 2.  Normal noncontrast MRI appearance of the brain otherwise.  03/01/14 CT  1. Partially empty sella, as noted on  the prior MRI examination from 2012. Otherwise, no significant abnormalities are observed.  04/11/19 CT head [I reviewed images myself and agree with interpretation. -VRP]  - No acute abnormality.    ASSESSMENT AND PLAN  63 y.o. year old female here with migraine without aura since age 48 years old.   Dx:  1. Migraine without aura and without status migrainosus, not intractable      PLAN:  - MIGRAINE PREVENTION  --> start topiramate 50mg  at bedtime; after 1 week increase to twice a day; drink plenty of water  - MIGRAINE RESCUE  --> rizatriptan 10mg  as needed for breakthrough headache; may repeat x 1 after 2 hours; max 2 tabs per day or 8 per month --> phenergan 25mg  as needed for nausea  Meds ordered this encounter  Medications   topiramate (TOPAMAX) 50 MG tablet    Sig: Take 1 tablet (50 mg total) by mouth 2 (two) times daily.    Dispense:  60 tablet    Refill:  12   rizatriptan (MAXALT-MLT) 10 MG disintegrating tablet    Sig: Take 1 tablet (10 mg total) by mouth as needed for migraine. May repeat in 2 hours if needed    Dispense:  9 tablet    Refill:  11   DISCONTD: promethazine (PHENERGAN) 25 MG tablet    Sig: Take 1 tablet (25 mg total) by mouth every 6 (six) hours as needed for nausea or vomiting.    Dispense:  30 tablet    Refill:  3   promethazine (PHENERGAN) 25 MG tablet    Sig: Take 1 tablet (25 mg total) by mouth 2 (two) times daily as needed for nausea or vomiting.    Dispense:  30 tablet    Refill:  3   Return in about 6 months (around 12/05/2019) for with NP (Amy Lomax).    Penni Bombard, MD [id number], [id number] AM Certified in Neurology, Neurophysiology and Neuroimaging  [facility] [address] [place], [place] [address] [phone]

## 2019-06-11 DIAGNOSIS — E1165 Type 2 diabetes mellitus with hyperglycemia: Secondary | ICD-10-CM | POA: Diagnosis not present

## 2019-06-26 DIAGNOSIS — Z03818 Encounter for observation for suspected exposure to other biological agents ruled out: Secondary | ICD-10-CM | POA: Diagnosis not present

## 2019-06-26 DIAGNOSIS — Z7189 Other specified counseling: Secondary | ICD-10-CM | POA: Diagnosis not present

## 2019-06-26 DIAGNOSIS — Z20828 Contact with and (suspected) exposure to other viral communicable diseases: Secondary | ICD-10-CM | POA: Diagnosis not present

## 2019-06-27 ENCOUNTER — Other Ambulatory Visit: Payer: Self-pay

## 2019-06-27 ENCOUNTER — Ambulatory Visit
Admission: RE | Admit: 2019-06-27 | Discharge: 2019-06-27 | Disposition: A | Discharge status: Home / Self Care | Payer: BC Managed Care – PPO | Source: Ambulatory Visit | Attending: Surgery | Admitting: Surgery

## 2019-06-27 DIAGNOSIS — R928 Other abnormal and inconclusive findings on diagnostic imaging of breast: Secondary | ICD-10-CM | POA: Diagnosis not present

## 2019-06-27 DIAGNOSIS — Z20828 Contact with and (suspected) exposure to other viral communicable diseases: Secondary | ICD-10-CM | POA: Diagnosis not present

## 2019-06-27 DIAGNOSIS — Z9889 Other specified postprocedural states: Secondary | ICD-10-CM

## 2019-07-09 DIAGNOSIS — C50912 Malignant neoplasm of unspecified site of left female breast: Secondary | ICD-10-CM | POA: Diagnosis not present

## 2019-07-11 DIAGNOSIS — E1165 Type 2 diabetes mellitus with hyperglycemia: Secondary | ICD-10-CM | POA: Diagnosis not present

## 2019-08-01 DIAGNOSIS — B351 Tinea unguium: Secondary | ICD-10-CM | POA: Diagnosis not present

## 2019-08-01 DIAGNOSIS — S92501A Displaced unspecified fracture of right lesser toe(s), initial encounter for closed fracture: Secondary | ICD-10-CM | POA: Diagnosis not present

## 2019-08-01 DIAGNOSIS — E1351 Other specified diabetes mellitus with diabetic peripheral angiopathy without gangrene: Secondary | ICD-10-CM | POA: Diagnosis not present

## 2019-08-13 DIAGNOSIS — E1165 Type 2 diabetes mellitus with hyperglycemia: Secondary | ICD-10-CM | POA: Diagnosis not present

## 2019-08-22 DIAGNOSIS — S92501D Displaced unspecified fracture of right lesser toe(s), subsequent encounter for fracture with routine healing: Secondary | ICD-10-CM | POA: Diagnosis not present

## 2019-08-22 DIAGNOSIS — E1351 Other specified diabetes mellitus with diabetic peripheral angiopathy without gangrene: Secondary | ICD-10-CM | POA: Diagnosis not present

## 2019-09-02 ENCOUNTER — Other Ambulatory Visit: Payer: Self-pay | Admitting: Physician Assistant

## 2019-09-02 ENCOUNTER — Encounter (HOSPITAL_COMMUNITY): Payer: Self-pay

## 2019-09-02 ENCOUNTER — Ambulatory Visit (INDEPENDENT_AMBULATORY_CARE_PROVIDER_SITE_OTHER): Payer: BC Managed Care – PPO

## 2019-09-02 ENCOUNTER — Other Ambulatory Visit: Payer: Self-pay

## 2019-09-02 ENCOUNTER — Ambulatory Visit (HOSPITAL_COMMUNITY)
Admission: EM | Admit: 2019-09-02 | Discharge: 2019-09-02 | Disposition: A | Discharge status: Home / Self Care | Payer: BC Managed Care – PPO | Attending: Emergency Medicine | Admitting: Emergency Medicine

## 2019-09-02 DIAGNOSIS — U071 COVID-19: Secondary | ICD-10-CM

## 2019-09-02 DIAGNOSIS — R079 Chest pain, unspecified: Secondary | ICD-10-CM | POA: Diagnosis not present

## 2019-09-02 DIAGNOSIS — R0602 Shortness of breath: Secondary | ICD-10-CM | POA: Diagnosis not present

## 2019-09-02 DIAGNOSIS — E119 Type 2 diabetes mellitus without complications: Secondary | ICD-10-CM

## 2019-09-02 MED ORDER — BENZONATATE 200 MG PO CAPS: 200.0000 mg | ORAL_CAPSULE | Freq: Three times a day (TID) | ORAL | 0 refills | Status: AC | PRN

## 2019-09-02 NOTE — ED Provider Notes (Signed)
[place]    CSN: [id number] Arrival date & time: 09/02/19  1349      History   Chief Complaint Chief Complaint  Patient presents with   COVID POSITIVE   Shortness of Breath    HPI Breanna Pratt is a 64 y.o. female history of DM type II, breast cancer status post lumpectomy and radiation presenting today for evaluation of chest pain and shortness of breath in setting of Covid.  Patient tested positive for Covid on 1/22.  Her symptoms began with a headache and some nasal congestion/rhinorrhea.  She has had relatively minimal cough.  Over the past day she has developed increased discomfort in her chest.  Feels a pressure sensation with occasional sharp pains.  Will occasionally radiate to back.  Currently denies any discomfort.  She is also felt as if her breathing is often is unable to take a full deep breath.  Denies prior DVT/PE.  Denies leg pain or leg swelling.  Denies exogenous estrogen.  Denies tobacco use.  Denies history of hypertension.  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   Breast cancer ([place]) 05/2017   stg 1, radiation therapy   Chronic back pain    Diabetes mellitus    takes Metformin daily;takes Humalog takes  8in am .12u at lunch and 15 at dinner.Lantus 35units at bedtime;;Average fasting sugars 100-120   Eczema    GERD (gastroesophageal reflux disease)    Prevacid prn   Glaucoma    Hemorrhoids    Herpes    Migraine    Osteopenia    Peripheral neuropathy    takes Gabapentin   Personal history of radiation therapy     Patient Active Problem List   Diagnosis Date Noted   Carcinoma of upper-outer quadrant of left breast in female, estrogen receptor positive ([place]) 03/18/2017   Palpitations 05/12/2015   Rotator cuff (capsule) sprain 07/23/2011    Past Surgical History:  Procedure Laterality Date   ABDOMINAL HYSTERECTOMY  10+yrs ago   BREAST LUMPECTOMY WITH RADIOACTIVE SEED AND SENTINEL  LYMPH NODE BIOPSY Left 04/12/2017   Procedure: LEFT BREAST LUMPECTOMY WITH RADIOACTIVE SEED AND SENTINEL LYMPH NODE BIOPSY WITH BLUE DYE INJECTION;  Surgeon: Donnie Mesa, MD;  Location: [facility];  Service: General;  Laterality: Left;   COLONOSCOPY  2010   CYSTOSCOPY     as a teenager   DILATION AND CURETTAGE OF UTERUS  30+yrs ago   x 2   SHOULDER ARTHROSCOPY WITH ROTATOR CUFF REPAIR Bilateral    TRIGGER FINGER RELEASE  03/2019   TUBAL LIGATION      OB History   No obstetric history on file.      Home Medications    Prior to Admission medications   Medication Sig Start Date End Date Taking? Authorizing Provider  anastrozole (ARIMIDEX) 1 MG tablet Take 1 tablet (1 mg total) by mouth daily. 12/19/18   Nicholas Lose, MD  BD PEN NEEDLE NANO U/F 32G X 4 MM MISC  01/21/14   [provider]  benzonatate (TESSALON) 200 MG capsule Take 1 capsule (200 mg total) by mouth 3 (three) times daily as needed for up to 7 days for cough. 09/02/19 09/09/19  N[place] Kirchner C, PA-C  gabapentin (NEURONTIN) 300 MG capsule Take 300 mg by mouth 2 (two) times daily.     [provider]  insulin lispro (HUMALOG) 100 UNIT/ML injection Inject 10-15 Units into the skin 3 (  three) times daily before meals.     [provider]  latanoprost (XALATAN) 0.005 % ophthalmic solution Place 1 drop into both eyes at bedtime. 04/13/17   [provider]  loratadine (CLARITIN) 10 MG tablet Take 10 mg by mouth daily.    [provider]  ONE TOUCH ULTRA TEST test strip  02/20/14   [provider]  promethazine (PHENERGAN) 25 MG tablet Take 1 tablet (25 mg total) by mouth 2 (two) times daily as needed for nausea or vomiting. 06/06/19   Penumalli, Earlean Polka, MD  rizatriptan (MAXALT-MLT) 10 MG disintegrating tablet Take 1 tablet (10 mg total) by mouth as needed for migraine. May repeat in 2 hours if needed 06/06/19   Penumalli, Earlean Polka, MD  sitaGLIPtan-metformin  (JANUMET) 50-1000 MG per tablet Take 1 tablet by mouth daily.     [provider]  topiramate (TOPAMAX) 50 MG tablet Take 1 tablet (50 mg total) by mouth 2 (two) times daily. 06/06/19   Penumalli, Earlean Polka, MD  lisinopril (PRINIVIL,ZESTRIL) 2.5 MG tablet Take 2.5 mg by mouth daily.    10/15/11  [provider]  metFORMIN (GLUCOPHAGE) 500 MG tablet Take 500 mg by mouth daily.    10/15/11  [provider]    Family History Family History  Problem Relation Age of Onset   Other Mother        brain tumor, ovarian cancer, blood, bone cancer   Other Father        gsw complications   Diabetes Father    Other Maternal Grandfather        brain tumor   Anesthesia problems Neg Hx    Hypotension Neg Hx    Malignant hyperthermia Neg Hx    Pseudochol deficiency Neg Hx    Breast cancer Neg Hx     Social History Social History   Tobacco Use   Smoking status: Never Smoker   Smokeless tobacco: Never Used  Substance Use Topics   Alcohol use: No   Drug use: No     Allergies   Altace [ramipril] and Mobic [meloxicam]   Review of Systems Review of Systems  Constitutional: Negative for activity change, appetite change, chills, fatigue and fever.  HENT: Positive for congestion and rhinorrhea. Negative for ear pain, sinus pressure, sore throat and trouble swallowing.   Eyes: Negative for discharge and redness.  Respiratory: Positive for shortness of breath. Negative for cough and chest tightness.   Cardiovascular: Positive for chest pain. Negative for leg swelling.  Gastrointestinal: Negative for abdominal pain, diarrhea, nausea and vomiting.  Musculoskeletal: Negative for myalgias.  Skin: Negative for rash.  Neurological: Negative for dizziness, light-headedness and headaches.     Physical Exam Triage Vital Signs ED Triage Vitals [09/02/19 1412]  Enc Vitals Group     BP 118/60     Pulse Rate 62     Resp 17     Temp 98.6 F (37 C)     Temp Source  Oral     SpO2 100 %     Weight      Height      Head Circumference      Peak Flow      Pain Score      Pain Loc      Pain Edu?      Excl. in [place]?    No data found.  Updated Vital Signs BP 118/60 (BP Location: Left Arm)    Pulse 62    Temp 98.6  F (37 C) (Oral)    Resp 17    SpO2 100%   Visual Acuity Right Eye Distance:   Left Eye Distance:   Bilateral Distance:    Right Eye Near:   Left Eye Near:    Bilateral Near:     Physical Exam Vitals and nursing note reviewed.  Constitutional:      General: She is not in acute distress.    Appearance: She is well-developed.  HENT:     Head: Normocephalic and atraumatic.     Ears:     Comments: Bilateral ears without tenderness to palpation of external auricle, tragus and mastoid, EAC's without erythema or swelling, TM's with good bony landmarks and cone of light. Non erythematous.     Mouth/Throat:     Comments: Oral mucosa pink and moist, no tonsillar enlargement or exudate. Posterior pharynx patent and nonerythematous, no uvula deviation or swelling. Normal phonation. Eyes:     Conjunctiva/sclera: Conjunctivae normal.  Cardiovascular:     Rate and Rhythm: Normal rate and regular rhythm.     Heart sounds: No murmur.  Pulmonary:     Effort: Pulmonary effort is normal. No respiratory distress.     Breath sounds: Normal breath sounds.     Comments: Breathing comfortably at rest, CTABL, no wheezing, rales or other adventitious sounds auscultated Abdominal:     Palpations: Abdomen is soft.     Tenderness: There is no abdominal tenderness.  Musculoskeletal:     Cervical back: Neck supple.     Comments: Bilateral lower leg symmetric, no calf swelling or tenderness  Skin:    General: Skin is warm and dry.  Neurological:     Mental Status: She is alert.      UC Treatments / Results  Labs (all labs ordered are listed, but only abnormal results are displayed) Labs Reviewed - No data to display  EKG   Radiology DG Chest  2 View  Result Date: 09/02/2019 CLINICAL DATA:  Shortness of breath, chest pain, COVID-19 positive, history breast cancer EXAM: CHEST - 2 VIEW COMPARISON:  07/12/2011 FINDINGS: Normal heart size, mediastinal contours, and pulmonary vascularity. Lungs clear. No pleural effusion or pneumothorax. Bones demineralized. Surgical clips LEFT breast. IMPRESSION: No acute abnormalities. Electronically Signed   By: Lavonia Dana M.D.   On: 09/02/2019 14:37    Procedures Procedures (including critical care time)  Medications Ordered in UC Medications - No data to display  Initial Impression / Assessment and Plan / UC Course  I have reviewed the triage vital signs and the nursing notes.  Pertinent labs & imaging results that were available during my care of the patient were reviewed by me and considered in my medical decision making (see chart for details).    Referring patient to infusion clinic given history of cancer, diabetes.  Chest x-ray and EKG reassuring today.  Likely normal symptoms of Covid, but will continue to monitor.  Continue to follow-up if symptoms progressing or worsening.  Final Clinical Impressions(s) / UC Diagnoses   Final diagnoses:  COVID-19  SOB (shortness of breath)  Chest pain, unspecified type     Discharge Instructions     Chest Xray normal EKG normal These can be normal symptoms for COVID, but continue to monitor May use tessalon/benzonatate if developing more of a cough  Please follow up if symptoms worsening/changing    ED Prescriptions    Medication Sig Dispense Auth. Provider   benzonatate (TESSALON) 200 MG capsule Take 1 capsule (200 mg  total) by mouth 3 (three) times daily as needed for up to 7 days for cough. 28 capsule Elsie Sakuma, [place] C, PA-C     PDMP not reviewed this encounter.   Janith Lima, PA-C 09/02/19 1521

## 2019-09-02 NOTE — ED Triage Notes (Signed)
Pt tested positive for covid on Friday 08/31/2019 and states she is having shortness of breath and slight chest discomfort.

## 2019-09-02 NOTE — Progress Notes (Signed)
  I connected by phone with Breanna Pratt on 09/02/2019 at 3:00 PM to discuss the potential use of an new treatment for mild to moderate COVID-19 viral infection in non-hospitalized patients.  This patient is a 64 y.o. female that meets the FDA criteria for Emergency Use Authorization of bamlanivimab or casirivimab\imdevimab.  Has a (+) direct SARS-CoV-2 viral test result  Has mild or moderate COVID-19   Is ? 64 years of age and weighs ? 40 kg  Is NOT hospitalized due to COVID-19  Is NOT requiring oxygen therapy or requiring an increase in baseline oxygen flow rate due to COVID-19  Is within 10 days of symptom onset  Has at least one of the high risk factor(s) for progression to severe COVID-19 and/or hospitalization as defined in EUA.  Specific high risk criteria : Diabetes   I have spoken and communicated the following to the patient or parent/caregiver:  1. FDA has authorized the emergency use of bamlanivimab and casirivimab\imdevimab for the treatment of mild to moderate COVID-19 in adults and pediatric patients with positive results of direct SARS-CoV-2 viral testing who are 25 years of age and older weighing at least 40 kg, and who are at high risk for progressing to severe COVID-19 and/or hospitalization.  2. The significant known and potential risks and benefits of bamlanivimab and casirivimab\imdevimab, and the extent to which such potential risks and benefits are unknown.  3. Information on available alternative treatments and the risks and benefits of those alternatives, including clinical trials.  4. Patients treated with bamlanivimab and casirivimab\imdevimab should continue to self-isolate and use infection control measures (e.g., wear mask, isolate, social distance, avoid sharing personal items, clean and disinfect "high touch" surfaces, and frequent handwashing) according to CDC guidelines.   5. The patient or parent/caregiver has the option to accept or refuse  bamlanivimab or casirivimab\imdevimab .  After reviewing this information with the patient, The patient agreed to proceed with receiving the bamlanimivab infusion and will be provided a copy of the Fact sheet prior to receiving the infusion.   Pt set up for 09/06/19 @ 8:30am. She will bring a copy of her positive test from [facility].  [name] Form 09/02/2019 3:00 PM

## 2019-09-02 NOTE — Discharge Instructions (Addendum)
Chest Xray normal EKG normal These can be normal symptoms for COVID, but continue to monitor May use tessalon/benzonatate if developing more of a cough  Please follow up if symptoms worsening/changing

## 2019-09-06 ENCOUNTER — Ambulatory Visit (HOSPITAL_COMMUNITY): Payer: BC Managed Care – PPO

## 2019-09-06 ENCOUNTER — Ambulatory Visit (HOSPITAL_COMMUNITY)
Admission: RE | Admit: 2019-09-06 | Discharge: 2019-09-06 | Disposition: A | Discharge status: Home / Self Care | Payer: BC Managed Care – PPO | Source: Ambulatory Visit | Attending: Pulmonary Disease | Admitting: Pulmonary Disease

## 2019-09-06 DIAGNOSIS — U071 COVID-19: Secondary | ICD-10-CM | POA: Diagnosis not present

## 2019-09-06 DIAGNOSIS — E119 Type 2 diabetes mellitus without complications: Secondary | ICD-10-CM | POA: Insufficient documentation

## 2019-09-06 MED ORDER — FAMOTIDINE IN NACL 20-0.9 MG/50ML-% IV SOLN: 20.0000 mg | Freq: Once | INTRAVENOUS | Status: DC | PRN

## 2019-09-06 MED ORDER — ACETAMINOPHEN 325 MG PO TABS
650.0000 mg | ORAL_TABLET | Freq: Four times a day (QID) | ORAL | Status: AC | PRN
  Administered 2019-09-06: 650 mg via ORAL

## 2019-09-06 MED ORDER — ALBUTEROL SULFATE HFA 108 (90 BASE) MCG/ACT IN AERS: 2.0000 | INHALATION_SPRAY | Freq: Once | RESPIRATORY_TRACT | Status: DC | PRN

## 2019-09-06 MED ORDER — SODIUM CHLORIDE 0.9 % IV SOLN: INTRAVENOUS | Status: DC | PRN

## 2019-09-06 MED ORDER — METHYLPREDNISOLONE SODIUM SUCC 125 MG IJ SOLR: 125.0000 mg | Freq: Once | INTRAMUSCULAR | Status: DC | PRN

## 2019-09-06 MED ORDER — SODIUM CHLORIDE 0.9 % IV SOLN
700.0000 mg | Freq: Once | INTRAVENOUS | Status: AC
  Administered 2019-09-06: 15:00:00 700 mg via INTRAVENOUS
  Filled 2019-09-06: qty 20

## 2019-09-06 MED ORDER — DIPHENHYDRAMINE HCL 50 MG/ML IJ SOLN: 50.0000 mg | Freq: Once | INTRAMUSCULAR | Status: DC | PRN

## 2019-09-06 MED ORDER — ACETAMINOPHEN 325 MG PO TABS
ORAL_TABLET | ORAL | Status: AC
  Filled 2019-09-06: qty 1

## 2019-09-06 MED ORDER — EPINEPHRINE 0.3 MG/0.3ML IJ SOAJ: 0.3000 mg | Freq: Once | INTRAMUSCULAR | Status: DC | PRN

## 2019-09-06 NOTE — Discharge Instructions (Signed)
COVID-19 COVID-19 is a respiratory infection that is caused by a virus called severe acute respiratory syndrome coronavirus 2 (SARS-CoV-2). The disease is also known as coronavirus disease or novel coronavirus. In some people, the virus may not cause any symptoms. In others, it may cause a serious infection. The infection can get worse quickly and can lead to complications, such as:  Pneumonia, or infection of the lungs.  Acute respiratory distress syndrome or ARDS. This is a condition in which fluid build-up in the lungs prevents the lungs from filling with air and passing oxygen into the blood.  Acute respiratory failure. This is a condition in which there is not enough oxygen passing from the lungs to the body or when carbon dioxide is not passing from the lungs out of the body.  Sepsis or septic shock. This is a serious bodily reaction to an infection.  Blood clotting problems.  Secondary infections due to bacteria or fungus.  Organ failure. This is when your body's organs stop working. The virus that causes COVID-19 is contagious. This means that it can spread from person to person through droplets from coughs and sneezes (respiratory secretions). What are the causes? This illness is caused by a virus. You may catch the virus by:  Breathing in droplets from an infected person. Droplets can be spread by a person breathing, speaking, singing, coughing, or sneezing.  Touching something, like a table or a doorknob, that was exposed to the virus (contaminated) and then touching your mouth, nose, or eyes. What increases the risk? Risk for infection You are more likely to be infected with this virus if you:  Are within 6 feet (2 meters) of a person with COVID-19.  Provide care for or live with a person who is infected with COVID-19.  Spend time in crowded indoor spaces or live in shared housing. Risk for serious illness You are more likely to become seriously ill from the virus if  you:  Are 50 years of age or older. The higher your age, the more you are at risk for serious illness.  Live in a nursing home or long-term care facility.  Have cancer.  Have a long-term (chronic) disease such as: ? Chronic lung disease, including chronic obstructive pulmonary disease or asthma. ? A long-term disease that lowers your body's ability to fight infection (immunocompromised). ? Heart disease, including heart failure, a condition in which the arteries that lead to the heart become narrow or blocked (coronary artery disease), a disease which makes the heart muscle thick, weak, or stiff (cardiomyopathy). ? Diabetes. ? Chronic kidney disease. ? Sickle cell disease, a condition in which red blood cells have an abnormal "sickle" shape. ? Liver disease.  Are obese. What are the signs or symptoms? Symptoms of this condition can range from mild to severe. Symptoms may appear any time from 2 to 14 days after being exposed to the virus. They include:  A fever or chills.  A cough.  Difficulty breathing.  Headaches, body aches, or muscle aches.  Runny or stuffy (congested) nose.  A sore throat.  New loss of taste or smell. Some people may also have stomach problems, such as nausea, vomiting, or diarrhea. Other people may not have any symptoms of COVID-19. How is this diagnosed? This condition may be diagnosed based on:  Your signs and symptoms, especially if: ? You live in an area with a COVID-19 outbreak. ? You recently traveled to or from an area where the virus is common. ? You   provide care for or live with a person who was diagnosed with COVID-19. ? You were exposed to a person who was diagnosed with COVID-19.  A physical exam.  Lab tests, which may include: ? Taking a sample of fluid from the back of your nose and throat (nasopharyngeal fluid), your nose, or your throat using a swab. ? A sample of mucus from your lungs (sputum). ? Blood tests.  Imaging tests,  which may include, X-rays, CT scan, or ultrasound. How is this treated? At present, there is no medicine to treat COVID-19. Medicines that treat other diseases are being used on a trial basis to see if they are effective against COVID-19. Your health care provider will talk with you about ways to treat your symptoms. For most people, the infection is mild and can be managed at home with rest, fluids, and over-the-counter medicines. Treatment for a serious infection usually takes places in a hospital intensive care unit (ICU). It may include one or more of the following treatments. These treatments are given until your symptoms improve.  Receiving fluids and medicines through an IV.  Supplemental oxygen. Extra oxygen is given through a tube in the nose, a face mask, or a hood.  Positioning you to lie on your stomach (prone position). This makes it easier for oxygen to get into the lungs.  Continuous positive airway pressure (CPAP) or bi-level positive airway pressure (BPAP) machine. This treatment uses mild air pressure to keep the airways open. A tube that is connected to a motor delivers oxygen to the body.  Ventilator. This treatment moves air into and out of the lungs by using a tube that is placed in your windpipe.  Tracheostomy. This is a procedure to create a hole in the neck so that a breathing tube can be inserted.  Extracorporeal membrane oxygenation (ECMO). This procedure gives the lungs a chance to recover by taking over the functions of the heart and lungs. It supplies oxygen to the body and removes carbon dioxide. Follow these instructions at home: Lifestyle  If you are sick, stay home except to get medical care. Your health care provider will tell you how long to stay home. Call your health care provider before you go for medical care.  Rest at home as told by your health care provider.  Do not use any products that contain nicotine or tobacco, such as cigarettes,  e-cigarettes, and chewing tobacco. If you need help quitting, ask your health care provider.  Return to your normal activities as told by your health care provider. Ask your health care provider what activities are safe for you. General instructions  Take over-the-counter and prescription medicines only as told by your health care provider.  Drink enough fluid to keep your urine pale yellow.  Keep all follow-up visits as told by your health care provider. This is important. How is this prevented?  There is no vaccine to help prevent COVID-19 infection. However, there are steps you can take to protect yourself and others from this virus. To protect yourself:   Do not travel to areas where COVID-19 is a risk. The areas where COVID-19 is reported change often. To identify high-risk areas and travel restrictions, check the CDC travel website: wwwnc.cdc.gov/travel/notices  If you live in, or must travel to, an area where COVID-19 is a risk, take precautions to avoid infection. ? Stay away from people who are sick. ? Wash your hands often with soap and water for 20 seconds. If soap and water   are not available, use an alcohol-based hand sanitizer. ? Avoid touching your mouth, face, eyes, or nose. ? Avoid going out in public, follow guidance from your state and local health authorities. ? If you must go out in public, wear a cloth face covering or face mask. Make sure your mask covers your nose and mouth. ? Avoid crowded indoor spaces. Stay at least 6 feet (2 meters) away from others. ? Disinfect objects and surfaces that are frequently touched every day. This may include:  Counters and tables.  Doorknobs and light switches.  Sinks and faucets.  Electronics, such as phones, remote controls, keyboards, computers, and tablets. To protect others: If you have symptoms of COVID-19, take steps to prevent the virus from spreading to others.  If you think you have a COVID-19 infection, contact  your health care provider right away. Tell your health care team that you think you may have a COVID-19 infection.  Stay home. Leave your house only to seek medical care. Do not use public transport.  Do not travel while you are sick.  Wash your hands often with soap and water for 20 seconds. If soap and water are not available, use alcohol-based hand sanitizer.  Stay away from other members of your household. Let healthy household members care for children and pets, if possible. If you have to care for children or pets, wash your hands often and wear a mask. If possible, stay in your own room, separate from others. Use a different bathroom.  Make sure that all people in your household wash their hands well and often.  Cough or sneeze into a tissue or your sleeve or elbow. Do not cough or sneeze into your hand or into the air.  Wear a cloth face covering or face mask. Make sure your mask covers your nose and mouth. Where to find more information  Centers for Disease Control and Prevention: www.cdc.gov/coronavirus/2019-ncov/index.html  World Health Organization: www.who.int/health-topics/coronavirus Contact a health care provider if:  You live in or have traveled to an area where COVID-19 is a risk and you have symptoms of the infection.  You have had contact with someone who has COVID-19 and you have symptoms of the infection. Get help right away if:  You have trouble breathing.  You have pain or pressure in your chest.  You have confusion.  You have bluish lips and fingernails.  You have difficulty waking from sleep.  You have symptoms that get worse. These symptoms may represent a serious problem that is an emergency. Do not wait to see if the symptoms will go away. Get medical help right away. Call your local emergency services (911 in the [place] Do not drive yourself to the hospital. Let the emergency medical personnel know if you think you have  COVID-19. Summary  COVID-19 is a respiratory infection that is caused by a virus. It is also known as coronavirus disease or novel coronavirus. It can cause serious infections, such as pneumonia, acute respiratory distress syndrome, acute respiratory failure, or sepsis.  The virus that causes COVID-19 is contagious. This means that it can spread from person to person through droplets from breathing, speaking, singing, coughing, or sneezing.  You are more likely to develop a serious illness if you are 50 years of age or older, have a weak immune system, live in a nursing home, or have chronic disease.  There is no medicine to treat COVID-19. Your health care provider will talk with you about ways to treat your symptoms.    Take steps to protect yourself and others from infection. Wash your hands often and disinfect objects and surfaces that are frequently touched every day. Stay away from people who are sick and wear a mask if you are sick. This information is not intended to replace advice given to you by your health care provider. Make sure you discuss any questions you have with your health care provider. Document Revised: 05/25/2019 Document Reviewed: 08/31/2018 Elsevier Patient Education  2020 Elsevier Inc. What types of side effects do monoclonal antibody drugs cause?  Common side effects  In general, the more common side effects caused by monoclonal antibody drugs include: . Allergic reactions, such as hives or itching . Flu-like signs and symptoms, including chills, fatigue, fever, and muscle aches and pains . Nausea, vomiting . Diarrhea . Skin rashes . Low blood pressure   The CDC is recommending patients who receive monoclonal antibody treatments wait at least 90 days before being vaccinated.  Currently, there are no data on the safety and efficacy of mRNA COVID-19 vaccines in persons who received monoclonal antibodies or convalescent plasma as part of COVID-19 treatment. Based  on the estimated half-life of such therapies as well as evidence suggesting that reinfection is uncommon in the 90 days after initial infection, vaccination should be deferred for at least 90 days, as a precautionary measure until additional information becomes available, to avoid interference of the antibody treatment with vaccine-induced immune responses. 

## 2019-09-06 NOTE — Progress Notes (Signed)
  Diagnosis: COVID-19  Physician: Patrick Wright  Procedure: [facility] Med: bamlanivimab infusion - Provided patient with bamlanimivab fact sheet for patients, parents and caregivers prior to infusion.  Complications: No immediate complications noted.  Discharge: Discharged home   Rachelann Enloe C 09/06/2019   

## 2019-09-10 DIAGNOSIS — Z03818 Encounter for observation for suspected exposure to other biological agents ruled out: Secondary | ICD-10-CM | POA: Diagnosis not present

## 2019-09-13 DIAGNOSIS — E1165 Type 2 diabetes mellitus with hyperglycemia: Secondary | ICD-10-CM | POA: Diagnosis not present

## 2019-09-18 ENCOUNTER — Other Ambulatory Visit: Payer: Self-pay

## 2019-09-18 ENCOUNTER — Emergency Department (HOSPITAL_COMMUNITY)
Admission: EM | Admit: 2019-09-18 | Discharge: 2019-09-18 | Disposition: A | Discharge status: Home / Self Care | Payer: BC Managed Care – PPO | Attending: Emergency Medicine | Admitting: Emergency Medicine

## 2019-09-18 ENCOUNTER — Emergency Department (HOSPITAL_COMMUNITY): Payer: BC Managed Care – PPO

## 2019-09-18 DIAGNOSIS — E119 Type 2 diabetes mellitus without complications: Secondary | ICD-10-CM | POA: Diagnosis not present

## 2019-09-18 DIAGNOSIS — R000 Tachycardia, unspecified: Secondary | ICD-10-CM | POA: Diagnosis not present

## 2019-09-18 DIAGNOSIS — R0600 Dyspnea, unspecified: Secondary | ICD-10-CM | POA: Diagnosis not present

## 2019-09-18 DIAGNOSIS — R0602 Shortness of breath: Secondary | ICD-10-CM

## 2019-09-18 DIAGNOSIS — U071 COVID-19: Secondary | ICD-10-CM | POA: Diagnosis not present

## 2019-09-18 DIAGNOSIS — Z79899 Other long term (current) drug therapy: Secondary | ICD-10-CM | POA: Insufficient documentation

## 2019-09-18 DIAGNOSIS — Z794 Long term (current) use of insulin: Secondary | ICD-10-CM | POA: Insufficient documentation

## 2019-09-18 DIAGNOSIS — R0789 Other chest pain: Secondary | ICD-10-CM | POA: Diagnosis not present

## 2019-09-18 DIAGNOSIS — R079 Chest pain, unspecified: Secondary | ICD-10-CM | POA: Diagnosis not present

## 2019-09-18 LAB — CBC WITH DIFFERENTIAL/PLATELET
Abs Immature Granulocytes: 0.06 10*3/uL (ref 0.00–0.07)
Basophils Absolute: 0.1 10*3/uL (ref 0.0–0.1)
Basophils Relative: 1 %
Eosinophils Absolute: 0 10*3/uL (ref 0.0–0.5)
Eosinophils Relative: 0 %
HCT: 37 % (ref 36.0–46.0)
Hemoglobin: 12.4 g/dL (ref 12.0–15.0)
Immature Granulocytes: 1 %
Lymphocytes Relative: 11 %
Lymphs Abs: 1.4 10*3/uL (ref 0.7–4.0)
MCH: 34.6 pg — ABNORMAL HIGH (ref 26.0–34.0)
MCHC: 33.5 g/dL (ref 30.0–36.0)
MCV: 103.4 fL — ABNORMAL HIGH (ref 80.0–100.0)
Monocytes Absolute: 1 10*3/uL (ref 0.1–1.0)
Monocytes Relative: 8 %
Neutro Abs: 10.2 10*3/uL — ABNORMAL HIGH (ref 1.7–7.7)
Neutrophils Relative %: 79 %
Platelets: 241 10*3/uL (ref 150–400)
RBC: 3.58 MIL/uL — ABNORMAL LOW (ref 3.87–5.11)
RDW: 13.3 % (ref 11.5–15.5)
WBC: 12.7 10*3/uL — ABNORMAL HIGH (ref 4.0–10.5)
nRBC: 0 % (ref 0.0–0.2)

## 2019-09-18 LAB — TROPONIN I (HIGH SENSITIVITY)
Troponin I (High Sensitivity): 4 ng/L (ref <18)
Troponin I (High Sensitivity): 4 ng/L (ref <18)

## 2019-09-18 LAB — COMPREHENSIVE METABOLIC PANEL
ALT: 34 U/L (ref 0–44)
AST: 26 U/L (ref 15–41)
Albumin: 4.4 g/dL (ref 3.5–5.0)
Alkaline Phosphatase: 101 U/L (ref 38–126)
Anion gap: 14 (ref 5–15)
BUN: 23 mg/dL (ref 8–23)
CO2: 21 mmol/L — ABNORMAL LOW (ref 22–32)
Calcium: 9.3 mg/dL (ref 8.9–10.3)
Chloride: 102 mmol/L (ref 98–111)
Creatinine, Ser: 1.11 mg/dL — ABNORMAL HIGH (ref 0.44–1.00)
GFR calc Af Amer: >60 mL/min (ref >60)
GFR calc non Af Amer: 53 mL/min — ABNORMAL LOW (ref >60)
Glucose, Bld: 427 mg/dL — ABNORMAL HIGH (ref 70–99)
Potassium: 4.2 mmol/L (ref 3.5–5.1)
Sodium: 137 mmol/L (ref 135–145)
Total Bilirubin: 1.5 mg/dL — ABNORMAL HIGH (ref 0.3–1.2)
Total Protein: 8 g/dL (ref 6.5–8.1)

## 2019-09-18 MED ORDER — FAMOTIDINE 20 MG PO TABS: 20.0000 mg | ORAL_TABLET | Freq: Every day | ORAL | 0 refills | Status: DC

## 2019-09-18 NOTE — ED Triage Notes (Signed)
Patient tested postive for COVID on 1/22 and negative last Monday. States she feels SOB with exertion, says she can hear her heartbeat in her ears and she does not know why. Endorses chest pain and SOB, states the pain radiates to her back.

## 2019-09-18 NOTE — ED Provider Notes (Signed)
Breanna Pratt   CSN: [id number] Arrival date & time: 09/18/19  1646     History Chief Complaint  Patient presents with  . Shortness of Breath    Breanna Pratt is a 64 y.o. female.  Patient presents to ED for evaluation of upper anterior chest radiating to her neck with occasional feeling of shortness of breath for the last several days. Patient reports she has been having intermittent chest pain "for months". She has been evaluated by her PCP for the chest pain. She was recently COVID positive, and was treated with monoclonal anti-bodies. She appears anxious. No tachypnea, no increased oxygen demand. Mildly tachycardic.  The history is provided by the patient. No language interpreter was used.  Shortness of Breath Severity:  Mild Onset quality:  Gradual Duration:  2 days Timing:  Sporadic Progression:  Waxing and waning Chronicity:  New Associated symptoms: chest pain and neck pain   Associated symptoms: no abdominal pain and no syncope        Past Medical History:  Diagnosis Date  . 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 Breast cancer ([place]) 05/2017   stg 1, radiation therapy  . Chronic back pain   . Diabetes mellitus    takes Metformin daily;takes Humalog takes  8in am .12u at lunch and 15 at dinner.Lantus 35units at bedtime;;Average fasting sugars 100-120  . Eczema   . GERD (gastroesophageal reflux disease)    Prevacid prn  . Glaucoma   . Hemorrhoids   . Herpes   . Migraine   . Osteopenia   . Peripheral neuropathy    takes Gabapentin  . Personal history of radiation therapy     Patient Active Problem List   Diagnosis Date Noted  . Carcinoma of upper-outer quadrant of left breast in female, estrogen receptor positive ([place]) 03/18/2017  . Palpitations 05/12/2015  . Rotator cuff (capsule) sprain 07/23/2011    Past Surgical History:  Procedure Laterality Date  . ABDOMINAL HYSTERECTOMY  10+yrs ago  . BREAST LUMPECTOMY WITH  RADIOACTIVE SEED AND SENTINEL LYMPH NODE BIOPSY Left 04/12/2017   Procedure: LEFT BREAST LUMPECTOMY WITH RADIOACTIVE SEED AND SENTINEL LYMPH NODE BIOPSY WITH BLUE DYE INJECTION;  Surgeon: Donnie Mesa, MD;  Location: [facility];  Service: General;  Laterality: Left;  . COLONOSCOPY  2010  . CYSTOSCOPY     as a teenager  . DILATION AND CURETTAGE OF UTERUS  30+yrs ago   x 2  . SHOULDER ARTHROSCOPY WITH ROTATOR CUFF REPAIR Bilateral   . TRIGGER FINGER RELEASE  03/2019  . TUBAL LIGATION       OB History   No obstetric history on file.     Family History  Problem Relation Age of Onset  . Other Mother        brain tumor, ovarian cancer, blood, bone cancer  . Other Father        gsw complications  . Diabetes Father   . Other Maternal Grandfather        brain tumor  . Anesthesia problems Neg Hx   . Hypotension Neg Hx   . Malignant hyperthermia Neg Hx   . Pseudochol deficiency Neg Hx   . Breast cancer Neg Hx     Social History   Tobacco Use  . Smoking status: Never Smoker  . Smokeless tobacco: Never Used  Substance Use Topics  . Alcohol use: No  . Drug use: No    Home Medications Prior to  Admission medications   Medication Sig Start Date End Date Taking? Authorizing Provider  anastrozole (ARIMIDEX) 1 MG tablet Take 1 tablet (1 mg total) by mouth daily. 12/19/18   Nicholas Lose, MD  BD PEN NEEDLE NANO U/F 32G X 4 MM MISC  01/21/14   [provider]  gabapentin (NEURONTIN) 300 MG capsule Take 300 mg by mouth 2 (two) times daily.     [provider]  insulin lispro (HUMALOG) 100 UNIT/ML injection Inject 10-15 Units into the skin 3 (three) times daily before meals.     [provider]  latanoprost (XALATAN) 0.005 % ophthalmic solution Place 1 drop into both eyes at bedtime. 04/13/17   [provider]  loratadine (CLARITIN) 10 MG tablet Take 10 mg by mouth daily.    [provider]  ONE TOUCH ULTRA TEST test strip  02/20/14    [provider]  promethazine (PHENERGAN) 25 MG tablet Take 1 tablet (25 mg total) by mouth 2 (two) times daily as needed for nausea or vomiting. 06/06/19   Penumalli, Earlean Polka, MD  rizatriptan (MAXALT-MLT) 10 MG disintegrating tablet Take 1 tablet (10 mg total) by mouth as needed for migraine. May repeat in 2 hours if needed 06/06/19   Penumalli, Earlean Polka, MD  sitaGLIPtan-metformin (JANUMET) 50-1000 MG per tablet Take 1 tablet by mouth daily.     [provider]  topiramate (TOPAMAX) 50 MG tablet Take 1 tablet (50 mg total) by mouth 2 (two) times daily. 06/06/19   Penumalli, Earlean Polka, MD  lisinopril (PRINIVIL,ZESTRIL) 2.5 MG tablet Take 2.5 mg by mouth daily.    10/15/11  [provider]  metFORMIN (GLUCOPHAGE) 500 MG tablet Take 500 mg by mouth daily.    10/15/11  [provider]    Allergies    Altace [ramipril] and Mobic [meloxicam]  Review of Systems   Review of Systems  Respiratory: Positive for shortness of breath.   Cardiovascular: Positive for chest pain. Negative for syncope.  Gastrointestinal: Negative for abdominal pain.  Musculoskeletal: Positive for neck pain.  All other systems reviewed and are negative.   Physical Exam Updated Vital Signs BP (!) 119/58 (BP Location: Left Arm)   Pulse (!) 104   Temp 98.1 F (36.7 C) (Oral)   Resp 19   Ht 5\' 2"  (1.575 m)   Wt 53.1 kg   SpO2 100%   BMI 21.40 kg/m   Physical Exam Vitals and nursing Pratt reviewed.  Constitutional:      Appearance: She is well-developed.  HENT:     Head: Normocephalic.     Mouth/Throat:     Mouth: Mucous membranes are moist.  Eyes:     Conjunctiva/sclera: Conjunctivae normal.  Cardiovascular:     Rate and Rhythm: Regular rhythm. Tachycardia present.  Pulmonary:     Effort: Pulmonary effort is normal.     Breath sounds: Normal breath sounds.  Musculoskeletal:        General: Normal range of motion.     Cervical back: Neck supple.     Right lower leg: No  edema.     Left lower leg: No edema.  Skin:    General: Skin is warm and dry.  Neurological:     Mental Status: She is alert and oriented to person, place, and time.  Psychiatric:        Mood and Affect: Mood normal.     ED Results / Procedures / Treatments   Labs (all labs ordered are listed, but  only abnormal results are displayed) Labs Reviewed  CBC WITH DIFFERENTIAL/PLATELET - Abnormal; Notable for the following components:      Result Value   WBC 12.7 (*)    RBC 3.58 (*)    MCV 103.4 (*)    MCH 34.6 (*)    Neutro Abs 10.2 (*)    All other components within normal limits  COMPREHENSIVE METABOLIC PANEL - Abnormal; Notable for the following components:   CO2 21 (*)    Glucose, Bld 427 (*)    Creatinine, Ser 1.11 (*)    Total Bilirubin 1.5 (*)    GFR calc non Af Amer 53 (*)    All other components within normal limits  TROPONIN I (HIGH SENSITIVITY)  TROPONIN I (HIGH SENSITIVITY)    EKG EKG Interpretation  Date/Time:  Tuesday September 18 2019 17:06:14 EST Ventricular Rate:  105 PR Interval:    QRS Duration: 73 QT Interval:  374 QTC Calculation: 495 R Axis:   75 Text Interpretation: Sinus tachycardia Probable left atrial enlargement Borderline T abnormalities, diffuse leads inferior and lateral t wave flattening more than on previous tracing. Confirmed by Charlesetta Shanks [phone]) on 09/18/2019 11:08:02 PM   Radiology DG Chest 2 View  Result Date: 09/18/2019 CLINICAL DATA:  Shortness of breath EXAM: CHEST - 2 VIEW COMPARISON:  09/02/2019 FINDINGS: Heart and mediastinal contours are within normal limits. No focal opacities or effusions. No acute bony abnormality. IMPRESSION: Normal study. Electronically Signed   By: Rolm Baptise M.D.   On: 09/18/2019 17:33    Procedures Procedures (including critical care time)  Medications Ordered in ED Medications - No data to display  ED Course  I have reviewed the triage vital signs and the nursing notes.  Pertinent labs &  imaging results that were available during my care of the patient were reviewed by me and considered in my medical decision making (see chart for details).    MDM Rules/Calculators/A&P                      Patient is to be discharged with recommendation to follow up with PCP in regards to today's hospital visit. Chest pain is not likely of cardiac or pulmonary etiology d/t presentation, perc negative, VSS, no tracheal deviation, no JVD or new murmur, RRR, breath sounds equal bilaterally, EKG without acute abnormalities, negative troponin, and negative CXR. Pt has been advised start a PPI and return to the ED is CP becomes exertional, associated with diaphoresis or nausea, radiates to left jaw/arm, worsens or becomes concerning in any way. Pt appears reliable for follow up and is agreeable to discharge.   Patient is noted to have elevated blood sugar today. She endorses consumption of non-sugar free soda today, has not had her insulin. She is scheduled to follow-up with her endocrinologist next week.  Case has been discussed with and seen by Dr. Johnney Killian who agrees with the above plan to discharge.  Final Clinical Impression(s) / ED Diagnoses Final diagnoses:  SOB (shortness of breath)  Other chest pain    Rx / DC Orders ED Discharge Orders         Ordered    famotidine (PEPCID) 20 MG tablet  Daily     09/18/19 2336           Etta Quill, NP 09/18/19 2337    Charlesetta Shanks, MD 09/21/19 1520

## 2019-09-19 DIAGNOSIS — Z01411 Encounter for gynecological examination (general) (routine) with abnormal findings: Secondary | ICD-10-CM | POA: Diagnosis not present

## 2019-09-25 DIAGNOSIS — Z20828 Contact with and (suspected) exposure to other viral communicable diseases: Secondary | ICD-10-CM | POA: Diagnosis not present

## 2019-09-25 DIAGNOSIS — Z7189 Other specified counseling: Secondary | ICD-10-CM | POA: Diagnosis not present

## 2019-09-26 DIAGNOSIS — R102 Pelvic and perineal pain: Secondary | ICD-10-CM | POA: Diagnosis not present

## 2019-09-26 DIAGNOSIS — E1165 Type 2 diabetes mellitus with hyperglycemia: Secondary | ICD-10-CM | POA: Diagnosis not present

## 2019-10-03 DIAGNOSIS — Z9641 Presence of insulin pump (external) (internal): Secondary | ICD-10-CM | POA: Diagnosis not present

## 2019-10-03 DIAGNOSIS — E1165 Type 2 diabetes mellitus with hyperglycemia: Secondary | ICD-10-CM | POA: Diagnosis not present

## 2019-10-03 DIAGNOSIS — Z794 Long term (current) use of insulin: Secondary | ICD-10-CM | POA: Diagnosis not present

## 2019-10-03 DIAGNOSIS — E1140 Type 2 diabetes mellitus with diabetic neuropathy, unspecified: Secondary | ICD-10-CM | POA: Diagnosis not present

## 2019-10-24 DIAGNOSIS — Z794 Long term (current) use of insulin: Secondary | ICD-10-CM | POA: Diagnosis not present

## 2019-10-24 DIAGNOSIS — E1165 Type 2 diabetes mellitus with hyperglycemia: Secondary | ICD-10-CM | POA: Diagnosis not present

## 2019-10-24 DIAGNOSIS — E1140 Type 2 diabetes mellitus with diabetic neuropathy, unspecified: Secondary | ICD-10-CM | POA: Diagnosis not present

## 2019-10-24 DIAGNOSIS — Z9641 Presence of insulin pump (external) (internal): Secondary | ICD-10-CM | POA: Diagnosis not present

## 2019-11-14 DIAGNOSIS — Z794 Long term (current) use of insulin: Secondary | ICD-10-CM | POA: Diagnosis not present

## 2019-11-14 DIAGNOSIS — E1165 Type 2 diabetes mellitus with hyperglycemia: Secondary | ICD-10-CM | POA: Diagnosis not present

## 2019-11-14 DIAGNOSIS — E1140 Type 2 diabetes mellitus with diabetic neuropathy, unspecified: Secondary | ICD-10-CM | POA: Diagnosis not present

## 2019-11-14 DIAGNOSIS — Z9641 Presence of insulin pump (external) (internal): Secondary | ICD-10-CM | POA: Diagnosis not present

## 2019-11-23 DIAGNOSIS — M19042 Primary osteoarthritis, left hand: Secondary | ICD-10-CM | POA: Diagnosis not present

## 2019-11-23 DIAGNOSIS — E538 Deficiency of other specified B group vitamins: Secondary | ICD-10-CM | POA: Diagnosis not present

## 2019-11-23 DIAGNOSIS — R202 Paresthesia of skin: Secondary | ICD-10-CM | POA: Diagnosis not present

## 2019-11-23 DIAGNOSIS — M19041 Primary osteoarthritis, right hand: Secondary | ICD-10-CM | POA: Diagnosis not present

## 2019-12-05 ENCOUNTER — Ambulatory Visit: Payer: BC Managed Care – PPO | Admitting: Family Medicine

## 2019-12-06 ENCOUNTER — Ambulatory Visit: Payer: BC Managed Care – PPO | Attending: Internal Medicine

## 2019-12-06 DIAGNOSIS — Z23 Encounter for immunization: Secondary | ICD-10-CM

## 2019-12-06 NOTE — Progress Notes (Signed)
co

## 2019-12-06 NOTE — Progress Notes (Signed)
   Covid-19 Vaccination Clinic  Name:  Breanna Pratt    MRN: [id number] DOB: 03-22-1956  12/06/2019  [name] was observed post Covid-19 immunization for 15 minutes without incident. She was provided with Vaccine Information Sheet and instruction to access the V-Safe system.   [name] was instructed to call 911 with any severe reactions post vaccine: Marland Kitchen Difficulty breathing  . Swelling of face and throat  . A fast heartbeat  . A bad rash all over body  . Dizziness and weakness   Immunizations Administered    Name Date Dose VIS Date Route   Pfizer COVID-19 Vaccine 12/06/2019  2:26 PM 0.3 mL 10/03/2018 Intramuscular   Manufacturer: [employer]   Lot: [id number]   [place]: [id number]

## 2019-12-14 DIAGNOSIS — E1165 Type 2 diabetes mellitus with hyperglycemia: Secondary | ICD-10-CM | POA: Diagnosis not present

## 2019-12-25 ENCOUNTER — Encounter: Payer: Self-pay | Admitting: *Deleted

## 2019-12-26 ENCOUNTER — Other Ambulatory Visit: Payer: Self-pay

## 2019-12-26 ENCOUNTER — Ambulatory Visit: Payer: BC Managed Care – PPO | Admitting: Diagnostic Neuroimaging

## 2019-12-26 ENCOUNTER — Encounter: Payer: Self-pay | Admitting: Diagnostic Neuroimaging

## 2019-12-26 VITALS — BP 113/63 | HR 80 | Ht 62.0 in | Wt 117.0 lb

## 2019-12-26 DIAGNOSIS — M79641 Pain in right hand: Secondary | ICD-10-CM | POA: Diagnosis not present

## 2019-12-26 NOTE — Progress Notes (Signed)
[facility]  PATIENT: Breanna Pratt DOB: Dec 22, 1955  REFERRING CLINICIAN: Merita Norton HISTORY FROM: patient  REASON FOR VISIT: new problem   HISTORICAL  CHIEF COMPLAINT:  Chief Complaint  Patient presents with  . Paresthesia of left hand    rm 7  "one month of soreness, numbness, tingling, stiffness of right thumb, ring finger; no injury"     HISTORY OF PRESENT ILLNESS:   UPDATE (12/26/19, VRP): Since last visit, doing well with headaches. Now new problem of right > left hand pain, numbness; mainly thumbs x 2 months. Sometimes trigger fingers get locked.    UPDATE (06/06/19, VRP): 64 year old female here for evaluation of headaches.  Patient has had headaches since age 60 years old.  They were more severe when she was younger.  Now headaches have slightly changed.  Since last visit, doing about the same and averaging 4-5 major headaches per year.  She also has lower level daily headaches.  She is intermediate level headaches a few times a week.  She describes frontal throbbing sensation with nausea and dizziness.  Headaches tend to affect her right side more than the left.  Symptoms start with right ear pain.  No sensitive to light or sound.  Headaches can last 2 to 3 hours at a time.  No specific triggering factors.  At last visit patient tried topiramate and rizatriptan but does not remember if they helped.    PRIOR HPI (03/01/14, VRP): 64 year old right-handed female with diabetes, here for evaluation of headaches. For past 3 years patient has had frontal headaches, occipital headaches, right-sided headaches, right ear pain, TMJ pain in the right side, associated with sinus pressure, allergies, nausea, photophobia and phonophobia. No warning prior to onset of headache. Patient tried tramadol exertion without relief. Patient having at least 2 severe days of headache per month and 10 mild days of headaches per month. Patient had similar headaches since her teenage years with  pressure and throbbing sensation. Sometimes she is crawling sensation on her skin. Weather change can be a trigger. Patient never officially diagnosed with migraine headaches. Patient's mother has migraine headaches. Patient's mother also has a "brain tumor", possibly meningioma status post resection.   REVIEW OF SYSTEMS: Full 14 system review of systems performed and negative with exception of: Headache allergies runny nose weight loss.  ALLERGIES: Allergies  Allergen Reactions  . Altace [Ramipril] Other (See Comments)    angioedema  . Mobic [Meloxicam] Other (See Comments)    ineffective    HOME MEDICATIONS: Outpatient Medications Prior to Visit  Medication Sig Dispense Refill  . anastrozole (ARIMIDEX) 1 MG tablet Take 1 tablet (1 mg total) by mouth daily. 90 tablet 3  . BD PEN NEEDLE NANO U/F 32G X 4 MM MISC     . diclofenac Sodium (VOLTAREN) 1 % GEL     . famotidine (PEPCID) 20 MG tablet Take 1 tablet (20 mg total) by mouth daily. 30 tablet 0  . gabapentin (NEURONTIN) 300 MG capsule Take 300 mg by mouth 3 (three) times daily.     . insulin lispro (HUMALOG) 100 UNIT/ML injection Inject 8-11 Units into the skin 3 (three) times daily before meals. Per sliding scale    . latanoprost (XALATAN) 0.005 % ophthalmic solution Place 1 drop into both eyes at bedtime.    . ONE TOUCH ULTRA TEST test strip     . promethazine (PHENERGAN) 25 MG tablet Take 1 tablet (25 mg total) by mouth 2 (two) times daily as needed for  nausea or vomiting. 30 tablet 3  . rizatriptan (MAXALT-MLT) 10 MG disintegrating tablet Take 1 tablet (10 mg total) by mouth as needed for migraine. May repeat in 2 hours if needed 9 tablet 11  . sitaGLIPtan-metformin (JANUMET) 50-1000 MG per tablet Take 1 tablet by mouth daily.     Marland Kitchen topiramate (TOPAMAX) 50 MG tablet Take 1 tablet (50 mg total) by mouth 2 (two) times daily. 60 tablet 12   No facility-administered medications prior to visit.    PAST MEDICAL HISTORY: Past Medical  History:  Diagnosis Date  . 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 B12 deficiency   . Breast cancer ([place]) 05/2017   stg 1, radiation therapy  . Chronic back pain   . Diabetes mellitus    takes Metformin daily;takes Humalog takes  8in am .12u at lunch and 15 at dinner.Lantus 35units at bedtime;;Average fasting sugars 100-120  . Eczema   . GERD (gastroesophageal reflux disease)    Prevacid prn  . Glaucoma   . Hemorrhoids   . Herpes   . Migraine   . Osteopenia   . Paresthesias    hands  . Peripheral neuropathy    takes Gabapentin  . Personal history of radiation therapy     PAST SURGICAL HISTORY: Past Surgical History:  Procedure Laterality Date  . ABDOMINAL HYSTERECTOMY  10+yrs ago  . BREAST LUMPECTOMY WITH RADIOACTIVE SEED AND SENTINEL LYMPH NODE BIOPSY Left 04/12/2017   Procedure: LEFT BREAST LUMPECTOMY WITH RADIOACTIVE SEED AND SENTINEL LYMPH NODE BIOPSY WITH BLUE DYE INJECTION;  Surgeon: Donnie Mesa, MD;  Location: [facility];  Service: General;  Laterality: Left;  . COLONOSCOPY  2010  . CYSTOSCOPY     as a teenager  . DILATION AND CURETTAGE OF UTERUS  30+yrs ago   x 2  . SHOULDER ARTHROSCOPY WITH ROTATOR CUFF REPAIR Bilateral   . TRIGGER FINGER RELEASE  03/2019  . TUBAL LIGATION      FAMILY HISTORY: Family History  Problem Relation Age of Onset  . Other Mother        brain tumor, ovarian cancer, blood, bone cancer  . Other Father        gsw complications  . Diabetes Father   . Other Maternal Grandfather        brain tumor  . Anesthesia problems Neg Hx   . Hypotension Neg Hx   . Malignant hyperthermia Neg Hx   . Pseudochol deficiency Neg Hx   . Breast cancer Neg Hx     SOCIAL HISTORY: Social History   Socioeconomic History  . Marital status: Single    Spouse name: Not on file  . Number of children: 1  . Years of education: Not on file  . Highest education level: Associate degree: occupational, Hotel manager, or vocational program  Occupational  History  . 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 Smoking status: Never Smoker  . Smokeless tobacco: Never Used  Substance and Sexual Activity  . Alcohol use: No  . Drug use: No  . Sexual activity: Never    Birth control/protection: Surgical  Other Topics Concern  . Not on file  Social History Narrative   Patient lives at home with family.   Caffeine coffee 2-3 c daily   Social Determinants of Health   Financial Resource Strain:   . Difficulty of Paying Living Expenses:   Food Insecurity:   .  Worried About Charity fundraiser in the Last Year:   . Arboriculturist in the Last Year:   Transportation Needs:   . Film/video editor (Medical):   Marland Kitchen Lack of Transportation (Non-Medical):   Physical Activity:   . Days of Exercise per Week:   . Minutes of Exercise per Session:   Stress:   . Feeling of Stress :   Social Connections:   . Frequency of Communication with Friends and Family:   . Frequency of Social Gatherings with Friends and Family:   . Attends Religious Services:   . Active Member of Clubs or Organizations:   . Attends Archivist Meetings:   Marland Kitchen Marital Status:   Intimate Partner Violence:   . Fear of Current or Ex-Partner:   . Emotionally Abused:   Marland Kitchen Physically Abused:   . Sexually Abused:      PHYSICAL EXAM  GENERAL EXAM/CONSTITUTIONAL: Vitals:  Vitals:   12/26/19 1340  BP: 113/63  Pulse: 80  Weight: 117 lb (53.1 kg)  Height: 5\' 2"  (1.575 m)   Body mass index is 21.4 kg/m. Wt Readings from Last 3 Encounters:  12/26/19 117 lb (53.1 kg)  09/18/19 117 lb (53.1 kg)  06/06/19 111 lb 6.4 oz (50.5 kg)    Patient is in no distress; well developed, nourished and groomed; neck is supple  CARDIOVASCULAR:  Examination of carotid arteries is normal; no carotid bruits  Regular rate and rhythm, no murmurs  Examination of peripheral vascular system by observation and palpation  is normal  EYES:  Ophthalmoscopic exam of optic discs and posterior segments is normal; no papilledema or hemorrhages No exam data present  MUSCULOSKELETAL:  Gait, strength, tone, movements noted in Neurologic exam below  NEUROLOGIC: MENTAL STATUS:  No flowsheet data found.  awake, alert, oriented to person, place and time  recent and remote memory intact  normal attention and concentration  language fluent, comprehension intact, naming intact  fund of knowledge appropriate  CRANIAL NERVE:   2nd - no papilledema on fundoscopic exam  2nd, 3rd, 4th, 6th - pupils equal and reactive to light, visual fields full to confrontation, extraocular muscles intact, no nystagmus  5th - facial sensation symmetric  7th - facial strength symmetric  8th - hearing intact  9th - palate elevates symmetrically, uvula midline  11th - shoulder shrug symmetric  12th - tongue protrusion midline  MOTOR:   normal bulk and tone, full strength in the BUE, BLE  MILD ATROPHY OF BILATERAL APB  SENSORY:   normal and symmetric to light touch, temperature, vibration, pinprick  THICKENING FLEXOR TENDONS OF BILATERAL HANDS  NEGATIVE PHALENS  COORDINATION:   finger-nose-finger, fine finger movements normal  REFLEXES:   deep tendon reflexes TRACE and symmetric  GAIT/STATION:   narrow based gait     DIAGNOSTIC DATA (LABS, IMAGING, TESTING) - I reviewed patient records, labs, notes, testing and imaging myself where available.  Lab Results  Component Value Date   WBC 12.7 (H) 09/18/2019   HGB 12.4 09/18/2019   HCT 37.0 09/18/2019   MCV 103.4 (H) 09/18/2019   PLT 241 09/18/2019      Component Value Date/Time   NA 137 09/18/2019 2014   K 4.2 09/18/2019 2014   CL 102 09/18/2019 2014   CO2 21 (L) 09/18/2019 2014   GLUCOSE 427 (H) 09/18/2019 2014   BUN 23 09/18/2019 2014   CREATININE 1.11 (H) 09/18/2019 2014   CALCIUM 9.3 09/18/2019 2014   PROT 8.0  09/18/2019 2014    ALBUMIN 4.4 09/18/2019 2014   AST 26 09/18/2019 2014   ALT 34 09/18/2019 2014   ALKPHOS 101 09/18/2019 2014   BILITOT 1.5 (H) 09/18/2019 2014   GFRNONAA 53 (L) 09/18/2019 2014   GFRAA >60 09/18/2019 2014   No results found for: CHOL, HDL, LDLCALC, LDLDIRECT, TRIG, CHOLHDL No results found for: HGBA1C No results found for: VITAMINB12 Lab Results  Component Value Date   TSH 3.396 07/27/2010    12/02/10 MRI brain 1.  Partially empty sella which has been described in the setting of idiopathic intracranial hypertension (pseudotumor cerebri), but by itself has low specificity and no other secondary findings of that entity are identified in this case. 2.  Normal noncontrast MRI appearance of the brain otherwise.  03/01/14 CT  1. Partially empty sella, as noted on the prior MRI examination from 2012. Otherwise, no significant abnormalities are observed.  04/11/19 CT head [I reviewed images myself and agree with interpretation. -VRP]  - No acute abnormality.    ASSESSMENT AND PLAN  64 y.o. year old female here with migraine without aura since age 33 years old.   Dx:  1. Right hand pain      PLAN:  RIGHT > LEFT HAND PAIN (thumbs, joints, tendons; some numbness; tenosynovitis, arthritis, carpal tunnel syndrome) - check EMG/NCS - then consider hand surgery eval  - MIGRAINE PREVENTION  --> continue topiramate 50mg  twice a day; drink plenty of water  - MIGRAINE RESCUE  --> rizatriptan 10mg  as needed for breakthrough headache; may repeat x 1 after 2 hours; max 2 tabs per day or 8 per month --> phenergan 25mg  as needed for nausea  Orders Placed This Encounter  Procedures  . NCV with EMG(electromyography)   No orders of the defined types were placed in this encounter.  Return for for NCV/EMG.    Penni Bombard, MD [id number], [id number] PM Certified in Neurology, Neurophysiology and Neuroimaging  [facility] [address] [place], [place]  [address] [phone]

## 2019-12-31 ENCOUNTER — Ambulatory Visit: Payer: BC Managed Care – PPO | Attending: Internal Medicine

## 2019-12-31 DIAGNOSIS — Z23 Encounter for immunization: Secondary | ICD-10-CM

## 2019-12-31 NOTE — Progress Notes (Signed)
   Covid-19 Vaccination Clinic  Name:  Oreal Tikkanen    MRN: [id number] DOB: 1955-12-03  12/31/2019  Ms. Freiberger was observed post Covid-19 immunization for 15 minutes without incident. She was provided with Vaccine Information Sheet and instruction to access the V-Safe system.   Ms. Plager was instructed to call 911 with any severe reactions post vaccine: Marland Kitchen Difficulty breathing  . Swelling of face and throat  . A fast heartbeat  . A bad rash all over body  . Dizziness and weakness   Immunizations Administered    Name Date Dose VIS Date Route   Pfizer COVID-19 Vaccine 12/31/2019  8:16 AM 0.3 mL 10/03/2018 Intramuscular   Manufacturer: [place]   Lot: [id number]   [place]: [id number]

## 2020-01-05 ENCOUNTER — Other Ambulatory Visit: Payer: Self-pay | Admitting: Hematology and Oncology

## 2020-01-09 ENCOUNTER — Telehealth: Payer: Self-pay | Admitting: Hematology and Oncology

## 2020-01-09 NOTE — Telephone Encounter (Signed)
Scheduled per 6/1 sch message. Pt aware of appt.

## 2020-01-16 DIAGNOSIS — M79641 Pain in right hand: Secondary | ICD-10-CM | POA: Diagnosis not present

## 2020-01-24 ENCOUNTER — Encounter: Payer: BC Managed Care – PPO | Admitting: Diagnostic Neuroimaging

## 2020-01-24 ENCOUNTER — Ambulatory Visit (INDEPENDENT_AMBULATORY_CARE_PROVIDER_SITE_OTHER): Payer: BC Managed Care – PPO | Admitting: Diagnostic Neuroimaging

## 2020-01-24 ENCOUNTER — Other Ambulatory Visit: Payer: Self-pay

## 2020-01-24 DIAGNOSIS — M79641 Pain in right hand: Secondary | ICD-10-CM | POA: Diagnosis not present

## 2020-01-24 DIAGNOSIS — Z0289 Encounter for other administrative examinations: Secondary | ICD-10-CM

## 2020-01-24 NOTE — Procedures (Signed)
[facility]  NCS (NERVE CONDUCTION STUDY) WITH EMG (ELECTROMYOGRAPHY) REPORT   STUDY DATE: 01/24/20 PATIENT NAME: Breanna Pratt DOB: 1956-02-17 MRN: [id number]  ORDERING CLINICIAN: Andrey Spearman, MD   TECHNOLOGIST: Sherre Scarlet ELECTROMYOGRAPHER: Earlean Polka. Vennessa Affinito, MD  CLINICAL INFORMATION: 64 year old female with numbness in hands.  FINDINGS: NERVE CONDUCTION STUDY:  Right median motor response prolonged distal latency (5.0 ms) normal amplitude and normal conduction velocity.  Left median motor response and right ulnar motor responses are normal.  Bilateral median sensory responses are prolonged peak latencies of decreased amplitudes.    Right ulnar sensory response is normal.   NEEDLE ELECTROMYOGRAPHY:  Needle examination of right upper extremity is normal.   IMPRESSION:   Abnormal study demonstrating: - Bilateral median neuropathies at the wrist consistent with bilateral (right greater than left) carpal tunnel syndrome.    INTERPRETING PHYSICIAN:  Penni Bombard, MD Certified in Neurology, Neurophysiology and Neuroimaging  [facility] [address], [place], [place] [address] [phone]   [facility]    Nerve / Sites Muscle Latency Ref. Amplitude Ref. Rel Amp Segments Distance Velocity Ref. Area    ms ms mV mV %  cm m/s m/s mVms  R Median - APB     Wrist APB 5.0 ?4.4 4.4 ?4.0 100 Wrist - APB 7   19.6     Upper arm APB 9.1  4.2  95.3 Upper arm - Wrist 21 51 ?49 18.7  L Median - APB     Wrist APB 4.2 ?4.4 6.2 ?4.0 100 Wrist - APB 7   22.4     Upper arm APB 8.2  5.9  95.4 Upper arm - Wrist 20 50 ?49 21.9  R Ulnar - ADM     Wrist ADM 2.7 ?3.3 10.6 ?6.0 100 Wrist - ADM 7   34.8     B.Elbow ADM 6.0  10.3  97.4 B.Elbow - Wrist 19 57 ?49 34.4     A.Elbow ADM 7.9  10.3  99.9 A.Elbow - B.Elbow 10 55 ?49 34.5         A.Elbow - Wrist               SNC    Nerve / Sites Rec. Site Peak Lat Ref.  Amp Ref. Segments  Distance    ms ms V V  cm  R Median - Orthodromic (Dig II, Mid palm)     Dig II Wrist 4.1 ?3.4 5 ?10 Dig II - Wrist 13  L Median - Orthodromic (Dig II, Mid palm)     Dig II Wrist 3.5 ?3.4 7 ?10 Dig II - Wrist 13  R Ulnar - Orthodromic, (Dig V, Mid palm)     Dig V Wrist 2.5 ?3.1 9 ?5 Dig V - Wrist 28           F  Wave    Nerve F Lat Ref.   ms ms  R Ulnar - ADM 27.3 ?32.0       EMG Summary Table    Spontaneous MUAP Recruitment  Muscle IA Fib PSW Fasc Other Amp Dur. Poly Pattern  R. Deltoid Normal None None None _______ Normal Normal Normal Normal  R. Biceps brachii Normal None None None _______ Normal Normal Normal Normal  R. Triceps brachii Normal None None None _______ Normal Normal Normal Normal  R. Flexor carpi radialis Normal None None None _______ Normal Normal Normal Normal  R. First dorsal interosseous Normal None None None _______  Normal Normal Normal Normal

## 2020-01-29 DIAGNOSIS — Z03818 Encounter for observation for suspected exposure to other biological agents ruled out: Secondary | ICD-10-CM | POA: Diagnosis not present

## 2020-01-30 NOTE — Progress Notes (Signed)
Patient Care Team: Vernie Shanks, MD as PCP - General (Family Medicine) Nicholas Lose, MD as Consulting Physician (Hematology and Oncology) Delice Bison, Charlestine Massed, NP as Nurse Practitioner (Hematology and Oncology) Kyung Rudd, MD as Consulting Physician (Radiation Oncology) Donnie Mesa, MD as Consulting Physician (General Surgery)  DIAGNOSIS:    ICD-10-CM   1. Carcinoma of upper-outer quadrant of left breast in female, estrogen receptor positive ([place])  C50.412    Z17.0     SUMMARY OF ONCOLOGIC HISTORY: Oncology History  Carcinoma of upper-outer quadrant of left breast in female, estrogen receptor positive ([place])  03/10/2017 Initial Diagnosis   Invasive lobular cancer grade 2, ER 100%, PR 100%, HER-2 negative ratio 1.51, Ki-67 15%   03/23/2017 Breast MRI   Irregular mass in the retroareolar left breast 1.7 x 1 x 1.3 cm, no abnormal lymph nodes, T1 CN 0 stage IA   04/12/2017 Surgery   Left lumpectomy: Fibrocystic changes; superior margin: Invasive pleomorphic lobular carcinoma grade 3, 1.5 cm, perineural invasion and lymphovascular invasion present, pleomorphic LCIS, margins negative, 0/5 lymph nodes negative, ER 100%, PR 100%, HER-2 negative, Ki-67 15% T1 CN 0 stage IA   04/25/2017 Oncotype testing   Oncotype DX score 19: 12% risk of recurrence with tamoxifen alone over 10 years   05/23/2017 - 06/20/2017 Radiation Therapy   Adjuvant radiation therapy   06/27/2017 -  Anti-estrogen oral therapy   Anastrozole daily     CHIEF COMPLIANT: Follow-up of left breast cancer on anastrozole  INTERVAL HISTORY: Breanna Pratt is a 64 y.o. with above-mentioned history of left breast cancer treated with lumpectomy, radiation, and who is currently on antiestrogen therapy with anastrozole. Mammogram on 06/27/19 showed no evidence of malignancy bilaterally. She presents to the clinic today for annual follow-up.    ALLERGIES:  is allergic to altace [ramipril] and mobic  [meloxicam].  MEDICATIONS:  Current Outpatient Medications  Medication Sig Dispense Refill  . anastrozole (ARIMIDEX) 1 MG tablet TAKE 1 TABLET BY MOUTH EVERY DAY 90 tablet 0  . BD PEN NEEDLE NANO U/F 32G X 4 MM MISC     . diclofenac Sodium (VOLTAREN) 1 % GEL     . famotidine (PEPCID) 20 MG tablet Take 1 tablet (20 mg total) by mouth daily. 30 tablet 0  . gabapentin (NEURONTIN) 300 MG capsule Take 300 mg by mouth 3 (three) times daily.     . insulin lispro (HUMALOG) 100 UNIT/ML injection Inject 8-11 Units into the skin 3 (three) times daily before meals. Per sliding scale    . latanoprost (XALATAN) 0.005 % ophthalmic solution Place 1 drop into both eyes at bedtime.    . ONE TOUCH ULTRA TEST test strip     . promethazine (PHENERGAN) 25 MG tablet Take 1 tablet (25 mg total) by mouth 2 (two) times daily as needed for nausea or vomiting. 30 tablet 3  . rizatriptan (MAXALT-MLT) 10 MG disintegrating tablet Take 1 tablet (10 mg total) by mouth as needed for migraine. May repeat in 2 hours if needed 9 tablet 11  . sitaGLIPtan-metformin (JANUMET) 50-1000 MG per tablet Take 1 tablet by mouth daily.     Marland Kitchen topiramate (TOPAMAX) 50 MG tablet Take 1 tablet (50 mg total) by mouth 2 (two) times daily. 60 tablet 12   No current facility-administered medications for this visit.    PHYSICAL EXAMINATION: ECOG PERFORMANCE STATUS: 1 - Symptomatic but completely ambulatory  Vitals:   01/31/20 0939  BP: 127/77  Pulse: 79  Resp: 20  Temp:  98.2 F (36.8 C)  SpO2: 100%   Filed Weights   01/31/20 0939  Weight: 125 lb 6.4 oz (56.9 kg)    BREAST: No palpable masses or nodules in either right or left breasts. No palpable axillary supraclavicular or infraclavicular adenopathy no breast tenderness or nipple discharge. (exam performed in the presence of a chaperone)  LABORATORY DATA:  I have reviewed the data as listed CMP Latest Ref Rng & Units 09/18/2019 04/11/2019 04/07/2017  Glucose 70 - 99 mg/dL 427(H) 319(H)  292(H)  BUN 8 - 23 mg/dL _0 Creatinine 0.44 - 1.00 mg/dL 1.11(H) 1.06(H) 0.75  Sodium 135 - 145 mmol/L 137 135 136  Potassium 3.5 - 5.1 mmol/L 4.2 4.5 4.7  Chloride 98 - 111 mmol/L 102 102 103  CO2 22 - 32 mmol/L 21(L) 22 26  Calcium 8.9 - 10.3 mg/dL 9.3 10.1 9.0  Total Protein 6.5 - 8.1 g/dL 8.0 - -  Total Bilirubin 0.3 - 1.2 mg/dL 1.5(H) - -  Alkaline Phos 38 - 126 U/L 101 - -  AST 15 - 41 U/L 26 - -  ALT 0 - 44 U/L 34 - -    Lab Results  Component Value Date   WBC 12.7 (H) 09/18/2019   HGB 12.4 09/18/2019   HCT 37.0 09/18/2019   MCV 103.4 (H) 09/18/2019   PLT 241 09/18/2019   NEUTROABS 10.2 (H) 09/18/2019    ASSESSMENT & PLAN:  Carcinoma of upper-outer quadrant of left breast in female, estrogen receptor positive ([place]) 09/04/2018Left lumpectomy: Fibrocystic changes; superior margin: Invasive pleomorphic lobular carcinoma grade 3, 1.5 cm, perineural invasion and lymphovascular invasion present, pleomorphic LCIS, margins negative, 0/5 lymph nodes negative, ER 100%, PR 100%, HER-2 negative, Ki-67 15% T1 CN 0 stage IA  Oncotype DX score19: 12% risk of recurrence with tamoxifen alone over 10 years  Adjuvant radiation therapy 05/23/2017-06/20/2017  Treatment plan: Adjuvant antiestrogen therapy with anastrozole 1 mg p.o. daily times 5-7years started November 2018  Anastrozole toxicities: Denies any hot flashes.  She does have joint and muscle stiffness.  She also has a trigger thumb. Because of severe pain in the middle of the palm she is planning to undergo surgery for her hand sometime this year. He was also informed that she may have carpal tunnel but she is not planning to do any surgery for that yet.  Breast cancer surveillance:  06/27/2019: Benign breast density category B 01/31/20: Breast Exam: benign  Return to clinic in 1 year for follow-up   No orders of the defined types were placed in this encounter.  The patient has a good understanding of the  overall plan. she agrees with it. she will call with any problems that may develop before the next visit here.  Total time spent: 20 mins including face to face time and time spent for planning, charting and coordination of care  Nicholas Lose, MD 01/31/2020  I, Cloyde Reams Dorshimer, am acting as scribe for Dr. Nicholas Lose.  I have reviewed the above documentation for accuracy and completeness, and I agree with the above.

## 2020-01-31 ENCOUNTER — Other Ambulatory Visit: Payer: Self-pay

## 2020-01-31 ENCOUNTER — Inpatient Hospital Stay: Payer: BC Managed Care – PPO | Attending: Hematology and Oncology | Admitting: Hematology and Oncology

## 2020-01-31 DIAGNOSIS — Z79811 Long term (current) use of aromatase inhibitors: Secondary | ICD-10-CM | POA: Diagnosis not present

## 2020-01-31 DIAGNOSIS — Z170 Estrogen receptor positive status [ER+]: Secondary | ICD-10-CM | POA: Diagnosis not present

## 2020-01-31 DIAGNOSIS — C50412 Malignant neoplasm of upper-outer quadrant of left female breast: Secondary | ICD-10-CM | POA: Insufficient documentation

## 2020-01-31 MED ORDER — ANASTROZOLE 1 MG PO TABS: 1.0000 mg | ORAL_TABLET | Freq: Every day | ORAL | 3 refills | Status: DC

## 2020-01-31 NOTE — Assessment & Plan Note (Signed)
09/04/2018Left lumpectomy: Fibrocystic changes; superior margin: Invasive pleomorphic lobular carcinoma grade 3, 1.5 cm, perineural invasion and lymphovascular invasion present, pleomorphic LCIS, margins negative, 0/5 lymph nodes negative, ER 100%, PR 100%, HER-2 negative, Ki-67 15% T1 CN 0 stage IA  Oncotype DX score19: 12% risk of recurrence with tamoxifen alone over 10 years  Adjuvant radiation therapy 05/23/2017-06/20/2017  Treatment plan: Adjuvant antiestrogen therapy with anastrozole 1 mg p.o. daily times 5-7years  Anastrozole toxicities: Denies any hot flashes.  She does have joint and muscle stiffness.  She also has a trigger thumb. She is going to apply over-the-counter medication for this.    Breast cancer surveillance:  06/27/2019: Benign breast density category B 01/31/20: Breast Exam: benign  Return to clinic in 1 year for follow-up

## 2020-02-01 DIAGNOSIS — L84 Corns and callosities: Secondary | ICD-10-CM | POA: Diagnosis not present

## 2020-02-01 DIAGNOSIS — M21962 Unspecified acquired deformity of left lower leg: Secondary | ICD-10-CM | POA: Diagnosis not present

## 2020-02-01 DIAGNOSIS — B351 Tinea unguium: Secondary | ICD-10-CM | POA: Diagnosis not present

## 2020-02-01 DIAGNOSIS — E1351 Other specified diabetes mellitus with diabetic peripheral angiopathy without gangrene: Secondary | ICD-10-CM | POA: Diagnosis not present

## 2020-02-19 DIAGNOSIS — M65312 Trigger thumb, left thumb: Secondary | ICD-10-CM | POA: Diagnosis not present

## 2020-02-19 DIAGNOSIS — M79645 Pain in left finger(s): Secondary | ICD-10-CM | POA: Diagnosis not present

## 2020-02-19 DIAGNOSIS — M65311 Trigger thumb, right thumb: Secondary | ICD-10-CM | POA: Diagnosis not present

## 2020-02-19 DIAGNOSIS — M65341 Trigger finger, right ring finger: Secondary | ICD-10-CM | POA: Diagnosis not present

## 2020-03-06 DIAGNOSIS — M65341 Trigger finger, right ring finger: Secondary | ICD-10-CM | POA: Diagnosis not present

## 2020-03-06 DIAGNOSIS — M65311 Trigger thumb, right thumb: Secondary | ICD-10-CM | POA: Diagnosis not present

## 2020-03-17 DIAGNOSIS — E1165 Type 2 diabetes mellitus with hyperglycemia: Secondary | ICD-10-CM | POA: Diagnosis not present

## 2020-03-17 DIAGNOSIS — M79645 Pain in left finger(s): Secondary | ICD-10-CM | POA: Diagnosis not present

## 2020-03-19 DIAGNOSIS — C50912 Malignant neoplasm of unspecified site of left female breast: Secondary | ICD-10-CM | POA: Diagnosis not present

## 2020-03-26 DIAGNOSIS — M79645 Pain in left finger(s): Secondary | ICD-10-CM | POA: Diagnosis not present

## 2020-04-02 DIAGNOSIS — M79645 Pain in left finger(s): Secondary | ICD-10-CM | POA: Diagnosis not present

## 2020-04-09 DIAGNOSIS — M79645 Pain in left finger(s): Secondary | ICD-10-CM | POA: Diagnosis not present

## 2020-04-15 DIAGNOSIS — E1165 Type 2 diabetes mellitus with hyperglycemia: Secondary | ICD-10-CM | POA: Diagnosis not present

## 2020-04-15 DIAGNOSIS — E1140 Type 2 diabetes mellitus with diabetic neuropathy, unspecified: Secondary | ICD-10-CM | POA: Diagnosis not present

## 2020-04-15 DIAGNOSIS — Z9641 Presence of insulin pump (external) (internal): Secondary | ICD-10-CM | POA: Diagnosis not present

## 2020-04-15 DIAGNOSIS — Z794 Long term (current) use of insulin: Secondary | ICD-10-CM | POA: Diagnosis not present

## 2020-04-15 DIAGNOSIS — Z03818 Encounter for observation for suspected exposure to other biological agents ruled out: Secondary | ICD-10-CM | POA: Diagnosis not present

## 2020-04-23 DIAGNOSIS — D3122 Benign neoplasm of left retina: Secondary | ICD-10-CM | POA: Diagnosis not present

## 2020-04-23 DIAGNOSIS — H401133 Primary open-angle glaucoma, bilateral, severe stage: Secondary | ICD-10-CM | POA: Diagnosis not present

## 2020-05-21 DIAGNOSIS — Z4789 Encounter for other orthopedic aftercare: Secondary | ICD-10-CM | POA: Diagnosis not present

## 2020-05-21 DIAGNOSIS — G5601 Carpal tunnel syndrome, right upper limb: Secondary | ICD-10-CM | POA: Diagnosis not present

## 2020-05-26 DIAGNOSIS — Z23 Encounter for immunization: Secondary | ICD-10-CM | POA: Diagnosis not present

## 2020-05-26 DIAGNOSIS — M545 Low back pain, unspecified: Secondary | ICD-10-CM | POA: Diagnosis not present

## 2020-06-11 ENCOUNTER — Other Ambulatory Visit: Payer: Self-pay | Admitting: Surgery

## 2020-06-11 DIAGNOSIS — Z9889 Other specified postprocedural states: Secondary | ICD-10-CM

## 2020-07-23 DIAGNOSIS — G5601 Carpal tunnel syndrome, right upper limb: Secondary | ICD-10-CM | POA: Diagnosis not present

## 2020-07-23 DIAGNOSIS — M65341 Trigger finger, right ring finger: Secondary | ICD-10-CM | POA: Diagnosis not present

## 2020-07-23 DIAGNOSIS — M65312 Trigger thumb, left thumb: Secondary | ICD-10-CM | POA: Diagnosis not present

## 2020-07-23 DIAGNOSIS — M65311 Trigger thumb, right thumb: Secondary | ICD-10-CM | POA: Diagnosis not present

## 2020-07-30 ENCOUNTER — Ambulatory Visit
Admission: RE | Admit: 2020-07-30 | Discharge: 2020-07-30 | Disposition: A | Discharge status: Home / Self Care | Payer: BC Managed Care – PPO | Source: Ambulatory Visit | Attending: Surgery | Admitting: Surgery

## 2020-07-30 ENCOUNTER — Other Ambulatory Visit: Payer: Self-pay

## 2020-07-30 DIAGNOSIS — Z9889 Other specified postprocedural states: Secondary | ICD-10-CM

## 2020-07-30 DIAGNOSIS — R928 Other abnormal and inconclusive findings on diagnostic imaging of breast: Secondary | ICD-10-CM | POA: Diagnosis not present

## 2020-07-30 DIAGNOSIS — Z853 Personal history of malignant neoplasm of breast: Secondary | ICD-10-CM | POA: Diagnosis not present

## 2020-08-13 DIAGNOSIS — L720 Epidermal cyst: Secondary | ICD-10-CM | POA: Diagnosis not present

## 2020-08-13 DIAGNOSIS — L308 Other specified dermatitis: Secondary | ICD-10-CM | POA: Diagnosis not present

## 2020-08-14 DIAGNOSIS — C50912 Malignant neoplasm of unspecified site of left female breast: Secondary | ICD-10-CM | POA: Diagnosis not present

## 2020-08-20 DIAGNOSIS — Z9641 Presence of insulin pump (external) (internal): Secondary | ICD-10-CM | POA: Diagnosis not present

## 2020-08-20 DIAGNOSIS — E1165 Type 2 diabetes mellitus with hyperglycemia: Secondary | ICD-10-CM | POA: Diagnosis not present

## 2020-08-20 DIAGNOSIS — E1140 Type 2 diabetes mellitus with diabetic neuropathy, unspecified: Secondary | ICD-10-CM | POA: Diagnosis not present

## 2020-08-20 DIAGNOSIS — Z794 Long term (current) use of insulin: Secondary | ICD-10-CM | POA: Diagnosis not present

## 2020-08-29 DIAGNOSIS — Z03818 Encounter for observation for suspected exposure to other biological agents ruled out: Secondary | ICD-10-CM | POA: Diagnosis not present

## 2020-09-08 DIAGNOSIS — Z794 Long term (current) use of insulin: Secondary | ICD-10-CM | POA: Diagnosis not present

## 2020-09-08 DIAGNOSIS — M25521 Pain in right elbow: Secondary | ICD-10-CM | POA: Diagnosis not present

## 2020-09-08 DIAGNOSIS — E1159 Type 2 diabetes mellitus with other circulatory complications: Secondary | ICD-10-CM | POA: Diagnosis not present

## 2020-09-08 DIAGNOSIS — Z0000 Encounter for general adult medical examination without abnormal findings: Secondary | ICD-10-CM | POA: Diagnosis not present

## 2020-09-08 DIAGNOSIS — G5603 Carpal tunnel syndrome, bilateral upper limbs: Secondary | ICD-10-CM | POA: Diagnosis not present

## 2020-09-08 DIAGNOSIS — E538 Deficiency of other specified B group vitamins: Secondary | ICD-10-CM | POA: Diagnosis not present

## 2020-09-08 DIAGNOSIS — E559 Vitamin D deficiency, unspecified: Secondary | ICD-10-CM | POA: Diagnosis not present

## 2020-09-24 DIAGNOSIS — M65341 Trigger finger, right ring finger: Secondary | ICD-10-CM | POA: Diagnosis not present

## 2020-09-24 DIAGNOSIS — G5601 Carpal tunnel syndrome, right upper limb: Secondary | ICD-10-CM | POA: Diagnosis not present

## 2020-09-24 DIAGNOSIS — M65311 Trigger thumb, right thumb: Secondary | ICD-10-CM | POA: Diagnosis not present

## 2020-09-24 DIAGNOSIS — M65312 Trigger thumb, left thumb: Secondary | ICD-10-CM | POA: Diagnosis not present

## 2020-10-15 DIAGNOSIS — E119 Type 2 diabetes mellitus without complications: Secondary | ICD-10-CM | POA: Diagnosis not present

## 2020-10-20 DIAGNOSIS — Z03818 Encounter for observation for suspected exposure to other biological agents ruled out: Secondary | ICD-10-CM | POA: Diagnosis not present

## 2020-11-12 DIAGNOSIS — R102 Pelvic and perineal pain: Secondary | ICD-10-CM | POA: Diagnosis not present

## 2020-11-12 DIAGNOSIS — Z01419 Encounter for gynecological examination (general) (routine) without abnormal findings: Secondary | ICD-10-CM | POA: Diagnosis not present

## 2020-11-15 ENCOUNTER — Other Ambulatory Visit: Payer: Self-pay | Admitting: Nurse Practitioner

## 2020-11-15 DIAGNOSIS — M8580 Other specified disorders of bone density and structure, unspecified site: Secondary | ICD-10-CM

## 2020-11-17 DIAGNOSIS — Z03818 Encounter for observation for suspected exposure to other biological agents ruled out: Secondary | ICD-10-CM | POA: Diagnosis not present

## 2020-11-27 DIAGNOSIS — Z9641 Presence of insulin pump (external) (internal): Secondary | ICD-10-CM | POA: Diagnosis not present

## 2020-11-27 DIAGNOSIS — Z794 Long term (current) use of insulin: Secondary | ICD-10-CM | POA: Diagnosis not present

## 2020-11-27 DIAGNOSIS — E1140 Type 2 diabetes mellitus with diabetic neuropathy, unspecified: Secondary | ICD-10-CM | POA: Diagnosis not present

## 2020-11-27 DIAGNOSIS — E1165 Type 2 diabetes mellitus with hyperglycemia: Secondary | ICD-10-CM | POA: Diagnosis not present

## 2020-12-10 DIAGNOSIS — M62838 Other muscle spasm: Secondary | ICD-10-CM | POA: Diagnosis not present

## 2020-12-10 DIAGNOSIS — R102 Pelvic and perineal pain: Secondary | ICD-10-CM | POA: Diagnosis not present

## 2020-12-11 DIAGNOSIS — Z9641 Presence of insulin pump (external) (internal): Secondary | ICD-10-CM | POA: Diagnosis not present

## 2020-12-11 DIAGNOSIS — E1165 Type 2 diabetes mellitus with hyperglycemia: Secondary | ICD-10-CM | POA: Diagnosis not present

## 2020-12-22 DIAGNOSIS — G5601 Carpal tunnel syndrome, right upper limb: Secondary | ICD-10-CM | POA: Diagnosis not present

## 2021-01-02 DIAGNOSIS — M79641 Pain in right hand: Secondary | ICD-10-CM | POA: Diagnosis not present

## 2021-01-14 DIAGNOSIS — M79641 Pain in right hand: Secondary | ICD-10-CM | POA: Diagnosis not present

## 2021-01-22 DIAGNOSIS — M79641 Pain in right hand: Secondary | ICD-10-CM | POA: Diagnosis not present

## 2021-01-27 NOTE — Assessment & Plan Note (Deleted)
09/04/2018Left lumpectomy: Fibrocystic changes; superior margin: Invasive pleomorphic lobular carcinoma grade 3, 1.5 cm, perineural invasion and lymphovascular invasion present, pleomorphic LCIS, margins negative, 0/5 lymph nodes negative, ER 100%, PR 100%, HER-2 negative, Ki-67 15% T1 CN 0 stage IA  Oncotype DX score19: 12% risk of recurrence with tamoxifen alone over 10 years  Adjuvant radiation therapy 05/23/2017-06/20/2017  Treatment plan: Adjuvant antiestrogen therapy with anastrozole 1 mg p.o. daily times 5-7years started November 2018  Anastrozoletoxicities:Denies any hot flashes. She does have joint and muscle stiffness. She also has a trigger thumb. Because of severe pain in the middle of the palm she is planning to undergo surgery for her hand sometime this year. He was also informed that she may have carpal tunnel but she is not planning to do any surgery for that yet.  Breast cancer surveillance:  07/30/20: Benign breast density category B 01/28/21: Breast Exam: benign  Return to clinic in 1 year for follow-up

## 2021-01-28 ENCOUNTER — Inpatient Hospital Stay: Payer: BC Managed Care – PPO | Attending: Hematology and Oncology | Admitting: Hematology and Oncology

## 2021-01-28 DIAGNOSIS — M79641 Pain in right hand: Secondary | ICD-10-CM | POA: Diagnosis not present

## 2021-01-28 DIAGNOSIS — C50412 Malignant neoplasm of upper-outer quadrant of left female breast: Secondary | ICD-10-CM

## 2021-02-06 DIAGNOSIS — N390 Urinary tract infection, site not specified: Secondary | ICD-10-CM | POA: Diagnosis not present

## 2021-02-07 ENCOUNTER — Other Ambulatory Visit: Payer: Self-pay | Admitting: Hematology and Oncology

## 2021-02-18 DIAGNOSIS — M62838 Other muscle spasm: Secondary | ICD-10-CM | POA: Diagnosis not present

## 2021-02-18 DIAGNOSIS — R102 Pelvic and perineal pain: Secondary | ICD-10-CM | POA: Diagnosis not present

## 2021-03-06 DIAGNOSIS — M1712 Unilateral primary osteoarthritis, left knee: Secondary | ICD-10-CM | POA: Diagnosis not present

## 2021-03-06 DIAGNOSIS — M7052 Other bursitis of knee, left knee: Secondary | ICD-10-CM | POA: Diagnosis not present

## 2021-03-09 DIAGNOSIS — R102 Pelvic and perineal pain: Secondary | ICD-10-CM | POA: Diagnosis not present

## 2021-03-09 DIAGNOSIS — M62838 Other muscle spasm: Secondary | ICD-10-CM | POA: Diagnosis not present

## 2021-03-11 ENCOUNTER — Ambulatory Visit
Admission: RE | Admit: 2021-03-11 | Discharge: 2021-03-11 | Disposition: A | Discharge status: Home / Self Care | Payer: BC Managed Care – PPO | Source: Ambulatory Visit | Attending: Family Medicine | Admitting: Family Medicine

## 2021-03-11 ENCOUNTER — Other Ambulatory Visit: Payer: Self-pay | Admitting: Family Medicine

## 2021-03-11 DIAGNOSIS — R079 Chest pain, unspecified: Secondary | ICD-10-CM

## 2021-03-11 DIAGNOSIS — Z6822 Body mass index (BMI) 22.0-22.9, adult: Secondary | ICD-10-CM | POA: Diagnosis not present

## 2021-03-11 DIAGNOSIS — E1159 Type 2 diabetes mellitus with other circulatory complications: Secondary | ICD-10-CM | POA: Diagnosis not present

## 2021-03-11 DIAGNOSIS — M8580 Other specified disorders of bone density and structure, unspecified site: Secondary | ICD-10-CM | POA: Diagnosis not present

## 2021-03-11 DIAGNOSIS — I10 Essential (primary) hypertension: Secondary | ICD-10-CM | POA: Diagnosis not present

## 2021-03-11 DIAGNOSIS — R071 Chest pain on breathing: Secondary | ICD-10-CM | POA: Diagnosis not present

## 2021-03-17 DIAGNOSIS — R102 Pelvic and perineal pain: Secondary | ICD-10-CM | POA: Diagnosis not present

## 2021-03-17 DIAGNOSIS — M62838 Other muscle spasm: Secondary | ICD-10-CM | POA: Diagnosis not present

## 2021-03-18 DIAGNOSIS — M1712 Unilateral primary osteoarthritis, left knee: Secondary | ICD-10-CM | POA: Diagnosis not present

## 2021-03-18 DIAGNOSIS — M25562 Pain in left knee: Secondary | ICD-10-CM | POA: Diagnosis not present

## 2021-03-25 DIAGNOSIS — M1712 Unilateral primary osteoarthritis, left knee: Secondary | ICD-10-CM | POA: Diagnosis not present

## 2021-03-25 DIAGNOSIS — M25562 Pain in left knee: Secondary | ICD-10-CM | POA: Diagnosis not present

## 2021-03-30 DIAGNOSIS — Z23 Encounter for immunization: Secondary | ICD-10-CM | POA: Diagnosis not present

## 2021-04-01 DIAGNOSIS — C50912 Malignant neoplasm of unspecified site of left female breast: Secondary | ICD-10-CM | POA: Diagnosis not present

## 2021-04-03 DIAGNOSIS — M79672 Pain in left foot: Secondary | ICD-10-CM | POA: Diagnosis not present

## 2021-04-07 DIAGNOSIS — M79673 Pain in unspecified foot: Secondary | ICD-10-CM | POA: Diagnosis not present

## 2021-04-08 DIAGNOSIS — E1165 Type 2 diabetes mellitus with hyperglycemia: Secondary | ICD-10-CM | POA: Diagnosis not present

## 2021-04-08 DIAGNOSIS — E1140 Type 2 diabetes mellitus with diabetic neuropathy, unspecified: Secondary | ICD-10-CM | POA: Diagnosis not present

## 2021-04-08 DIAGNOSIS — Z794 Long term (current) use of insulin: Secondary | ICD-10-CM | POA: Diagnosis not present

## 2021-04-08 DIAGNOSIS — Z9641 Presence of insulin pump (external) (internal): Secondary | ICD-10-CM | POA: Diagnosis not present

## 2021-04-22 DIAGNOSIS — M7672 Peroneal tendinitis, left leg: Secondary | ICD-10-CM | POA: Diagnosis not present

## 2021-04-22 DIAGNOSIS — M79672 Pain in left foot: Secondary | ICD-10-CM | POA: Diagnosis not present

## 2021-05-08 DIAGNOSIS — Z9641 Presence of insulin pump (external) (internal): Secondary | ICD-10-CM | POA: Diagnosis not present

## 2021-05-08 DIAGNOSIS — E1140 Type 2 diabetes mellitus with diabetic neuropathy, unspecified: Secondary | ICD-10-CM | POA: Diagnosis not present

## 2021-05-08 DIAGNOSIS — Z794 Long term (current) use of insulin: Secondary | ICD-10-CM | POA: Diagnosis not present

## 2021-05-08 DIAGNOSIS — E1165 Type 2 diabetes mellitus with hyperglycemia: Secondary | ICD-10-CM | POA: Diagnosis not present

## 2021-05-09 ENCOUNTER — Other Ambulatory Visit: Payer: Self-pay | Admitting: Hematology and Oncology

## 2021-05-28 ENCOUNTER — Other Ambulatory Visit: Payer: Self-pay | Admitting: Hematology and Oncology

## 2021-06-03 DIAGNOSIS — S92525D Nondisplaced fracture of medial phalanx of left lesser toe(s), subsequent encounter for fracture with routine healing: Secondary | ICD-10-CM | POA: Diagnosis not present

## 2021-06-03 DIAGNOSIS — M79672 Pain in left foot: Secondary | ICD-10-CM | POA: Diagnosis not present

## 2021-06-08 DIAGNOSIS — Z9641 Presence of insulin pump (external) (internal): Secondary | ICD-10-CM | POA: Diagnosis not present

## 2021-06-16 NOTE — Progress Notes (Signed)
Patient Care Team: Ileana Ladd, MD as PCP - General (Family Medicine) Serena Croissant, MD as Consulting Physician (Hematology and Oncology) Axel Filler, Larna Daughters, NP as Nurse Practitioner (Hematology and Oncology) Dorothy Puffer, MD as Consulting Physician (Radiation Oncology) Manus Rudd, MD as Consulting Physician (General Surgery)  DIAGNOSIS:    ICD-10-CM   1. Carcinoma of upper-outer quadrant of left breast in female, estrogen receptor positive (HCC)  C50.412    Z17.0       SUMMARY OF ONCOLOGIC HISTORY: Oncology History  Carcinoma of upper-outer quadrant of left breast in female, estrogen receptor positive (HCC)  03/10/2017 Initial Diagnosis   Invasive lobular cancer grade 2, ER 100%, PR 100%, HER-2 negative ratio 1.51, Ki-67 15%   03/23/2017 Breast MRI   Irregular mass in the retroareolar left breast 1.7 x 1 x 1.3 cm, no abnormal lymph nodes, T1 CN 0 stage IA   04/12/2017 Surgery   Left lumpectomy: Fibrocystic changes; superior margin: Invasive pleomorphic lobular carcinoma grade 3, 1.5 cm, perineural invasion and lymphovascular invasion present, pleomorphic LCIS, margins negative, 0/5 lymph nodes negative, ER 100%, PR 100%, HER-2 negative, Ki-67 15% T1 CN 0 stage IA   04/25/2017 Oncotype testing   Oncotype DX score 19: 12% risk of recurrence with tamoxifen alone over 10 years   05/23/2017 - 06/20/2017 Radiation Therapy   Adjuvant radiation therapy   06/27/2017 -  Anti-estrogen oral therapy   Anastrozole daily     CHIEF COMPLIANT: Follow-up of left breast cancer on anastrozole  INTERVAL HISTORY: Breanna Pratt is a 65 y.o. with above-mentioned history of left breast cancer treated with lumpectomy, radiation, and who is currently on antiestrogen therapy with anastrozole. Mammogram on 07/30/2020 showed no evidence of malignancy bilaterally. She presents to the clinic today for annual follow-up.  She is tolerating anastrozole extremely well without any problems or concerns.   Denies any lumps or nodules in the breast.  ALLERGIES:  is allergic to altace [ramipril] and mobic [meloxicam].  MEDICATIONS:  Current Outpatient Medications  Medication Sig Dispense Refill   anastrozole (ARIMIDEX) 1 MG tablet TAKE 1 TABLET BY MOUTH EVERY DAY 30 tablet 0   BD PEN NEEDLE NANO U/F 32G X 4 MM MISC      diclofenac Sodium (VOLTAREN) 1 % GEL      famotidine (PEPCID) 20 MG tablet Take 1 tablet (20 mg total) by mouth daily. 30 tablet 0   gabapentin (NEURONTIN) 300 MG capsule Take 300 mg by mouth 3 (three) times daily.      insulin lispro (HUMALOG) 100 UNIT/ML injection Inject 8-11 Units into the skin 3 (three) times daily before meals. Per sliding scale     latanoprost (XALATAN) 0.005 % ophthalmic solution Place 1 drop into both eyes at bedtime.     ONE TOUCH ULTRA TEST test strip      promethazine (PHENERGAN) 25 MG tablet Take 1 tablet (25 mg total) by mouth 2 (two) times daily as needed for nausea or vomiting. 30 tablet 3   rizatriptan (MAXALT-MLT) 10 MG disintegrating tablet Take 1 tablet (10 mg total) by mouth as needed for migraine. May repeat in 2 hours if needed 9 tablet 11   sitaGLIPtan-metformin (JANUMET) 50-1000 MG per tablet Take 1 tablet by mouth daily.      topiramate (TOPAMAX) 50 MG tablet Take 1 tablet (50 mg total) by mouth 2 (two) times daily. 60 tablet 12   No current facility-administered medications for this visit.    PHYSICAL EXAMINATION: ECOG PERFORMANCE STATUS: 1 -  Symptomatic but completely ambulatory  Vitals:   06/17/21 0816  BP: (!) 119/54  Pulse: 75  Resp: 18  Temp: 97.7 F (36.5 C)  SpO2: 98%   Filed Weights   06/17/21 0816  Weight: 125 lb 9.6 oz (57 kg)    BREAST: No palpable masses or nodules in either right or left breasts. No palpable axillary supraclavicular or infraclavicular adenopathy no breast tenderness or nipple discharge. (exam performed in the presence of a chaperone)  LABORATORY DATA:  I have reviewed the data as  listed CMP Latest Ref Rng & Units 09/18/2019 04/11/2019 04/07/2017  Glucose 70 - 99 mg/dL 427(H) 319(H) 292(H)  BUN 8 - 23 mg/dL $Remove'23 19 15  'XWvIFna$ Creatinine 0.44 - 1.00 mg/dL 1.11(H) 1.06(H) 0.75  Sodium 135 - 145 mmol/L 137 135 136  Potassium 3.5 - 5.1 mmol/L 4.2 4.5 4.7  Chloride 98 - 111 mmol/L 102 102 103  CO2 22 - 32 mmol/L 21(L) 22 26  Calcium 8.9 - 10.3 mg/dL 9.3 10.1 9.0  Total Protein 6.5 - 8.1 g/dL 8.0 - -  Total Bilirubin 0.3 - 1.2 mg/dL 1.5(H) - -  Alkaline Phos 38 - 126 U/L 101 - -  AST 15 - 41 U/L 26 - -  ALT 0 - 44 U/L 34 - -    Lab Results  Component Value Date   WBC 12.7 (H) 09/18/2019   HGB 12.4 09/18/2019   HCT 37.0 09/18/2019   MCV 103.4 (H) 09/18/2019   PLT 241 09/18/2019   NEUTROABS 10.2 (H) 09/18/2019    ASSESSMENT & PLAN:  Carcinoma of upper-outer quadrant of left breast in female, estrogen receptor positive ([place]) 04/12/2017 Left lumpectomy: Fibrocystic changes; superior margin: Invasive pleomorphic lobular carcinoma grade 3, 1.5 cm, perineural invasion and lymphovascular invasion present, pleomorphic LCIS, margins negative, 0/5 lymph nodes negative, ER 100%, PR 100%, HER-2 negative, Ki-67 15% T1 CN 0 stage IA   Oncotype DX score 19: 12% risk of recurrence with tamoxifen alone over 10 years   Adjuvant radiation therapy 05/23/2017- 06/20/2017   Treatment plan: Adjuvant antiestrogen therapy with anastrozole 1 mg p.o. daily times 5-7 years started November 2018   Anastrozole toxicities: Denies any hot flashes.  She does have joint and muscle stiffness.  She also has a trigger thumb and carpal tunnel syndrome  Breast cancer surveillance:  07/30/2020: Mammogram:: Benign breast density category B 06/17/2021: Breast Exam: benign   Return to clinic in 1 year for follow-up    No orders of the defined types were placed in this encounter.  The patient has a good understanding of the overall plan. she agrees with it. she will call with any problems that may develop  before the next visit here.  Total time spent: 20 mins including face to face time and time spent for planning, charting and coordination of care  Rulon Eisenmenger, MD, MPH 06/17/2021  I, Thana Ates, am acting as scribe for Dr. Nicholas Lose.  I have reviewed the above documentation for accuracy and completeness, and I agree with the above.

## 2021-06-17 ENCOUNTER — Inpatient Hospital Stay: Payer: BC Managed Care – PPO | Attending: Hematology and Oncology | Admitting: Hematology and Oncology

## 2021-06-17 ENCOUNTER — Other Ambulatory Visit: Payer: Self-pay

## 2021-06-17 DIAGNOSIS — Z170 Estrogen receptor positive status [ER+]: Secondary | ICD-10-CM | POA: Insufficient documentation

## 2021-06-17 DIAGNOSIS — C50412 Malignant neoplasm of upper-outer quadrant of left female breast: Secondary | ICD-10-CM | POA: Insufficient documentation

## 2021-06-17 DIAGNOSIS — Z79811 Long term (current) use of aromatase inhibitors: Secondary | ICD-10-CM | POA: Insufficient documentation

## 2021-06-17 MED ORDER — ANASTROZOLE 1 MG PO TABS: 1.0000 mg | ORAL_TABLET | Freq: Every day | ORAL | 3 refills | Status: DC

## 2021-06-17 NOTE — Assessment & Plan Note (Signed)
09/04/2018Left lumpectomy: Fibrocystic changes; superior margin: Invasive pleomorphic lobular carcinoma grade 3, 1.5 cm, perineural invasion and lymphovascular invasion present, pleomorphic LCIS, margins negative, 0/5 lymph nodes negative, ER 100%, PR 100%, HER-2 negative, Ki-67 15% T1 CN 0 stage IA  Oncotype DX score19: 12% risk of recurrence with tamoxifen alone over 10 years  Adjuvant radiation therapy 05/23/2017-06/20/2017  Treatment plan: Adjuvant antiestrogen therapy with anastrozole 1 mg p.o. daily times 5-7years started November 2018  Anastrozoletoxicities:Denies any hot flashes. She does have joint and muscle stiffness. She also has a trigger thumb and carpal tunnel syndrome  Breast cancer surveillance:  07/30/2020: Mammogram:: Benign breast density category B 06/17/2021: Breast Exam: benign  Return to clinic in 1 year for follow-up

## 2021-06-24 DIAGNOSIS — Z23 Encounter for immunization: Secondary | ICD-10-CM | POA: Diagnosis not present

## 2021-07-08 ENCOUNTER — Other Ambulatory Visit: Payer: Self-pay | Admitting: Family Medicine

## 2021-07-08 ENCOUNTER — Other Ambulatory Visit: Payer: Self-pay | Admitting: Nurse Practitioner

## 2021-07-08 DIAGNOSIS — Z1231 Encounter for screening mammogram for malignant neoplasm of breast: Secondary | ICD-10-CM

## 2021-07-08 DIAGNOSIS — M8580 Other specified disorders of bone density and structure, unspecified site: Secondary | ICD-10-CM

## 2021-07-19 DIAGNOSIS — R109 Unspecified abdominal pain: Secondary | ICD-10-CM | POA: Diagnosis not present

## 2021-07-24 DIAGNOSIS — Z9641 Presence of insulin pump (external) (internal): Secondary | ICD-10-CM | POA: Diagnosis not present

## 2021-07-24 DIAGNOSIS — E1165 Type 2 diabetes mellitus with hyperglycemia: Secondary | ICD-10-CM | POA: Diagnosis not present

## 2021-07-24 DIAGNOSIS — E1140 Type 2 diabetes mellitus with diabetic neuropathy, unspecified: Secondary | ICD-10-CM | POA: Diagnosis not present

## 2021-07-24 DIAGNOSIS — Z794 Long term (current) use of insulin: Secondary | ICD-10-CM | POA: Diagnosis not present

## 2021-08-05 DIAGNOSIS — E1140 Type 2 diabetes mellitus with diabetic neuropathy, unspecified: Secondary | ICD-10-CM | POA: Diagnosis not present

## 2021-08-05 DIAGNOSIS — E1165 Type 2 diabetes mellitus with hyperglycemia: Secondary | ICD-10-CM | POA: Diagnosis not present

## 2021-08-05 DIAGNOSIS — Z794 Long term (current) use of insulin: Secondary | ICD-10-CM | POA: Diagnosis not present

## 2021-08-05 DIAGNOSIS — Z9641 Presence of insulin pump (external) (internal): Secondary | ICD-10-CM | POA: Diagnosis not present

## 2021-08-13 ENCOUNTER — Ambulatory Visit
Admission: RE | Admit: 2021-08-13 | Discharge: 2021-08-13 | Disposition: A | Discharge status: Home / Self Care | Payer: BC Managed Care – PPO | Source: Ambulatory Visit | Attending: Family Medicine | Admitting: Family Medicine

## 2021-08-13 DIAGNOSIS — Z1231 Encounter for screening mammogram for malignant neoplasm of breast: Secondary | ICD-10-CM | POA: Diagnosis not present

## 2021-09-03 DIAGNOSIS — M1990 Unspecified osteoarthritis, unspecified site: Secondary | ICD-10-CM | POA: Diagnosis not present

## 2021-09-03 DIAGNOSIS — M25521 Pain in right elbow: Secondary | ICD-10-CM | POA: Diagnosis not present

## 2021-09-03 DIAGNOSIS — M19042 Primary osteoarthritis, left hand: Secondary | ICD-10-CM | POA: Diagnosis not present

## 2021-09-10 DIAGNOSIS — H9201 Otalgia, right ear: Secondary | ICD-10-CM | POA: Diagnosis not present

## 2021-09-10 DIAGNOSIS — Z794 Long term (current) use of insulin: Secondary | ICD-10-CM | POA: Diagnosis not present

## 2021-09-10 DIAGNOSIS — E538 Deficiency of other specified B group vitamins: Secondary | ICD-10-CM | POA: Diagnosis not present

## 2021-09-10 DIAGNOSIS — E1159 Type 2 diabetes mellitus with other circulatory complications: Secondary | ICD-10-CM | POA: Diagnosis not present

## 2021-09-11 DIAGNOSIS — E538 Deficiency of other specified B group vitamins: Secondary | ICD-10-CM | POA: Diagnosis not present

## 2021-09-11 DIAGNOSIS — E559 Vitamin D deficiency, unspecified: Secondary | ICD-10-CM | POA: Diagnosis not present

## 2021-09-11 DIAGNOSIS — E1159 Type 2 diabetes mellitus with other circulatory complications: Secondary | ICD-10-CM | POA: Diagnosis not present

## 2021-09-11 DIAGNOSIS — Z0000 Encounter for general adult medical examination without abnormal findings: Secondary | ICD-10-CM | POA: Diagnosis not present

## 2021-09-11 DIAGNOSIS — I10 Essential (primary) hypertension: Secondary | ICD-10-CM | POA: Diagnosis not present

## 2021-09-21 DIAGNOSIS — M7052 Other bursitis of knee, left knee: Secondary | ICD-10-CM | POA: Diagnosis not present

## 2021-09-21 DIAGNOSIS — M25562 Pain in left knee: Secondary | ICD-10-CM | POA: Diagnosis not present

## 2021-10-01 DIAGNOSIS — M25562 Pain in left knee: Secondary | ICD-10-CM | POA: Diagnosis not present

## 2021-10-09 DIAGNOSIS — M84352D Stress fracture, left femur, subsequent encounter for fracture with routine healing: Secondary | ICD-10-CM | POA: Diagnosis not present

## 2021-11-18 DIAGNOSIS — L292 Pruritus vulvae: Secondary | ICD-10-CM | POA: Diagnosis not present

## 2021-11-18 DIAGNOSIS — Z01419 Encounter for gynecological examination (general) (routine) without abnormal findings: Secondary | ICD-10-CM | POA: Diagnosis not present

## 2021-11-18 DIAGNOSIS — N952 Postmenopausal atrophic vaginitis: Secondary | ICD-10-CM | POA: Diagnosis not present

## 2021-11-30 ENCOUNTER — Ambulatory Visit
Admission: RE | Admit: 2021-11-30 | Discharge: 2021-11-30 | Disposition: A | Discharge status: Home / Self Care | Payer: BC Managed Care – PPO | Source: Ambulatory Visit | Attending: Nurse Practitioner | Admitting: Nurse Practitioner

## 2021-11-30 DIAGNOSIS — M8589 Other specified disorders of bone density and structure, multiple sites: Secondary | ICD-10-CM | POA: Diagnosis not present

## 2021-11-30 DIAGNOSIS — M8580 Other specified disorders of bone density and structure, unspecified site: Secondary | ICD-10-CM

## 2021-11-30 DIAGNOSIS — Z780 Asymptomatic menopausal state: Secondary | ICD-10-CM | POA: Diagnosis not present

## 2021-12-18 IMAGING — CR DG CHEST 2V
2 series · 2 of 2 positions shown · non-contrast
Comparison: 09/18/2019

CLINICAL DATA: Chest pain, unspecified type.

EXAM:
CHEST - 2 VIEW

[w chest pa]
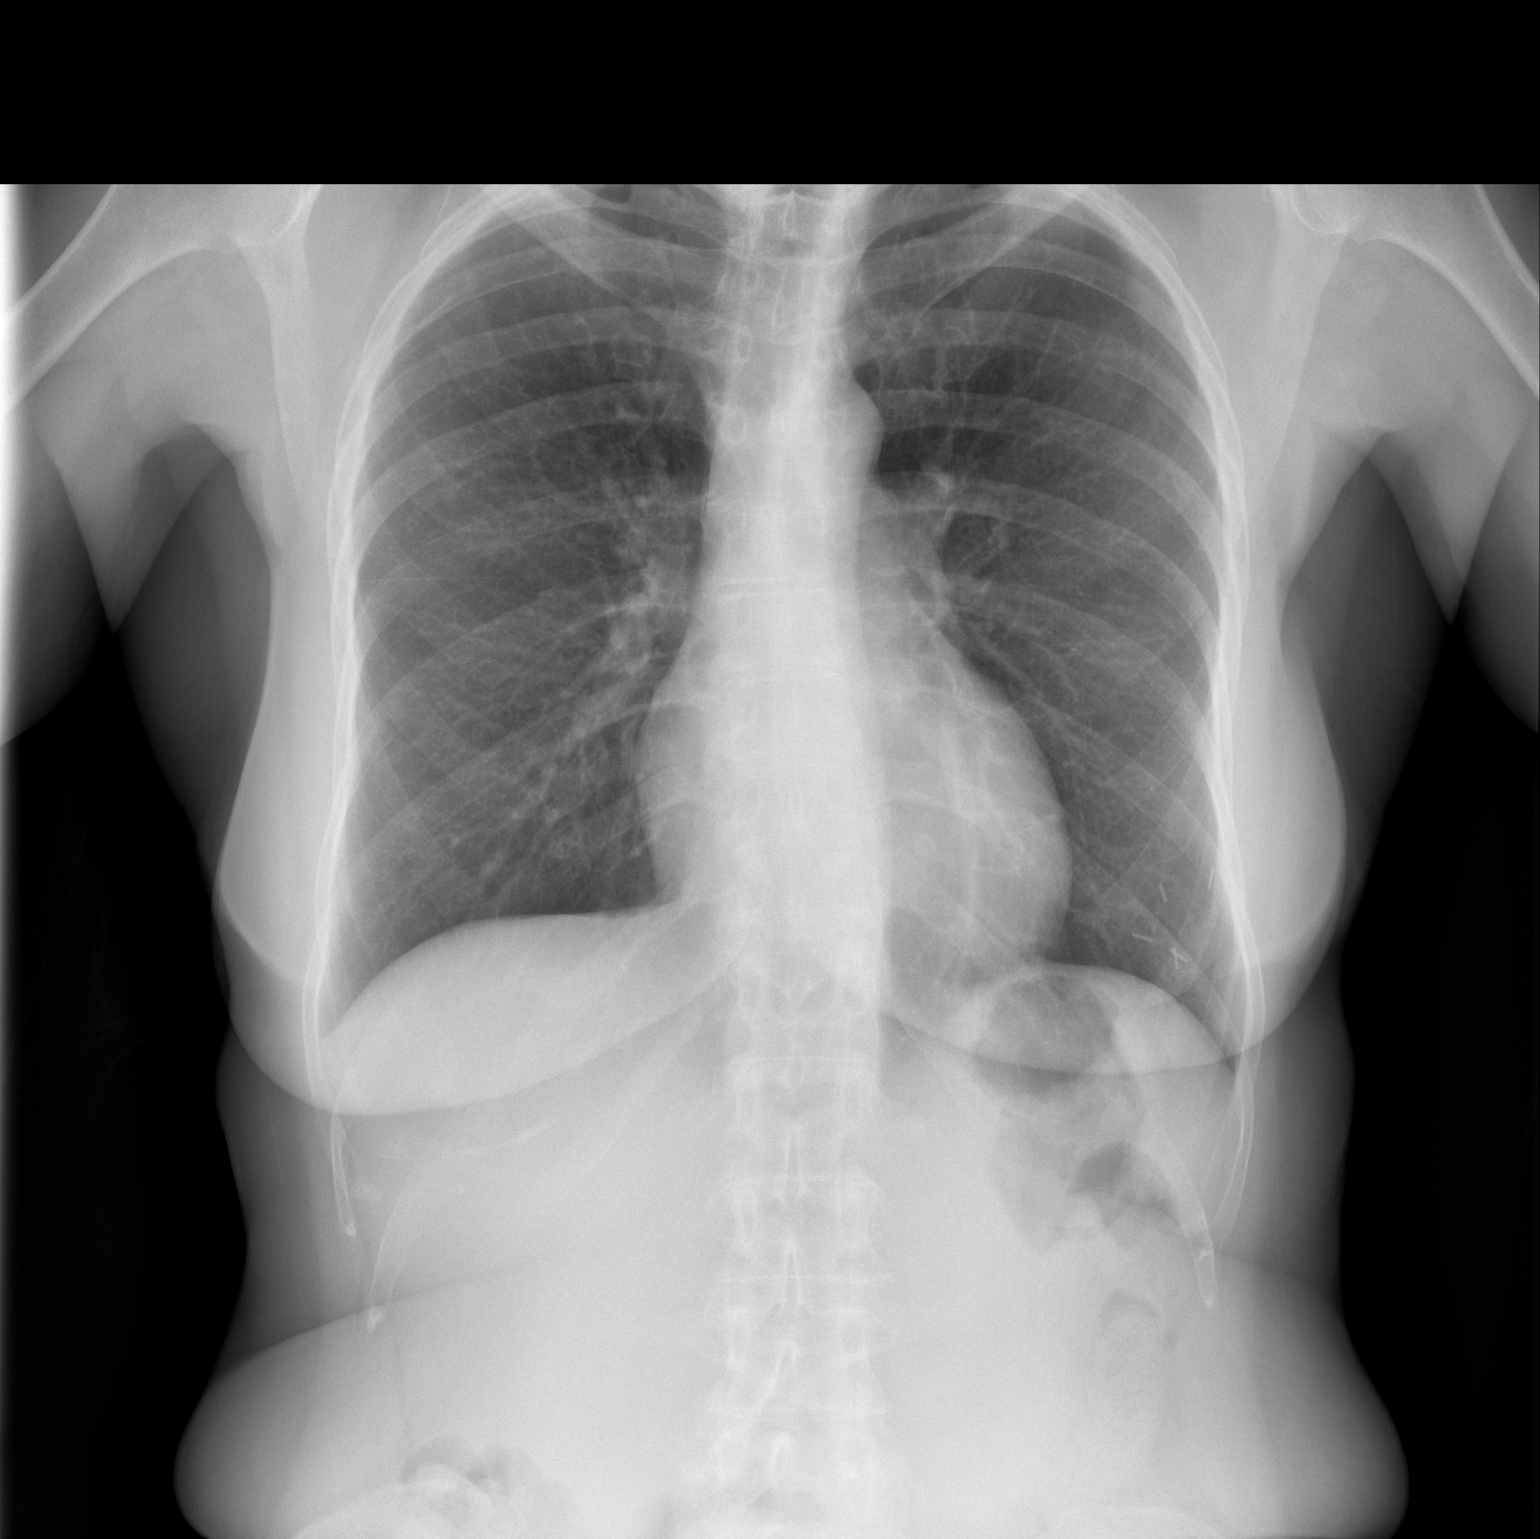

[w chest lat]
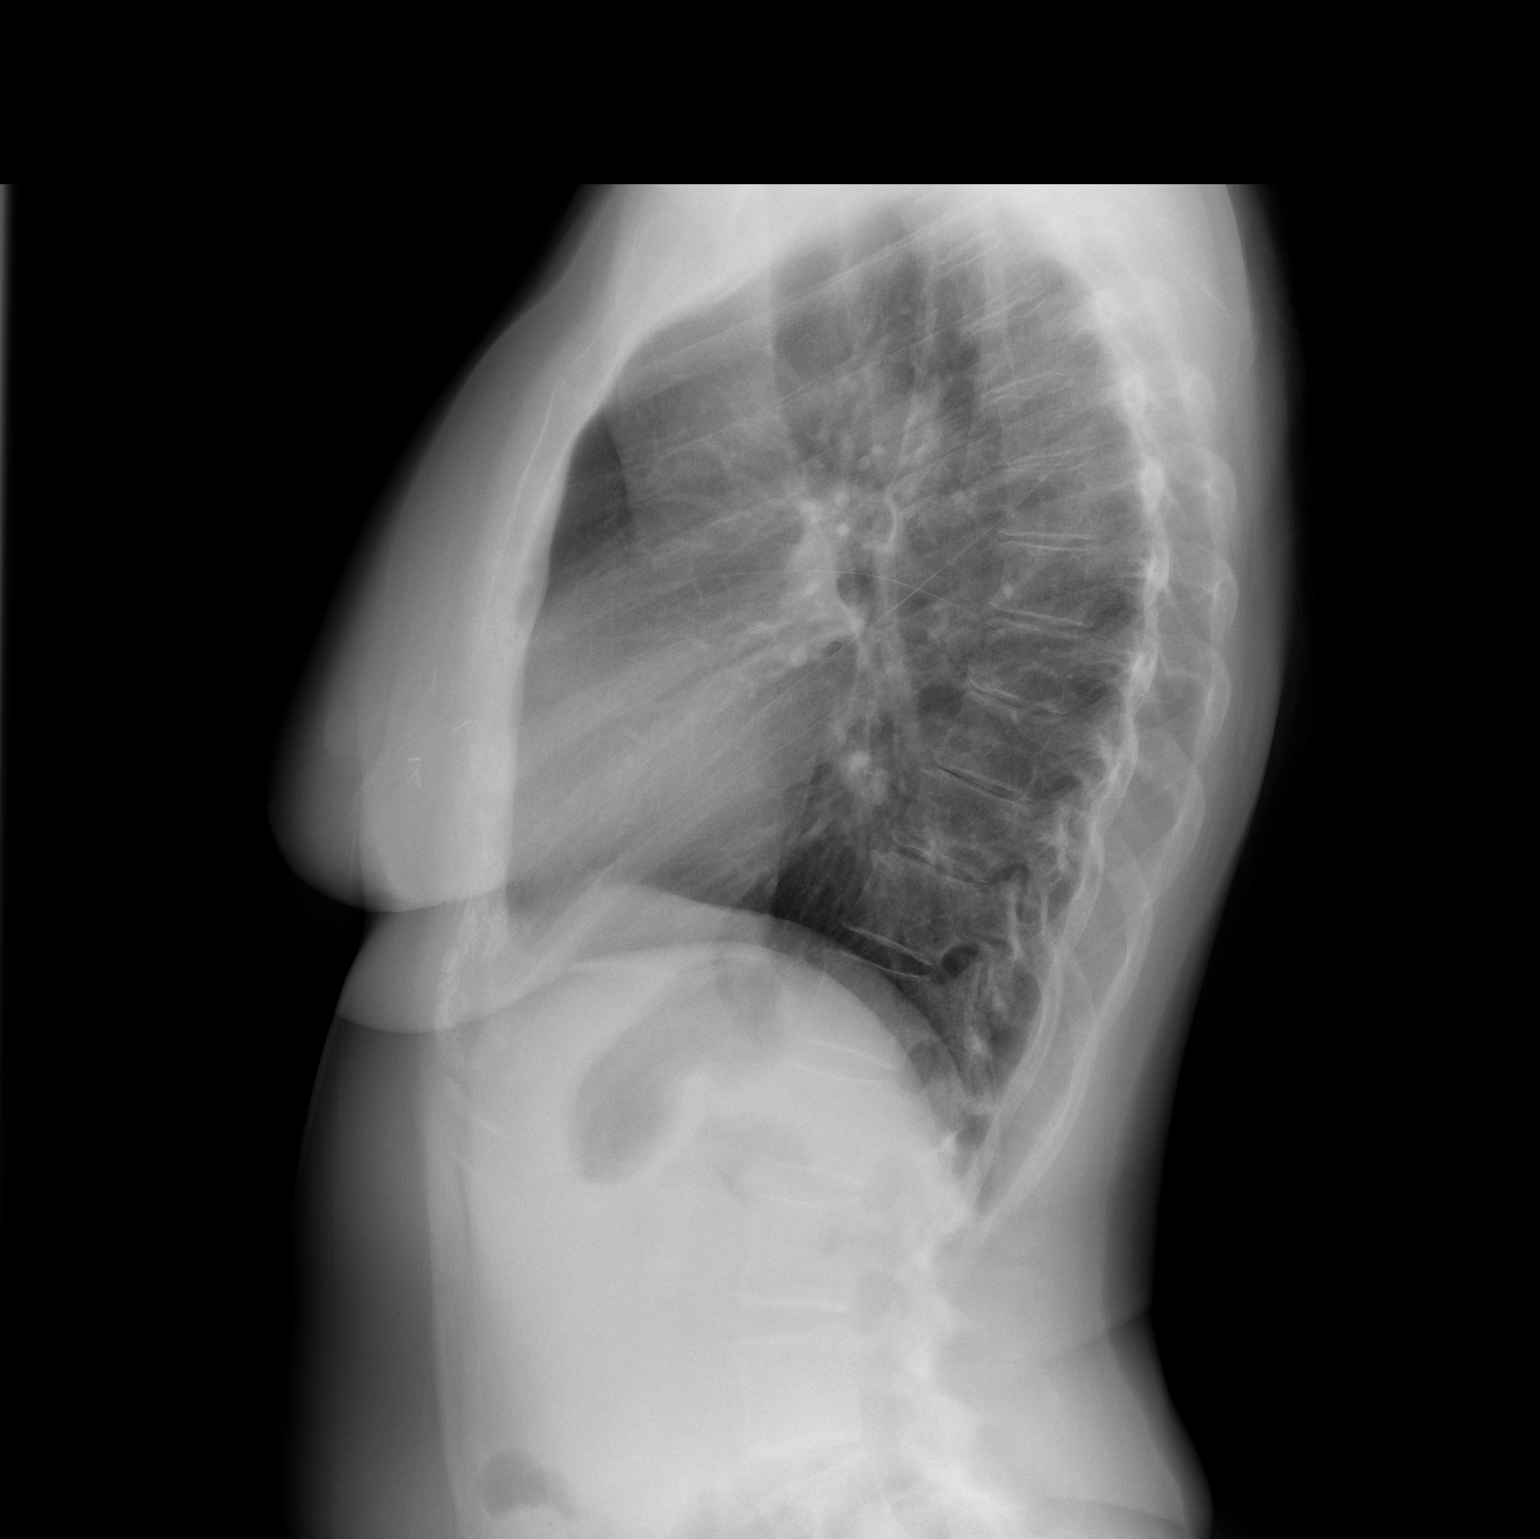

[2 of 2 positions shown; findings below may reference images not displayed]

FINDINGS: Again noted are surgical clips in the left breast. Both lungs are
clear. Heart and mediastinum are within normal limits. No pleural
effusions. No acute bone abnormality. Negative for a pneumothorax.
IMPRESSION: No active cardiopulmonary disease.

## 2021-12-30 DIAGNOSIS — M84352D Stress fracture, left femur, subsequent encounter for fracture with routine healing: Secondary | ICD-10-CM | POA: Diagnosis not present

## 2022-01-13 DIAGNOSIS — E1165 Type 2 diabetes mellitus with hyperglycemia: Secondary | ICD-10-CM | POA: Diagnosis not present

## 2022-01-13 DIAGNOSIS — E1140 Type 2 diabetes mellitus with diabetic neuropathy, unspecified: Secondary | ICD-10-CM | POA: Diagnosis not present

## 2022-01-20 DIAGNOSIS — E1165 Type 2 diabetes mellitus with hyperglycemia: Secondary | ICD-10-CM | POA: Diagnosis not present

## 2022-01-20 DIAGNOSIS — E1140 Type 2 diabetes mellitus with diabetic neuropathy, unspecified: Secondary | ICD-10-CM | POA: Diagnosis not present

## 2022-01-20 DIAGNOSIS — Z794 Long term (current) use of insulin: Secondary | ICD-10-CM | POA: Diagnosis not present

## 2022-01-20 DIAGNOSIS — Z9641 Presence of insulin pump (external) (internal): Secondary | ICD-10-CM | POA: Diagnosis not present

## 2022-02-17 DIAGNOSIS — L258 Unspecified contact dermatitis due to other agents: Secondary | ICD-10-CM | POA: Diagnosis not present

## 2022-02-17 DIAGNOSIS — L308 Other specified dermatitis: Secondary | ICD-10-CM | POA: Diagnosis not present

## 2022-02-17 DIAGNOSIS — D2371 Other benign neoplasm of skin of right lower limb, including hip: Secondary | ICD-10-CM | POA: Diagnosis not present

## 2022-03-24 DIAGNOSIS — R1012 Left upper quadrant pain: Secondary | ICD-10-CM | POA: Diagnosis not present

## 2022-05-03 DIAGNOSIS — H401133 Primary open-angle glaucoma, bilateral, severe stage: Secondary | ICD-10-CM | POA: Diagnosis not present

## 2022-05-03 DIAGNOSIS — H6122 Impacted cerumen, left ear: Secondary | ICD-10-CM | POA: Diagnosis not present

## 2022-05-22 IMAGING — MG MM DIGITAL SCREENING BILAT W/ TOMO AND CAD
8 series · 8 of 24 positions shown · non-contrast
Comparison: Previous exam(s).

CLINICAL DATA: Screening.

EXAM:
DIGITAL SCREENING BILATERAL MAMMOGRAM WITH TOMOSYNTHESIS AND CAD
TECHNIQUE: Bilateral screening digital craniocaudal and mediolateral oblique
mammograms were obtained. Bilateral screening digital breast
tomosynthesis was performed. The images were evaluated with
computer-aided detection.

[L MLO synth-2D]
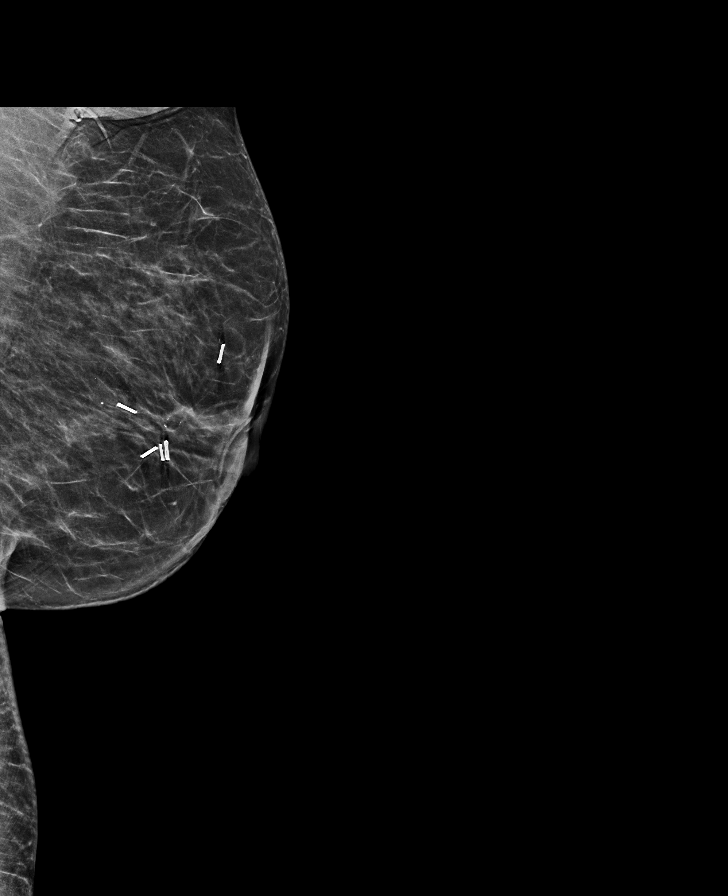

[R CC synth-2D]
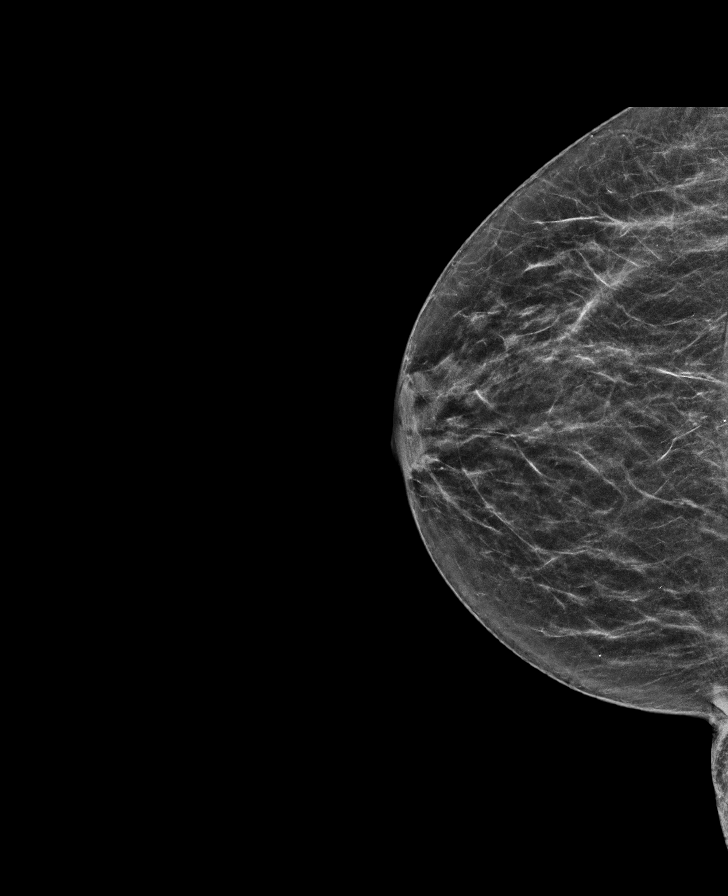

[R MLO synth-2D]
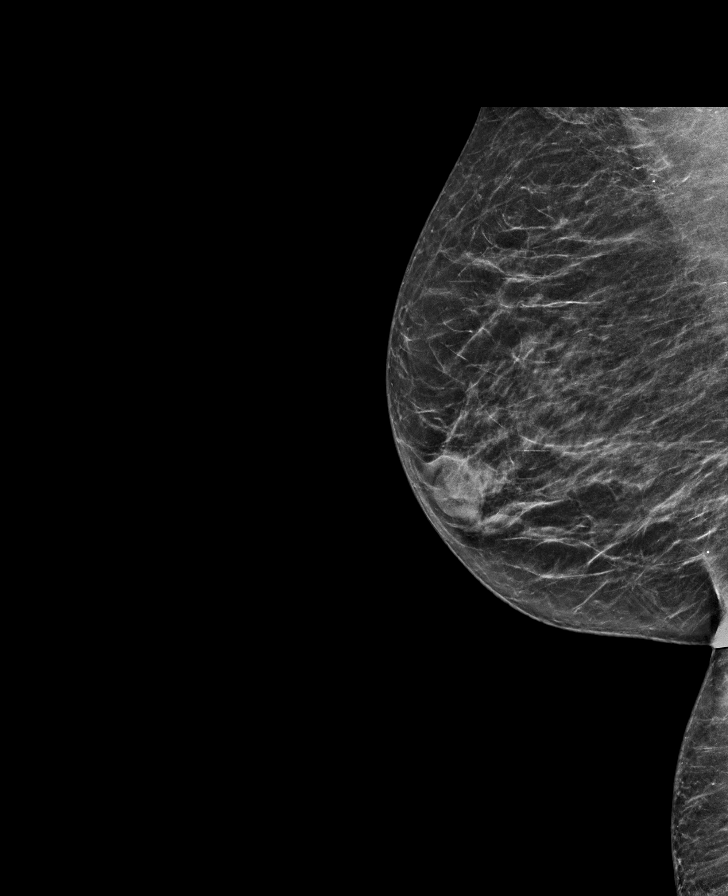

[L CC synth-2D]
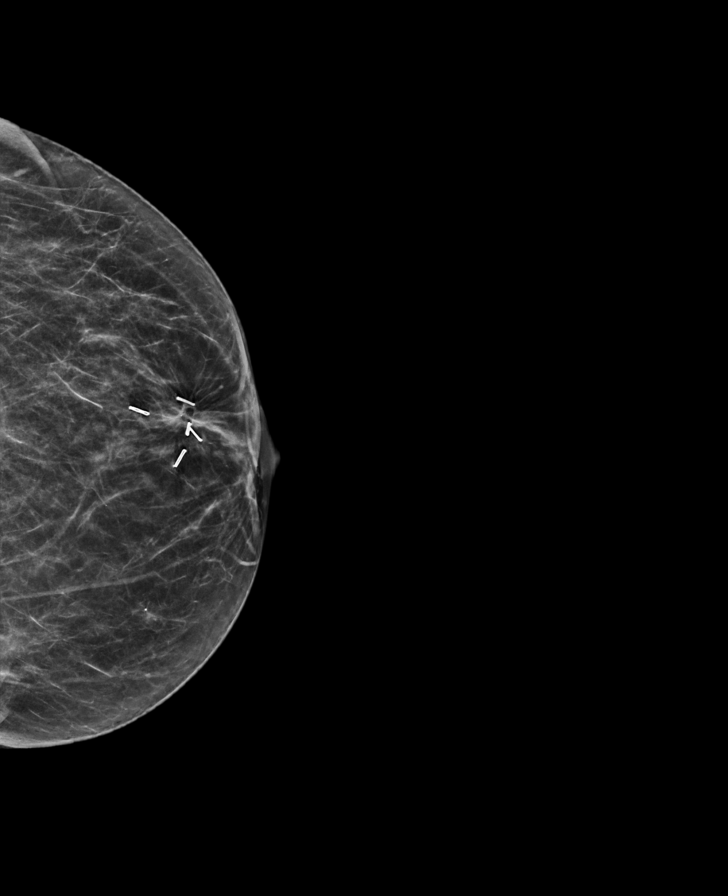

[R CC tomo · tomo slice 28/55.0]
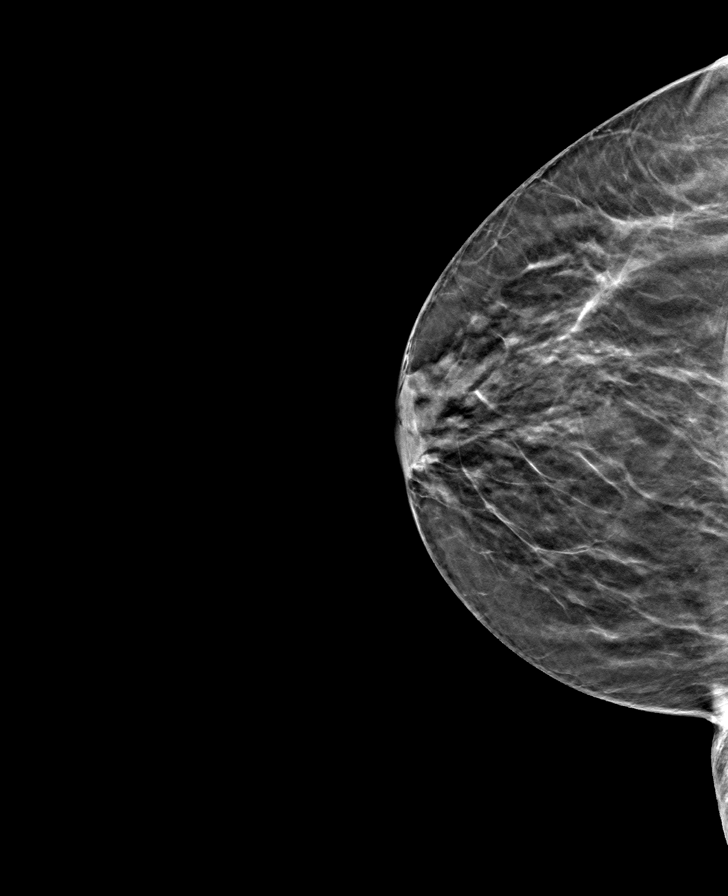

[R MLO tomo · tomo slice 29/58.0]
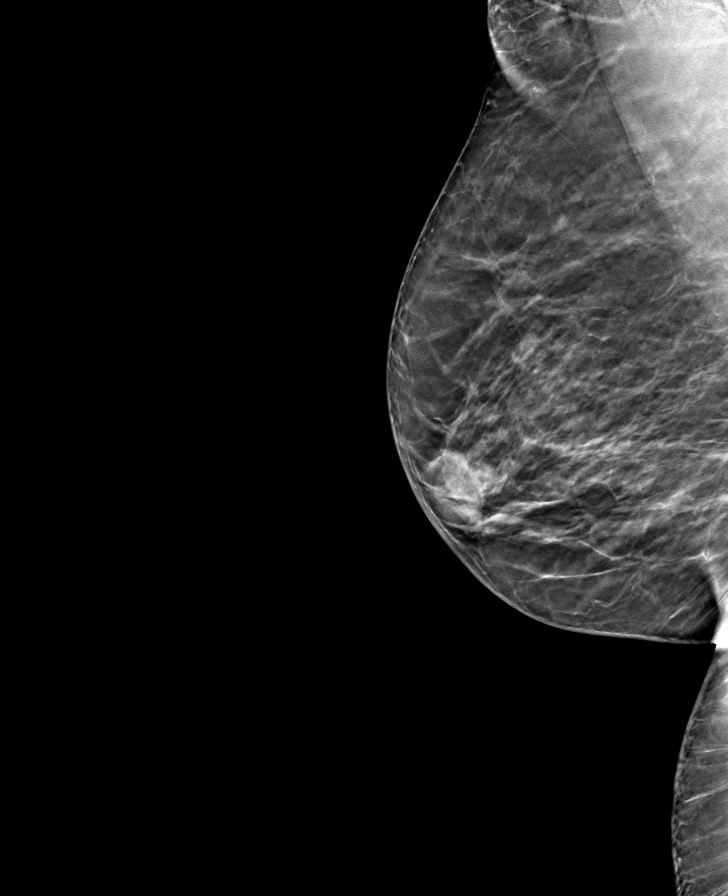

[L CC tomo · tomo slice 28/55.0]
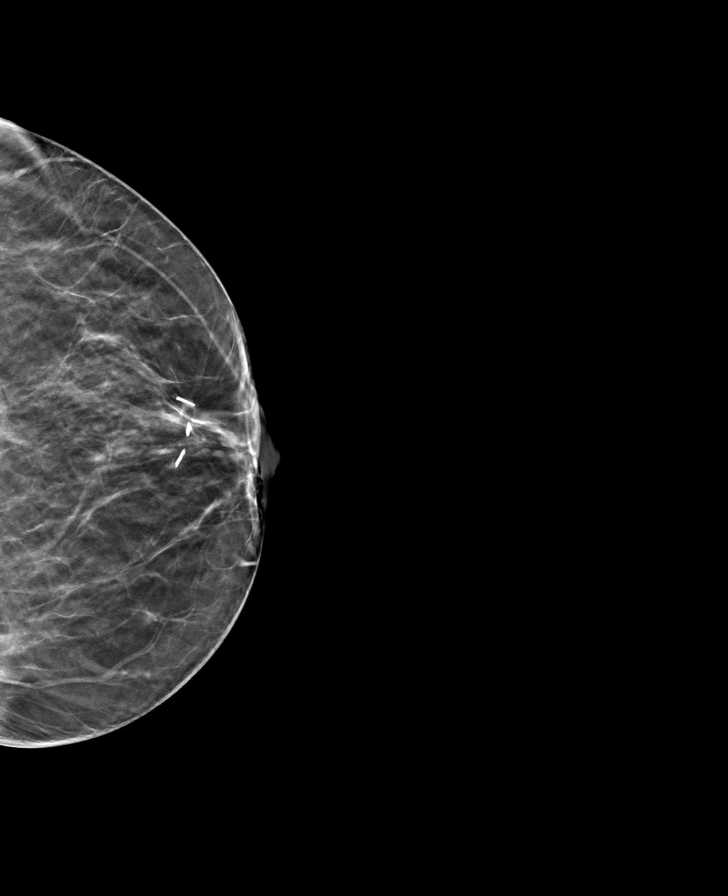

[L MLO tomo · tomo slice 30/59.0]
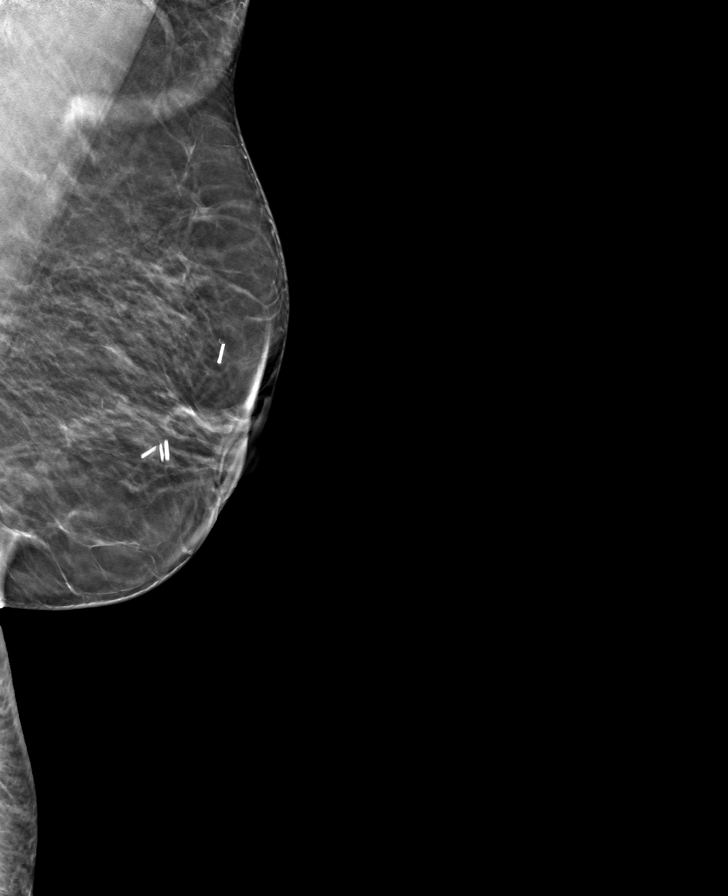

[8 of 24 positions shown; findings below may reference images not displayed]

ACR Breast Density Category b: There are scattered areas of
fibroglandular density.
FINDINGS: There are no findings suspicious for malignancy.
IMPRESSION: No mammographic evidence of malignancy. A result letter of this
screening mammogram will be mailed directly to the patient.

RECOMMENDATION:
Screening mammogram in one year. (Code:51-O-LD2)

BI-RADS CATEGORY  1: Negative.

## 2022-06-09 NOTE — Progress Notes (Signed)
Patient Care Team: Vernie Shanks, MD (Inactive) as PCP - General (Family Medicine) Nicholas Lose, MD as Consulting Physician (Hematology and Oncology) Delice Bison, Charlestine Massed, NP as Nurse Practitioner (Hematology and Oncology) Kyung Rudd, MD as Consulting Physician (Radiation Oncology) Donnie Mesa, MD as Consulting Physician (General Surgery)  DIAGNOSIS: No diagnosis found.  SUMMARY OF ONCOLOGIC HISTORY: Oncology History  Carcinoma of upper-outer quadrant of left breast in female, estrogen receptor positive ([place])  03/10/2017 Initial Diagnosis   Invasive lobular cancer grade 2, ER 100%, PR 100%, HER-2 negative ratio 1.51, Ki-67 15%   03/23/2017 Breast MRI   Irregular mass in the retroareolar left breast 1.7 x 1 x 1.3 cm, no abnormal lymph nodes, T1 CN 0 stage IA   04/12/2017 Surgery   Left lumpectomy: Fibrocystic changes; superior margin: Invasive pleomorphic lobular carcinoma grade 3, 1.5 cm, perineural invasion and lymphovascular invasion present, pleomorphic LCIS, margins negative, 0/5 lymph nodes negative, ER 100%, PR 100%, HER-2 negative, Ki-67 15% T1 CN 0 stage IA   04/25/2017 Oncotype testing   Oncotype DX score 19: 12% risk of recurrence with tamoxifen alone over 10 years   05/23/2017 - 06/20/2017 Radiation Therapy   Adjuvant radiation therapy   06/27/2017 -  Anti-estrogen oral therapy   Anastrozole daily     CHIEF COMPLIANT:  Follow-up of left breast cancer on anastrozole    INTERVAL HISTORY: Breanna Pratt is a 66 y.o. with above-mentioned history of left breast cancer treated with lumpectomy, radiation, and who is currently on antiestrogen therapy with anastrozole.     ALLERGIES:  is allergic to altace [ramipril] and mobic [meloxicam].  MEDICATIONS:  Current Outpatient Medications  Medication Sig Dispense Refill   anastrozole (ARIMIDEX) 1 MG tablet Take 1 tablet (1 mg total) by mouth daily. 90 tablet 3   BD PEN NEEDLE NANO U/F 32G X 4 MM MISC      diclofenac  Sodium (VOLTAREN) 1 % GEL      famotidine (PEPCID) 20 MG tablet Take 1 tablet (20 mg total) by mouth daily. 30 tablet 0   gabapentin (NEURONTIN) 300 MG capsule Take 300 mg by mouth 3 (three) times daily.      insulin lispro (HUMALOG) 100 UNIT/ML injection Inject 8-11 Units into the skin 3 (three) times daily before meals. Per sliding scale     latanoprost (XALATAN) 0.005 % ophthalmic solution Place 1 drop into both eyes at bedtime.     ONE TOUCH ULTRA TEST test strip      promethazine (PHENERGAN) 25 MG tablet Take 1 tablet (25 mg total) by mouth 2 (two) times daily as needed for nausea or vomiting. 30 tablet 3   rizatriptan (MAXALT-MLT) 10 MG disintegrating tablet Take 1 tablet (10 mg total) by mouth as needed for migraine. May repeat in 2 hours if needed 9 tablet 11   sitaGLIPtan-metformin (JANUMET) 50-1000 MG per tablet Take 1 tablet by mouth daily.      topiramate (TOPAMAX) 50 MG tablet Take 1 tablet (50 mg total) by mouth 2 (two) times daily. 60 tablet 12   No current facility-administered medications for this visit.    PHYSICAL EXAMINATION: ECOG PERFORMANCE STATUS: {CHL ONC ECOG PS:[phone]}  There were no vitals filed for this visit. There were no vitals filed for this visit.  BREAST:*** No palpable masses or nodules in either right or left breasts. No palpable axillary supraclavicular or infraclavicular adenopathy no breast tenderness or nipple discharge. (exam performed in the presence of a chaperone)  LABORATORY DATA:  I  have reviewed the data as listed    Latest Ref Rng & Units 09/18/2019    8:14 PM 04/11/2019    8:47 AM 04/07/2017    9:18 AM  CMP  Glucose 70 - 99 mg/dL 427  319  292   BUN 8 - 23 mg/dL _0 Creatinine 0.44 - 1.00 mg/dL 1.11  1.06  0.75   Sodium 135 - 145 mmol/L 137  135  136   Potassium 3.5 - 5.1 mmol/L 4.2  4.5  4.7   Chloride 98 - 111 mmol/L 102  102  103   CO2 22 - 32 mmol/L _1 Calcium 8.9 - 10.3 mg/dL 9.3  10.1  9.0   Total Protein  6.5 - 8.1 g/dL 8.0     Total Bilirubin 0.3 - 1.2 mg/dL 1.5     Alkaline Phos 38 - 126 U/L 101     AST 15 - 41 U/L 26     ALT 0 - 44 U/L 34       Lab Results  Component Value Date   WBC 12.7 (H) 09/18/2019   HGB 12.4 09/18/2019   HCT 37.0 09/18/2019   MCV 103.4 (H) 09/18/2019   PLT 241 09/18/2019   NEUTROABS 10.2 (H) 09/18/2019    ASSESSMENT & PLAN:  No problem-specific Assessment & Plan notes found for this encounter.    No orders of the defined types were placed in this encounter.  The patient has a good understanding of the overall plan. she agrees with it. she will call with any problems that may develop before the next visit here. Total time spent: 30 mins including face to face time and time spent for planning, charting and co-ordination of care   Suzzette Righter, [place] 06/09/22    I Gardiner Coins am scribing for Dr. Lindi Adie  ***

## 2022-06-16 ENCOUNTER — Other Ambulatory Visit: Payer: Self-pay

## 2022-06-16 ENCOUNTER — Inpatient Hospital Stay: Payer: BC Managed Care – PPO | Attending: Hematology and Oncology | Admitting: Hematology and Oncology

## 2022-06-16 VITALS — BP 122/88 | HR 72 | Temp 97.3°F | Resp 18 | Ht 62.0 in | Wt 127.8 lb

## 2022-06-16 DIAGNOSIS — C50412 Malignant neoplasm of upper-outer quadrant of left female breast: Secondary | ICD-10-CM | POA: Diagnosis not present

## 2022-06-16 DIAGNOSIS — M8580 Other specified disorders of bone density and structure, unspecified site: Secondary | ICD-10-CM | POA: Diagnosis not present

## 2022-06-16 DIAGNOSIS — Z170 Estrogen receptor positive status [ER+]: Secondary | ICD-10-CM | POA: Diagnosis not present

## 2022-06-16 DIAGNOSIS — Z79811 Long term (current) use of aromatase inhibitors: Secondary | ICD-10-CM | POA: Diagnosis not present

## 2022-06-16 MED ORDER — ANASTROZOLE 1 MG PO TABS: 1.0000 mg | ORAL_TABLET | Freq: Every day | ORAL | 3 refills | Status: AC

## 2022-06-16 NOTE — Assessment & Plan Note (Signed)
04/12/2017 Left lumpectomy: Fibrocystic changes; superior margin: Invasive pleomorphic lobular carcinoma grade 3, 1.5 cm, perineural invasion and lymphovascular invasion present, pleomorphic LCIS, margins negative, 0/5 lymph nodes negative, ER 100%, PR 100%, HER-2 negative, Ki-67 15% T1 CN 0 stage IA   Oncotype DX score 19: 12% risk of recurrence with tamoxifen alone over 10 years   Adjuvant radiation therapy 05/23/2017- 06/20/2017   Treatment plan: Adjuvant antiestrogen therapy with anastrozole 1 mg p.o. daily times 5-7 years started November 2018   Anastrozole toxicities: Denies any hot flashes.  She does have joint and muscle stiffness.  She also has a trigger thumb and carpal tunnel syndrome   Breast cancer surveillance:  08/13/2021: Mammogram:: Benign breast density category B 06/16/2022: Breast Exam: benign 11/30/2021: Bone density: T score -1.7: Osteopenia   Return to clinic in 1 year for follow-up

## 2022-06-17 ENCOUNTER — Telehealth: Payer: Self-pay | Admitting: Hematology and Oncology

## 2022-06-17 NOTE — Telephone Encounter (Signed)
Scheduled appointment per 11/8 los. Left voicemail.

## 2022-07-16 ENCOUNTER — Other Ambulatory Visit: Payer: Self-pay | Admitting: Surgery

## 2022-07-16 DIAGNOSIS — Z1231 Encounter for screening mammogram for malignant neoplasm of breast: Secondary | ICD-10-CM

## 2022-09-01 ENCOUNTER — Ambulatory Visit: Payer: BC Managed Care – PPO | Admitting: Internal Medicine

## 2022-09-01 ENCOUNTER — Encounter: Payer: Self-pay | Admitting: Internal Medicine

## 2022-09-01 VITALS — BP 113/56 | HR 61 | Ht 62.0 in | Wt 132.6 lb

## 2022-09-01 DIAGNOSIS — E119 Type 2 diabetes mellitus without complications: Secondary | ICD-10-CM

## 2022-09-01 DIAGNOSIS — I10 Essential (primary) hypertension: Secondary | ICD-10-CM

## 2022-09-01 DIAGNOSIS — Z794 Long term (current) use of insulin: Secondary | ICD-10-CM

## 2022-09-01 DIAGNOSIS — R079 Chest pain, unspecified: Secondary | ICD-10-CM

## 2022-09-01 DIAGNOSIS — R0609 Other forms of dyspnea: Secondary | ICD-10-CM

## 2022-09-01 NOTE — Progress Notes (Signed)
Primary Physician/Referring:  Lurline Del, DO  Patient ID: Breanna Pratt, female    DOB: 06/12/1956, 67 y.o.   MRN: [id number]  Chief Complaint  Patient presents with   Chest Pain   New Patient (Initial Visit)   HPI:    Breanna Pratt  is a 67 y.o. female with past medical history significant for diabetes who is here to establish care with cardiology due to chest pain and dyspnea on exertion.  For a few weeks now patient has been feeling winded when she is going up the stairs.  She is not sure if this was because she was sick recently or something else.  She has never had chest pressure or shortness of breath when going up the stairs at her home previously.  Generally she does not have chest pain or shortness of breath when she is pushing a shopping cart or doing housework.  Given her age and diabetes, patient is at least moderate risk for heart disease.  She denies palpitations, diaphoresis, syncope, claudication, edema, orthopnea, PND.  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   B12 deficiency    Breast cancer ([place]) 05/2017   stg 1, radiation therapy   Chronic back pain    Diabetes mellitus    takes Metformin daily;takes Humalog takes  8in am .12u at lunch and 15 at dinner.Lantus 35units at bedtime;;Average fasting sugars 100-120   Eczema    GERD (gastroesophageal reflux disease)    Prevacid prn   Glaucoma    Hemorrhoids    Herpes    Migraine    Osteopenia    Paresthesias    hands   Peripheral neuropathy    takes Gabapentin   Personal history of radiation therapy    Past Surgical History:  Procedure Laterality Date   ABDOMINAL HYSTERECTOMY  10+yrs ago   BREAST LUMPECTOMY WITH RADIOACTIVE SEED AND SENTINEL LYMPH NODE BIOPSY Left 04/12/2017   Procedure: LEFT BREAST LUMPECTOMY WITH RADIOACTIVE SEED AND SENTINEL LYMPH NODE BIOPSY WITH BLUE DYE INJECTION;  Surgeon: Donnie Mesa, MD;  Location: [facility];  Service: General;  Laterality:  Left;   COLONOSCOPY  2010   CYSTOSCOPY     as a teenager   DILATION AND CURETTAGE OF UTERUS  30+yrs ago   x 2   SHOULDER ARTHROSCOPY WITH ROTATOR CUFF REPAIR Bilateral    TRIGGER FINGER RELEASE  03/2019   TUBAL LIGATION     Family History  Problem Relation Age of Onset   Other Mother        brain tumor, ovarian cancer, blood, bone cancer   Other Father        gsw complications   Diabetes Father    Other Maternal Grandfather        brain tumor   Anesthesia problems Neg Hx    Hypotension Neg Hx    Malignant hyperthermia Neg Hx    Pseudochol deficiency Neg Hx    Breast cancer Neg Hx     Social History   Tobacco Use   Smoking status: Never   Smokeless tobacco: Never  Substance Use Topics   Alcohol use: No   Marital Status: Single  ROS  Review of Systems  Cardiovascular:  Positive for chest pain and dyspnea on exertion.   Objective  Blood pressure (!) 113/56, pulse 61, height '5\' 2"'$  (1.575 m), weight 132 lb 9.6 oz (60.1 kg), SpO2 96 %. Body mass index is 24.25 kg/m.  09/01/2022   10:46 AM 06/16/2022    8:11 AM 06/17/2021    8:16 AM  Vitals with BMI  Height '5\' 2"'$  '5\' 2"'$  '5\' 2"'$   Weight 132 lbs 10 oz 127 lbs 13 oz 125 lbs 10 oz  BMI 24.25 01.02 72.53  Systolic 664 403 474  Diastolic 56 88 54  Pulse 61 72 75     Physical Exam Vitals reviewed.  HENT:     Head: Normocephalic and atraumatic.  Cardiovascular:     Rate and Rhythm: Normal rate and regular rhythm.     Pulses: Normal pulses.     Heart sounds: Normal heart sounds. No murmur heard. Pulmonary:     Effort: Pulmonary effort is normal.     Breath sounds: Normal breath sounds.  Abdominal:     General: Bowel sounds are normal.  Musculoskeletal:     Right lower leg: No edema.     Left lower leg: No edema.  Skin:    General: Skin is warm and dry.  Neurological:     Mental Status: She is alert.     Medications and allergies   Allergies  Allergen Reactions   Ace Inhibitors Swelling   Altace  [Ramipril] Other (See Comments)    angioedema   Mobic [Meloxicam] Other (See Comments)    ineffective     Medication list after today's encounter   Current Outpatient Medications:    anastrozole (ARIMIDEX) 1 MG tablet, Take 1 tablet (1 mg total) by mouth daily., Disp: 90 tablet, Rfl: 3   BD PEN NEEDLE NANO U/F 32G X 4 MM MISC, , Disp: , Rfl:    cholecalciferol (VITAMIN D3) 25 MCG (1000 UNIT) tablet, Take 1,000 Units by mouth daily., Disp: , Rfl:    clobetasol cream (TEMOVATE) 0.05 %, APPLY TO AFFECTED AREA UP TO TWICE DAILY AS NEEDED *NO ON FACR, GROIN, OR UNDER ARMS*, Disp: , Rfl:    Continuous Blood Gluc Sensor (DEXCOM G6 SENSOR) MISC, SMARTSIG:Topical Every 10 Days, Disp: , Rfl:    Continuous Blood Gluc Transmit (DEXCOM G6 TRANSMITTER) MISC, See admin instructions., Disp: , Rfl:    HUMALOG 100 UNIT/ML injection, INJECT 40 UNITS SUBCUTANEOUSLY VIA PUMP, Disp: , Rfl:    ibuprofen (ADVIL) 200 MG tablet, Take 200 mg by mouth every 6 (six) hours as needed., Disp: , Rfl:    Insulin Disposable Pump (OMNIPOD 5 G6 POD, GEN 5,) MISC, Take by mouth every other day., Disp: , Rfl:    insulin lispro (HUMALOG) 100 UNIT/ML injection, Inject 8-11 Units into the skin 3 (three) times daily before meals. Per sliding scale, Disp: , Rfl:    ONE TOUCH ULTRA TEST test strip, , Disp: , Rfl:    pregabalin (LYRICA) 100 MG capsule, TAKE 1 CAPSULE BY MOUTH THREE TIMES A DAY, Disp: , Rfl:    sitaGLIPtan-metformin (JANUMET) 50-1000 MG per tablet, Take 1 tablet by mouth daily. , Disp: , Rfl:    vitamin B-12 (CYANOCOBALAMIN) 100 MCG tablet, Take 100 mcg by mouth daily., Disp: , Rfl:   Laboratory examination:   Lab Results  Component Value Date   NA 137 09/18/2019   K 4.2 09/18/2019   CO2 21 (L) 09/18/2019   GLUCOSE 427 (H) 09/18/2019   BUN 23 09/18/2019   CREATININE 1.11 (H) 09/18/2019   CALCIUM 9.3 09/18/2019   GFRNONAA 53 (L) 09/18/2019       Latest Ref Rng & Units 09/18/2019    8:14 PM 04/11/2019    8:47  AM 04/07/2017  9:18 AM  CMP  Glucose 70 - 99 mg/dL 427  319  292   BUN 8 - 23 mg/dL '23  19  15   '$ Creatinine 0.44 - 1.00 mg/dL 1.11  1.06  0.75   Sodium 135 - 145 mmol/L 137  135  136   Potassium 3.5 - 5.1 mmol/L 4.2  4.5  4.7   Chloride 98 - 111 mmol/L 102  102  103   CO2 22 - 32 mmol/L '21  22  26   '$ Calcium 8.9 - 10.3 mg/dL 9.3  10.1  9.0   Total Protein 6.5 - 8.1 g/dL 8.0     Total Bilirubin 0.3 - 1.2 mg/dL 1.5     Alkaline Phos 38 - 126 U/L 101     AST 15 - 41 U/L 26     ALT 0 - 44 U/L 34         Latest Ref Rng & Units 09/18/2019    8:14 PM 04/11/2019    8:47 AM 10/15/2011    2:53 AM  CBC  WBC 4.0 - 10.5 K/uL 12.7  7.4    Hemoglobin 12.0 - 15.0 g/dL 12.4  13.6  13.3   Hematocrit 36.0 - 46.0 % 37.0  40.6  39.0   Platelets 150 - 400 K/uL 241  261      Lipid Panel No results for input(s): "CHOL", "TRIG", "[place]", "VLDL", "HDL", "CHOLHDL", "LDLDIRECT" in the last 8760 hours.  HEMOGLOBIN A1C No results found for: "HGBA1C", "MPG" TSH No results for input(s): "TSH" in the last 8760 hours.  External labs:   Lab # Date A1C 6.3% 06/09/2022 BUN 13.000 03/11/2021 Chlamydia N/D Cholesterol 174.000 07/07/2022 Creatinine 0.790 03/11/2021 eGFR 83.000 03/11/2021 Glucose, Rand 250 mg 03/24/2022 HDL 95.000 07/07/2022 Hemoglobin 12.800 03/11/2021 INR N/D LDL-C 64.000 07/07/2022 MicroAlbumin 0.870 09/08/2020 Potassium 4.600 03/11/2021 Triglycerides 81.000 07/07/2022 TSH 1.120 09/08/2020 Urinalysis 1.025 03/24/2022  Radiology:    Cardiac Studies:     EKG:   09/01/2022: Sinus Bradycardia  with sinus arrhythmia -Negative precordial T-waves  Assessment     ICD-10-CM   1. Chest pain of uncertain etiology  Y07.3 EKG 12-Lead    PCV ECHOCARDIOGRAM COMPLETE    PCV MYOCARDIAL PERFUSION WO LEXISCAN    2. DOE (dyspnea on exertion)  R06.09 PCV ECHOCARDIOGRAM COMPLETE    PCV MYOCARDIAL PERFUSION WO LEXISCAN    3. Chest pain on exertion  R07.9 PCV ECHOCARDIOGRAM COMPLETE    PCV MYOCARDIAL  PERFUSION WO LEXISCAN    4. Essential hypertension  I10 PCV ECHOCARDIOGRAM COMPLETE    PCV MYOCARDIAL PERFUSION WO LEXISCAN    5. Type 2 diabetes mellitus without complication, with long-term current use of insulin (HCC)  E11.9 PCV ECHOCARDIOGRAM COMPLETE   Z79.4 PCV MYOCARDIAL PERFUSION WO LEXISCAN       Orders Placed This Encounter  Procedures   PCV MYOCARDIAL PERFUSION WO LEXISCAN    Standing Status:   Future    Standing Expiration Date:   10/31/2022   EKG 12-Lead   PCV ECHOCARDIOGRAM COMPLETE    Standing Status:   Future    Standing Expiration Date:   09/02/2023    No orders of the defined types were placed in this encounter.   Medications Discontinued During This Encounter  Medication Reason   diclofenac Sodium (VOLTAREN) 1 % GEL Completed Course   famotidine (PEPCID) 20 MG tablet Completed Course   latanoprost (XALATAN) 0.005 % ophthalmic solution Completed Course   neomycin-polymyxin-hydrocortisone (CORTISPORIN) 3.5-10000-1 OTIC suspension Completed Course  promethazine (PHENERGAN) 25 MG tablet Completed Course   topiramate (TOPAMAX) 50 MG tablet Completed Course   SitaGLIPtin-MetFORMIN HCl (JANUMET XR) 50-1000 MG TB24 Completed Course   triamcinolone cream (KENALOG) 0.1 %    rizatriptan (MAXALT-MLT) 10 MG disintegrating tablet Completed Course   traMADol (ULTRAM) 50 MG tablet Completed Course   gabapentin (NEURONTIN) 300 MG capsule Completed Course     Recommendations:   Breanna Pratt is a 67 y.o.  female with chest pain and dyspnea on exertion   Chest pain on exertion+ DOE (dyspnea on exertion) Echocardiogram and stress test ordered Patient instructed not to do heavy lifting, heavy exertional activity, swimming until evaluation is complete.  Patient instructed to call if symptoms worse or to go to the ED for further evaluation.   Essential hypertension Controlled without medication Encourage low-sodium diet, less than 2000 mg daily. Follow-up in 1-2 months or  sooner if needed.   Type 2 diabetes mellitus without complication, with long-term current use of insulin [facility]) Primary managing     Floydene Flock, DO, [facility]  09/01/2022, 12:31 PM Office: [phone] Pager: [phone]

## 2022-09-08 ENCOUNTER — Ambulatory Visit: Payer: BC Managed Care – PPO

## 2022-09-08 DIAGNOSIS — R0609 Other forms of dyspnea: Secondary | ICD-10-CM

## 2022-09-08 DIAGNOSIS — E119 Type 2 diabetes mellitus without complications: Secondary | ICD-10-CM

## 2022-09-08 DIAGNOSIS — I10 Essential (primary) hypertension: Secondary | ICD-10-CM

## 2022-09-08 DIAGNOSIS — R079 Chest pain, unspecified: Secondary | ICD-10-CM

## 2022-09-09 ENCOUNTER — Ambulatory Visit: Payer: BC Managed Care – PPO

## 2022-09-12 NOTE — Progress Notes (Signed)
normal

## 2022-09-13 NOTE — Progress Notes (Signed)
Called patient, left  echocardiogram results on VM.

## 2022-09-20 ENCOUNTER — Ambulatory Visit: Payer: BC Managed Care – PPO

## 2022-09-20 DIAGNOSIS — R079 Chest pain, unspecified: Secondary | ICD-10-CM

## 2022-09-20 DIAGNOSIS — I10 Essential (primary) hypertension: Secondary | ICD-10-CM

## 2022-09-20 DIAGNOSIS — E119 Type 2 diabetes mellitus without complications: Secondary | ICD-10-CM

## 2022-09-20 DIAGNOSIS — R0609 Other forms of dyspnea: Secondary | ICD-10-CM

## 2022-09-20 NOTE — Progress Notes (Signed)
normal

## 2022-09-21 ENCOUNTER — Other Ambulatory Visit: Payer: BC Managed Care – PPO

## 2022-09-21 NOTE — Progress Notes (Signed)
Pt aware.

## 2022-09-23 ENCOUNTER — Encounter (HOSPITAL_BASED_OUTPATIENT_CLINIC_OR_DEPARTMENT_OTHER): Payer: Self-pay | Admitting: Orthopedic Surgery

## 2022-09-23 NOTE — Progress Notes (Signed)
   09/23/22 1342  PAT Phone Screen  Is the patient taking a GLP-1 receptor agonist? No  Do You Have Diabetes? Yes  Do You Have Hypertension? No  Have You Ever Been to the ER for Asthma? No  Have You Taken Oral Steroids in the Past 3 Months? No  Do you Take Phenteramine or any Other Diet Drugs? No  Recent  Lab Work, EKG, CXR? Yes  Do you have a history of heart problems? Yes  Cardiologist Name Dr Enzo Bi  Have you ever had tests on your heart? Yes  What cardiac tests were performed? EKG;Echo;Other (comment) (lexiscan)  What date/year were cardiac tests completed? 2024  Results viewable: [facility] Media Tab  Any Recent Hospitalizations? No  Height '5\' 3"'$  (1.6 m)  Weight 60.8 kg  Pat Appointment Scheduled Yes ([facility])   Reviewed cardiology notes with Dr. Roanna Banning at [facility]. Ok to proceed with surgery without further clearance needed

## 2022-09-27 ENCOUNTER — Encounter (HOSPITAL_BASED_OUTPATIENT_CLINIC_OR_DEPARTMENT_OTHER)
Admission: RE | Admit: 2022-09-27 | Discharge: 2022-09-27 | Disposition: A | Discharge status: Home / Self Care | Payer: BC Managed Care – PPO | Source: Ambulatory Visit | Attending: Orthopedic Surgery | Admitting: Orthopedic Surgery

## 2022-09-27 DIAGNOSIS — M65331 Trigger finger, right middle finger: Secondary | ICD-10-CM | POA: Diagnosis not present

## 2022-09-27 DIAGNOSIS — Z794 Long term (current) use of insulin: Secondary | ICD-10-CM | POA: Diagnosis not present

## 2022-09-27 DIAGNOSIS — E119 Type 2 diabetes mellitus without complications: Secondary | ICD-10-CM | POA: Diagnosis not present

## 2022-09-27 DIAGNOSIS — Z01818 Encounter for other preprocedural examination: Secondary | ICD-10-CM | POA: Insufficient documentation

## 2022-09-27 LAB — BASIC METABOLIC PANEL
Anion gap: 11 (ref 5–15)
BUN: 16 mg/dL (ref 8–23)
CO2: 23 mmol/L (ref 22–32)
Calcium: 9.1 mg/dL (ref 8.9–10.3)
Chloride: 102 mmol/L (ref 98–111)
Creatinine, Ser: 0.89 mg/dL (ref 0.44–1.00)
GFR, Estimated: >60 mL/min (ref >60)
Glucose, Bld: 155 mg/dL — ABNORMAL HIGH (ref 70–99)
Potassium: 3.8 mmol/L (ref 3.5–5.1)
Sodium: 136 mmol/L (ref 135–145)

## 2022-09-27 NOTE — Progress Notes (Signed)

## 2022-09-29 ENCOUNTER — Ambulatory Visit (HOSPITAL_BASED_OUTPATIENT_CLINIC_OR_DEPARTMENT_OTHER)
Admission: RE | Admit: 2022-09-29 | Discharge: 2022-09-29 | Disposition: A | Discharge status: Home / Self Care | Payer: BC Managed Care – PPO | Attending: Orthopedic Surgery | Admitting: Orthopedic Surgery

## 2022-09-29 ENCOUNTER — Ambulatory Visit (HOSPITAL_BASED_OUTPATIENT_CLINIC_OR_DEPARTMENT_OTHER): Payer: BC Managed Care – PPO | Admitting: Certified Registered"

## 2022-09-29 ENCOUNTER — Encounter (HOSPITAL_BASED_OUTPATIENT_CLINIC_OR_DEPARTMENT_OTHER): Payer: Self-pay | Admitting: Orthopedic Surgery

## 2022-09-29 ENCOUNTER — Encounter (HOSPITAL_BASED_OUTPATIENT_CLINIC_OR_DEPARTMENT_OTHER)
Admission: RE | Disposition: A | Discharge status: Home / Self Care | Payer: Self-pay | Source: Home / Self Care | Attending: Orthopedic Surgery

## 2022-09-29 ENCOUNTER — Other Ambulatory Visit: Payer: Self-pay

## 2022-09-29 DIAGNOSIS — M65331 Trigger finger, right middle finger: Secondary | ICD-10-CM | POA: Insufficient documentation

## 2022-09-29 DIAGNOSIS — Z794 Long term (current) use of insulin: Secondary | ICD-10-CM | POA: Insufficient documentation

## 2022-09-29 DIAGNOSIS — E119 Type 2 diabetes mellitus without complications: Secondary | ICD-10-CM | POA: Insufficient documentation

## 2022-09-29 DIAGNOSIS — Z01818 Encounter for other preprocedural examination: Secondary | ICD-10-CM

## 2022-09-29 HISTORY — PX: TRIGGER FINGER RELEASE: SHX641

## 2022-09-29 LAB — GLUCOSE, CAPILLARY
Glucose-Capillary: 232 mg/dL — ABNORMAL HIGH (ref 70–99)
Glucose-Capillary: 227 mg/dL — ABNORMAL HIGH (ref 70–99)

## 2022-09-29 SURGERY — RELEASE, A1 PULLEY, FOR TRIGGER FINGER
Anesthesia: Monitor Anesthesia Care | Site: Finger | Laterality: Right

## 2022-09-29 MED ORDER — ONDANSETRON HCL 4 MG/2ML IJ SOLN: 4.0000 mg | Freq: Once | INTRAMUSCULAR | Status: DC | PRN

## 2022-09-29 MED ORDER — ONDANSETRON HCL 4 MG/2ML IJ SOLN
INTRAMUSCULAR | Status: AC
  Filled 2022-09-29: qty 2

## 2022-09-29 MED ORDER — MIDAZOLAM HCL 2 MG/2ML IJ SOLN
INTRAMUSCULAR | Status: DC | PRN
  Administered 2022-09-29: 2 mg via INTRAVENOUS

## 2022-09-29 MED ORDER — ACETAMINOPHEN 500 MG PO TABS: 1000.0000 mg | ORAL_TABLET | Freq: Once | ORAL | Status: DC

## 2022-09-29 MED ORDER — ACETAMINOPHEN 10 MG/ML IV SOLN: 1000.0000 mg | Freq: Once | INTRAVENOUS | Status: DC | PRN

## 2022-09-29 MED ORDER — BUPIVACAINE HCL 0.25 % IJ SOLN
INTRAMUSCULAR | Status: DC | PRN
  Administered 2022-09-29: 8 mL

## 2022-09-29 MED ORDER — FENTANYL CITRATE (PF) 100 MCG/2ML IJ SOLN
INTRAMUSCULAR | Status: AC
  Filled 2022-09-29: qty 2

## 2022-09-29 MED ORDER — MIDAZOLAM HCL 2 MG/2ML IJ SOLN
INTRAMUSCULAR | Status: AC
  Filled 2022-09-29: qty 2

## 2022-09-29 MED ORDER — LACTATED RINGERS IV SOLN: INTRAVENOUS | Status: DC

## 2022-09-29 MED ORDER — BUPIVACAINE HCL (PF) 0.25 % IJ SOLN
INTRAMUSCULAR | Status: AC
  Filled 2022-09-29: qty 30

## 2022-09-29 MED ORDER — AMISULPRIDE (ANTIEMETIC) 5 MG/2ML IV SOLN: 10.0000 mg | Freq: Once | INTRAVENOUS | Status: DC | PRN

## 2022-09-29 MED ORDER — LIDOCAINE HCL (PF) 1 % IJ SOLN
INTRAMUSCULAR | Status: AC
  Filled 2022-09-29: qty 30

## 2022-09-29 MED ORDER — FENTANYL CITRATE (PF) 100 MCG/2ML IJ SOLN: 25.0000 ug | INTRAMUSCULAR | Status: DC | PRN

## 2022-09-29 MED ORDER — PROPOFOL 10 MG/ML IV BOLUS
INTRAVENOUS | Status: AC
  Filled 2022-09-29: qty 20

## 2022-09-29 MED ORDER — PROPOFOL 500 MG/50ML IV EMUL
INTRAVENOUS | Status: DC | PRN
  Administered 2022-09-29: 75 ug/kg/min via INTRAVENOUS

## 2022-09-29 MED ORDER — ONDANSETRON HCL 4 MG/2ML IJ SOLN
INTRAMUSCULAR | Status: DC | PRN
  Administered 2022-09-29: 4 mg via INTRAVENOUS

## 2022-09-29 MED ORDER — PROPOFOL 10 MG/ML IV BOLUS
INTRAVENOUS | Status: DC | PRN
  Administered 2022-09-29: 10 mg via INTRAVENOUS

## 2022-09-29 MED ORDER — ATROPINE SULFATE 0.4 MG/ML IV SOLN
INTRAVENOUS | Status: AC
  Filled 2022-09-29: qty 1

## 2022-09-29 MED ORDER — FENTANYL CITRATE (PF) 100 MCG/2ML IJ SOLN
INTRAMUSCULAR | Status: DC | PRN
  Administered 2022-09-29: 50 ug via INTRAVENOUS

## 2022-09-29 SURGICAL SUPPLY — 32 items
APL PRP STRL LF DISP 70% ISPRP (MISCELLANEOUS) ×1
BLADE SURG 15 STRL LF DISP TIS (BLADE) ×1 IMPLANT
BLADE SURG 15 STRL SS (BLADE) ×1
BNDG CMPR 9X4 STRL LF SNTH (GAUZE/BANDAGES/DRESSINGS) ×1
BNDG ELASTIC 2X5.8 VLCR STR LF (GAUZE/BANDAGES/DRESSINGS) ×1 IMPLANT
BNDG ESMARK 4X9 LF (GAUZE/BANDAGES/DRESSINGS) ×1 IMPLANT
CHLORAPREP W/TINT 26 (MISCELLANEOUS) ×1 IMPLANT
CORD BIPOLAR FORCEPS 12FT (ELECTRODE) ×1 IMPLANT
COVER BACK TABLE 60X90IN (DRAPES) ×1 IMPLANT
COVER MAYO STAND STRL (DRAPES) ×1 IMPLANT
CUFF TOURN SGL QUICK 18X4 (TOURNIQUET CUFF) IMPLANT
CUFF TOURN SGL QUICK 24 (TOURNIQUET CUFF)
CUFF TRNQT CYL 24X4X16.5-23 (TOURNIQUET CUFF) IMPLANT
DRAPE EXTREMITY T 121X128X90 (DISPOSABLE) ×1 IMPLANT
DRAPE SURG 17X23 STRL (DRAPES) ×1 IMPLANT
GAUZE SPONGE 4X4 12PLY STRL (GAUZE/BANDAGES/DRESSINGS) IMPLANT
GAUZE XEROFORM 1X8 LF (GAUZE/BANDAGES/DRESSINGS) ×1 IMPLANT
GLOVE BIO SURGEON STRL SZ7 (GLOVE) ×1 IMPLANT
GLOVE BIOGEL PI IND STRL 7.0 (GLOVE) ×1 IMPLANT
GOWN STRL REUS W/ TWL LRG LVL3 (GOWN DISPOSABLE) ×2 IMPLANT
GOWN STRL REUS W/TWL LRG LVL3 (GOWN DISPOSABLE) ×2
NDL HYPO 25X1 1.5 SAFETY (NEEDLE) ×1 IMPLANT
NEEDLE HYPO 25X1 1.5 SAFETY (NEEDLE) ×1 IMPLANT
NS IRRIG 1000ML POUR BTL (IV SOLUTION) ×1 IMPLANT
PACK BASIN DAY SURGERY FS (CUSTOM PROCEDURE TRAY) ×1 IMPLANT
SLEEVE SCD COMPRESS KNEE MED (STOCKING) IMPLANT
SUT ETHILON 4 0 PS 2 18 (SUTURE) ×1 IMPLANT
SUT VICRYL 4-0 PS2 18IN ABS (SUTURE) IMPLANT
SYR BULB EAR ULCER 3OZ GRN STR (SYRINGE) ×1 IMPLANT
SYR CONTROL 10ML LL (SYRINGE) ×1 IMPLANT
TOWEL GREEN STERILE FF (TOWEL DISPOSABLE) ×1 IMPLANT
UNDERPAD 30X36 HEAVY ABSORB (UNDERPADS AND DIAPERS) ×1 IMPLANT

## 2022-09-29 NOTE — Interval H&P Note (Signed)
History and Physical Interval Note:  09/29/2022 8:33 AM  Breanna Pratt  has presented today for surgery, with the diagnosis of Right middle trigger finger.  The various methods of treatment have been discussed with the patient and family. After consideration of risks, benefits and other options for treatment, the patient has consented to  Procedure(s) with comments: MIDDLE TRIGGER RELEASE (Right) - local 45 as a surgical intervention.  The patient's history has been reviewed, patient examined, no change in status, stable for surgery.  I have reviewed the patient's chart and labs.  Questions were answered to the patient's satisfaction.     Breanna Pratt

## 2022-09-29 NOTE — H&P (Signed)
HAND SURGERY   HPI: Patient is a 67 y.o. female who presents with right middle trigger finger that has failed conservative management.  Her symptoms are worsening and interfering with her daily activities.  Patient denies any changes to their medical history or new systemic symptoms today.    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   B12 deficiency    Breast cancer ([place]) 05/2017   stg 1, radiation therapy   Chronic back pain    Diabetes mellitus    takes Metformin daily;takes Humalog takes  8in am .12u at lunch and 15 at dinner.Lantus 35units at bedtime;;Average fasting sugars 100-120   Eczema    GERD (gastroesophageal reflux disease)    Prevacid prn   Glaucoma    Hemorrhoids    Herpes    Migraine    Osteopenia    Paresthesias    hands   Peripheral neuropathy    takes Gabapentin   Personal history of radiation therapy    Past Surgical History:  Procedure Laterality Date   ABDOMINAL HYSTERECTOMY  10+yrs ago   BREAST LUMPECTOMY WITH RADIOACTIVE SEED AND SENTINEL LYMPH NODE BIOPSY Left 04/12/2017   Procedure: LEFT BREAST LUMPECTOMY WITH RADIOACTIVE SEED AND SENTINEL LYMPH NODE BIOPSY WITH BLUE DYE INJECTION;  Surgeon: Donnie Mesa, MD;  Location: [facility];  Service: General;  Laterality: Left;   COLONOSCOPY  2010   CYSTOSCOPY     as a teenager   [place]  30+yrs ago   x 2   SHOULDER ARTHROSCOPY WITH ROTATOR CUFF REPAIR Bilateral    TRIGGER FINGER RELEASE  03/2019   TUBAL LIGATION     Social History   Socioeconomic History   Marital status: Single    Spouse name: Not on file   Number of children: 1   Years of education: Not on file   Highest education level: Associate degree: occupational, Hotel manager, or vocational program  Occupational History   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   Caffeine coffee 2-3 c daily   Social Determinants of Health   Financial Resource Strain: Not on file  Food Insecurity: Not on file  Transportation Needs: Not on file  Physical Activity: Not on file  Stress: Not on file  Social Connections: Not on file   Family History  Problem Relation Age of Onset   Other Mother        brain tumor, ovarian cancer, blood, bone cancer   Other Father        gsw complications   Diabetes Father    Other Maternal Grandfather        brain tumor   Anesthesia problems Neg Hx    Hypotension Neg Hx    Malignant hyperthermia Neg Hx    Pseudochol deficiency Neg Hx    Breast cancer Neg Hx    - negative except otherwise stated in the family history section Allergies  Allergen Reactions   Ace Inhibitors Swelling   Altace [Ramipril]  Other (See Comments)    angioedema   Mobic [Meloxicam] Other (See Comments)    ineffective   Prior to Admission medications   Medication Sig Start Date End Date Taking? Authorizing Provider  anastrozole (ARIMIDEX) 1 MG tablet Take 1 tablet (1 mg total) by mouth daily. 06/16/22  Yes Nicholas Lose, MD  cholecalciferol (VITAMIN D3) 25 MCG (1000 UNIT) tablet Take 1,000 Units by mouth daily.   Yes [provider]  clobetasol cream (TEMOVATE) 0.05 % APPLY TO AFFECTED AREA UP TO TWICE DAILY AS NEEDED *NO ON FACR, GROIN, OR UNDER ARMS*   Yes [provider]  HUMALOG 100 UNIT/ML injection INJECT 40 UNITS SUBCUTANEOUSLY VIA PUMP   Yes [provider]  pregabalin (LYRICA) 100 MG capsule TAKE 1 CAPSULE BY MOUTH THREE TIMES A DAY   Yes [provider]  sitaGLIPtan-metformin (JANUMET) 50-1000 MG per tablet Take 1 tablet by mouth daily.     Yes [provider]  vitamin B-12 (CYANOCOBALAMIN) 100 MCG tablet Take 100 mcg by mouth daily.   Yes [provider]  BD PEN NEEDLE NANO U/F 32G X 4 MM MISC  01/21/14   [provider]  Continuous Blood Gluc Sensor (DEXCOM G6 SENSOR) MISC SMARTSIG:Topical Every 10 Days 05/18/22   [provider]  Continuous Blood Gluc Transmit (DEXCOM G6 TRANSMITTER) MISC See admin instructions. 05/29/22   [provider]  Insulin Disposable Pump (OMNIPOD 5 G6 POD, GEN 5,) MISC Take by mouth every other day. 05/16/22   [provider]  insulin lispro (HUMALOG) 100 UNIT/ML injection Inject 8-11 Units into the skin 3 (three) times daily before meals. Per sliding scale    [provider]  ONE TOUCH ULTRA TEST test strip  02/20/14   [provider]  lisinopril (PRINIVIL,ZESTRIL) 2.5 MG tablet Take 2.5 mg by mouth daily.    10/15/11  [provider]  metFORMIN (GLUCOPHAGE) 500 MG tablet Take 500 mg by mouth daily.    10/15/11  [provider]   No results found. - Positive ROS: All other systems have been reviewed and were otherwise negative with the exception of those mentioned in the HPI and as above.  Physical Exam: General: No acute distress, resting comfortably Cardiovascular: BUE warm and well perfused, normal rate Respiratory: Normal WOB on RA Skin: Warm and dry Neurologic: Sensation intact distally Psychiatric: Patient is at baseline mood and affect  Right Hand   Palpable and visible triggering of middle finger.  TTP at A1 pulley with palpable nodule.  SILT m/u/r distribution.  Hand warm and well perfused   Assessment: Right middle trigger finger  Plan: OR today for A1 pulley release. We again reviewed the risks of surgery which include bleeding, infection, damage to neurovascular structures, persistent symptoms, persistent pain, need for additional surgery.  Informed consent was signed.  All questions were  answered.   Sherilyn Cooter, M.D. [facility] 8:32 AM

## 2022-09-29 NOTE — Anesthesia Postprocedure Evaluation (Signed)
Anesthesia Post Note  Patient: Breanna Pratt  Procedure(s) Performed: MIDDLE TRIGGER RELEASE (Right: Finger)     Patient location during evaluation: PACU Anesthesia Type: MAC Level of consciousness: awake Pain management: pain level controlled Vital Signs Assessment: post-procedure vital signs reviewed and stable Respiratory status: spontaneous breathing, nonlabored ventilation and respiratory function stable Cardiovascular status: blood pressure returned to baseline and stable Postop Assessment: no apparent nausea or vomiting Anesthetic complications: no   No notable events documented.  Last Vitals:  Vitals:   09/29/22 0927 09/29/22 0938  BP: 124/60 (!) 117/53  Pulse: (!) 57 (!) 55  Resp: 12 16  Temp:  36.6 C  SpO2: 96% 96%    Last Pain:  Vitals:   09/29/22 0938  TempSrc: Oral  PainSc: 0-No pain                 Braxten Memmer P Aiva Miskell

## 2022-09-29 NOTE — Transfer of Care (Signed)
Immediate Anesthesia Transfer of Care Note  Patient: Breanna Pratt  Procedure(s) Performed: MIDDLE TRIGGER RELEASE (Right: Finger)  Patient Location: PACU  Anesthesia Type:MAC  Level of Consciousness: drowsy  Airway & Oxygen Therapy: Patient Spontanous Breathing and Patient connected to face mask oxygen  Post-op Assessment: Report given to RN and Post -op Vital signs reviewed and stable  Post vital signs: Reviewed and stable  Last Vitals:  Vitals Value Taken Time  BP 118/62 09/29/22 0903  Temp    Pulse 53 09/29/22 0905  Resp 10 09/29/22 0905  SpO2 100 % 09/29/22 0905  Vitals shown include unvalidated device data.  Last Pain:  Vitals:   09/29/22 0719  TempSrc: Oral  PainSc: 0-No pain      Patients Stated Pain Goal: 6 ([id number])  Complications: No notable events documented.

## 2022-09-29 NOTE — Op Note (Signed)
   Date of Surgery: 09/29/2022  INDICATIONS: Patient is a 67 y.o.-year-old female with right middle finger triggering that has failed conservative management.  Risks, benefits, and alternatives to surgery were again discussed with the patient in the preoperative area. The patient wishes to proceed with surgery.  Informed consent was signed after our discussion.   PREOPERATIVE DIAGNOSIS:  Right middle trigger finger  POSTOPERATIVE DIAGNOSIS: Same.  PROCEDURE:  Right middle finger A1 pulley release   SURGEON: Audria Nine, M.D.  ASSIST: None  ANESTHESIA:  Local + MAC  IV FLUIDS AND URINE: See anesthesia.  ESTIMATED BLOOD LOSS: L mL.  IMPLANTS: * No implants in log *   DRAINS: None  COMPLICATIONS: None  DESCRIPTION OF PROCEDURE: The patient was met in the preoperative holding area where the surgical site was marked and the consent form was signed.  The patient was then taken to the operating room and a hand table placed adjacent to the operative extremity and locked into place.  All bony prominences were well padded.  A tourniquet was applied to the right forearm.  A formal time-out was performed to confirm that this was the correct patient, surgery, side, and site.  A local block was performed using 0.25% plain marcaine. The operative extremity was prepped and draped in the usual and sterile fashion.    Following a second timeout, the limb was exsanguinated and the tourniquet inflated to 250 mmHg.  A longitudinal incision was made over the A1 pulley.  The skin was incised.  Blunt dissection was used to identify the A1 pulley.  Two Ragnell retractors were placed on the radial and ulnar sides of the pulley to protect the respective neurovascular bundles.  A third Ragnell was placed at the distal aspect of the wound.  The A1 pulley was clearly identified.  Under direct visualization, the pulley was entered sharply using a 15 blade.  Tenotomy scissors were used to complete the pulley  release distally to the level of the A2 pulley.  The distal retractor was then placed in the proximal aspect of the wound.  Under direct visualization, the proximal aspect of the A1 pulley was completely released.   Following satisfactory A1 pulley release, the patient was reversed from sedation and asked to fully flex and extend the involved finger.  There was no catching or triggering present.  The tourniquet was let down and hemostasis achieved with direct pressure and bipolar electrocautery.  The wound was then thoroughly irrigated.  It was closed using 4-0 nylon sutures in a horizontal mattress fashion.  The wound was dressed with xeroform, 4x4, and an ace wrap.   The patient was reversed from sedation.  All counts were correct x 2 at the end of the procedure.  The patient was then taken to the PACU in stable condition.   POSTOPERATIVE PLAN: She will be discharged to home with appropriate pain medication and discharge instructions.  I will see her back in the office in 10-14 days for her first postop visit.   Audria Nine, MD 9:02 AM

## 2022-09-29 NOTE — Discharge Instructions (Addendum)
Breanna Pratt, M.D. Hand Surgery  POST-OPERATIVE DISCHARGE INSTRUCTIONS   PRESCRIPTIONS: - You may have been given a prescription to be taken as directed for post-operative pain control.  You may also take over the counter ibuprofen/aleve and tylenol for pain. Take this as directed on the packaging. Do not exceed 3000 mg tylenol/acetaminophen in 24 hours.  Ibuprofen 600-800 mg (3-4) tablets by mouth every 6 hours as needed for pain.   OR  Aleve 2 tablets by mouth every 12 hours (twice daily) as needed for pain.   AND/OR  Tylenol 1000 mg (2 tablets) every 8 hours as needed for pain.  - Please use your pain medication carefully, as refills are limited and you may not be provided with one.  As stated above, please use over the counter pain medicine - it will also be helpful with decreasing your swelling.    ANESTHESIA: -After your surgery, post-surgical discomfort or pain is likely. This discomfort can last several days to a few weeks. At certain times of the day your discomfort may be more intense.   Did you receive a nerve block?   - A nerve block can provide pain relief for one hour to two days after your surgery. As long as the nerve block is working, you will experience little or no sensation in the area the surgeon operated on.  - As the nerve block wears off, you will begin to experience pain or discomfort. It is very important that you begin taking your prescribed pain medication before the nerve block fully wears off. Treating your pain at the first sign of the block wearing off will ensure your pain is better controlled and more tolerable when full-sensation returns. Do not wait until the pain is intolerable, as the medicine will be less effective. It is better to treat pain in advance than to try and catch up.   General Anesthesia:  If you did not receive a nerve block during your surgery, you will need to start taking your pain medication shortly after your surgery and  should continue to do so as prescribed by your surgeon.     ICE AND ELEVATION: - You may use ice for the first 48-72 hours, but it is not critical.   - Motion of your fingers is very important to decrease the swelling.  - Elevation, as much as possible for the next 48 hours, is critical for decreasing swelling as well as for pain relief. Elevation means when you are seated or lying down, you hand should be at or above your heart. When walking, the hand needs to be at or above the level of your elbow.  - If the bandage gets too tight, it may need to be loosened. Please contact our office and we will instruct you in how to do this.    SURGICAL BANDAGES:  - Keep your dressing and/or splint clean and dry at all times.  You can remove your dressing 5 days from now and change with a dry dressing or Band-Aids as needed thereafter. - You may place a plastic bag over your bandage to shower, but be careful, do not get your bandages wet.  - After the bandages have been removed, it is OK to get the stitches wet in a shower or with hand washing. Do Not soak or submerge the wound yet. Please do not use lotions or creams on the stitches.      HAND THERAPY:  - You may not need any. If you  do, we will begin this at your follow up visit in the clinic.    ACTIVITY AND WORK: - You are encouraged to move any fingers which are not in the bandage.  - Light use of the fingers is allowed to assist the other hand with daily hygiene and eating, but strong gripping or lifting is often uncomfortable and should be avoided.  - You might miss a variable period of time from work and hopefully this issue has been discussed prior to surgery. You may not do any heavy work with your affected hand for about 2 weeks.    [facility] [address], [address] [place], [place] [address] [phone]    Post Anesthesia Home Care Instructions  Activity: Get plenty of rest for the remainder of the day. A  responsible individual must stay with you for 24 hours following the procedure.  For the next 24 hours, DO NOT: -Drive a car -Paediatric nurse -Drink alcoholic beverages -Take any medication unless instructed by your physician -Make any legal decisions or sign important papers.  Meals: Start with liquid foods such as gelatin or soup. Progress to regular foods as tolerated. Avoid greasy, spicy, heavy foods. If nausea and/or vomiting occur, drink only clear liquids until the nausea and/or vomiting subsides. Call your physician if vomiting continues.  Special Instructions/Symptoms: Your throat may feel dry or sore from the anesthesia or the breathing tube placed in your throat during surgery. If this causes discomfort, gargle with warm salt water. The discomfort should disappear within 24 hours.  If you had a scopolamine patch placed behind your ear for the management of post- operative nausea and/or vomiting:  1. The medication in the patch is effective for 72 hours, after which it should be removed.  Wrap patch in a tissue and discard in the trash. Wash hands thoroughly with soap and water. 2. You may remove the patch earlier than 72 hours if you experience unpleasant side effects which may include dry mouth, dizziness or visual disturbances. 3. Avoid touching the patch. Wash your hands with soap and water after contact with the patch.

## 2022-09-29 NOTE — Anesthesia Preprocedure Evaluation (Addendum)
Anesthesia Evaluation  Patient identified by MRN, date of birth, ID band Patient awake    Reviewed: Allergy & Precautions, NPO status , Patient's Chart, lab work & pertinent test results  Airway Mallampati: II  TM Distance: >3 FB Neck ROM: Full    Dental no notable dental hx.    Pulmonary neg pulmonary ROS   Pulmonary exam normal        Cardiovascular negative cardio ROS Normal cardiovascular exam     Neuro/Psych  Headaches  Neuromuscular disease  negative psych ROS   GI/Hepatic Neg liver ROS,GERD  Medicated and Controlled,,  Endo/Other  diabetes, Insulin Dependent    Renal/GU negative Renal ROS     Musculoskeletal  (+) Arthritis ,    Abdominal   Peds  Hematology negative hematology ROS (+)   Anesthesia Other Findings Right middle trigger finger  Reproductive/Obstetrics                             Anesthesia Physical Anesthesia Plan  ASA: 3  Anesthesia Plan: MAC   Post-op Pain Management:    Induction: Intravenous  PONV Risk Score and Plan: 2 and Ondansetron, Dexamethasone, Propofol infusion, Midazolam and Treatment may vary due to age or medical condition  Airway Management Planned: Simple Face Mask  Additional Equipment:   Intra-op Plan:   Post-operative Plan:   Informed Consent: I have reviewed the patients History and Physical, chart, labs and discussed the procedure including the risks, benefits and alternatives for the proposed anesthesia with the patient or authorized representative who has indicated his/her understanding and acceptance.     Dental advisory given  Plan Discussed with: CRNA  Anesthesia Plan Comments:        Anesthesia Quick Evaluation

## 2022-09-30 ENCOUNTER — Encounter (HOSPITAL_BASED_OUTPATIENT_CLINIC_OR_DEPARTMENT_OTHER): Payer: Self-pay | Admitting: Orthopedic Surgery

## 2022-10-13 ENCOUNTER — Ambulatory Visit: Payer: BC Managed Care – PPO | Admitting: Internal Medicine

## 2022-10-13 ENCOUNTER — Ambulatory Visit
Admission: RE | Admit: 2022-10-13 | Discharge: 2022-10-13 | Disposition: A | Discharge status: Home / Self Care | Payer: BC Managed Care – PPO | Source: Ambulatory Visit | Attending: Surgery | Admitting: Surgery

## 2022-10-13 DIAGNOSIS — Z1231 Encounter for screening mammogram for malignant neoplasm of breast: Secondary | ICD-10-CM

## 2022-10-15 ENCOUNTER — Telehealth: Payer: Self-pay | Admitting: Internal Medicine

## 2022-10-15 ENCOUNTER — Telehealth: Payer: Self-pay

## 2022-10-15 NOTE — Telephone Encounter (Signed)
I sent patient message

## 2022-10-15 NOTE — Telephone Encounter (Signed)
Pt has appointment on 10/20/22 to follow up, review test results. She says her results came back normal, so she's wanting to know if she can just cancel the follow up and have the results sent to her PCP Dr. Lurline Del.

## 2022-10-20 ENCOUNTER — Ambulatory Visit: Payer: Self-pay | Admitting: Internal Medicine

## 2022-10-21 NOTE — Progress Notes (Signed)
cancelled

## 2022-12-15 ENCOUNTER — Other Ambulatory Visit: Payer: Self-pay | Admitting: Orthopedic Surgery

## 2022-12-15 DIAGNOSIS — M25561 Pain in right knee: Secondary | ICD-10-CM

## 2022-12-22 ENCOUNTER — Ambulatory Visit
Admission: RE | Admit: 2022-12-22 | Discharge: 2022-12-22 | Disposition: A | Discharge status: Home / Self Care | Payer: BC Managed Care – PPO | Source: Ambulatory Visit | Attending: Orthopedic Surgery | Admitting: Orthopedic Surgery

## 2022-12-22 DIAGNOSIS — M25561 Pain in right knee: Secondary | ICD-10-CM

## 2023-06-16 NOTE — Assessment & Plan Note (Deleted)
04/12/2017 Left lumpectomy: Fibrocystic changes; superior margin: Invasive pleomorphic lobular carcinoma grade 3, 1.5 cm, perineural invasion and lymphovascular invasion present, pleomorphic LCIS, margins negative, 0/5 lymph nodes negative, ER 100%, PR 100%, HER-2 negative, Ki-67 15% T1 CN 0 stage IA   Oncotype DX score 19: 12% risk of recurrence with tamoxifen alone over 10 years   Adjuvant radiation therapy 05/23/2017- 06/20/2017   Treatment plan: Adjuvant antiestrogen therapy with anastrozole 1 mg p.o. daily times 5-7 years started November 2018   Anastrozole toxicities: Denies any hot flashes.  She does have joint and muscle stiffness.  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  She is going to help take care of her who has blood disorder.  She will still come yearly for her appointments.   Return to clinic in 1 year for follow-up

## 2023-06-17 ENCOUNTER — Inpatient Hospital Stay: Payer: Self-pay | Attending: Hematology and Oncology | Admitting: Hematology and Oncology

## 2023-06-17 DIAGNOSIS — C50412 Malignant neoplasm of upper-outer quadrant of left female breast: Secondary | ICD-10-CM

## 2023-07-25 ENCOUNTER — Telehealth: Payer: Self-pay | Admitting: *Deleted

## 2023-07-25 DIAGNOSIS — C50412 Malignant neoplasm of upper-outer quadrant of left female breast: Secondary | ICD-10-CM

## 2023-07-25 NOTE — Telephone Encounter (Signed)
Error

## 2023-07-25 NOTE — Telephone Encounter (Signed)
Received call from pt stating she has relocated to [place] and is requesting referral for transfer of care to [facility].  RN successfully faxed referral to [phone].

## 2024-04-18 ENCOUNTER — Inpatient Hospital Stay (HOSPITAL_BASED_OUTPATIENT_CLINIC_OR_DEPARTMENT_OTHER)
Admission: EM | Admit: 2024-04-18 | Discharge: 2024-04-20 | DRG: 639 | Disposition: A | Discharge status: Home / Self Care | Attending: Internal Medicine | Admitting: Internal Medicine

## 2024-04-18 ENCOUNTER — Encounter (HOSPITAL_COMMUNITY): Payer: Self-pay | Admitting: Internal Medicine

## 2024-04-18 ENCOUNTER — Other Ambulatory Visit: Payer: Self-pay

## 2024-04-18 ENCOUNTER — Emergency Department (HOSPITAL_BASED_OUTPATIENT_CLINIC_OR_DEPARTMENT_OTHER)

## 2024-04-18 DIAGNOSIS — I10 Essential (primary) hypertension: Secondary | ICD-10-CM | POA: Diagnosis present

## 2024-04-18 DIAGNOSIS — E1010 Type 1 diabetes mellitus with ketoacidosis without coma: Secondary | ICD-10-CM | POA: Diagnosis not present

## 2024-04-18 DIAGNOSIS — M549 Dorsalgia, unspecified: Secondary | ICD-10-CM | POA: Diagnosis present

## 2024-04-18 DIAGNOSIS — Z8041 Family history of malignant neoplasm of ovary: Secondary | ICD-10-CM

## 2024-04-18 DIAGNOSIS — M1990 Unspecified osteoarthritis, unspecified site: Secondary | ICD-10-CM | POA: Diagnosis present

## 2024-04-18 DIAGNOSIS — Z79811 Long term (current) use of aromatase inhibitors: Secondary | ICD-10-CM

## 2024-04-18 DIAGNOSIS — E860 Dehydration: Secondary | ICD-10-CM | POA: Diagnosis present

## 2024-04-18 DIAGNOSIS — Z90710 Acquired absence of both cervix and uterus: Secondary | ICD-10-CM

## 2024-04-18 DIAGNOSIS — Z794 Long term (current) use of insulin: Secondary | ICD-10-CM

## 2024-04-18 DIAGNOSIS — Z853 Personal history of malignant neoplasm of breast: Secondary | ICD-10-CM

## 2024-04-18 DIAGNOSIS — D72829 Elevated white blood cell count, unspecified: Secondary | ICD-10-CM | POA: Diagnosis present

## 2024-04-18 DIAGNOSIS — Z9641 Presence of insulin pump (external) (internal): Secondary | ICD-10-CM | POA: Diagnosis present

## 2024-04-18 DIAGNOSIS — Z7984 Long term (current) use of oral hypoglycemic drugs: Secondary | ICD-10-CM

## 2024-04-18 DIAGNOSIS — G8929 Other chronic pain: Secondary | ICD-10-CM | POA: Diagnosis present

## 2024-04-18 DIAGNOSIS — E1110 Type 2 diabetes mellitus with ketoacidosis without coma: Secondary | ICD-10-CM | POA: Diagnosis present

## 2024-04-18 DIAGNOSIS — G43909 Migraine, unspecified, not intractable, without status migrainosus: Secondary | ICD-10-CM | POA: Diagnosis present

## 2024-04-18 DIAGNOSIS — Z923 Personal history of irradiation: Secondary | ICD-10-CM

## 2024-04-18 DIAGNOSIS — Z833 Family history of diabetes mellitus: Secondary | ICD-10-CM

## 2024-04-18 DIAGNOSIS — E875 Hyperkalemia: Secondary | ICD-10-CM | POA: Diagnosis present

## 2024-04-18 LAB — URINALYSIS, ROUTINE W REFLEX MICROSCOPIC
Bacteria, UA: NONE SEEN
Bilirubin Urine: NEGATIVE
Glucose, UA: >1000 mg/dL — AB
Hgb urine dipstick: NEGATIVE
Ketones, ur: >80 mg/dL — AB
Nitrite: NEGATIVE
Protein, ur: NEGATIVE mg/dL
Specific Gravity, Urine: 1.028 (ref 1.005–1.030)
pH: 5 (ref 5.0–8.0)

## 2024-04-18 LAB — COMPREHENSIVE METABOLIC PANEL WITH GFR
ALT: 14 U/L (ref 0–44)
AST: 18 U/L (ref 15–41)
Albumin: 4.6 g/dL (ref 3.5–5.0)
Alkaline Phosphatase: 106 U/L (ref 38–126)
Anion gap: 24 — ABNORMAL HIGH (ref 5–15)
BUN: 22 mg/dL (ref 8–23)
CO2: 15 mmol/L — ABNORMAL LOW (ref 22–32)
Calcium: 9.9 mg/dL (ref 8.9–10.3)
Chloride: 97 mmol/L — ABNORMAL LOW (ref 98–111)
Creatinine, Ser: 1.04 mg/dL — ABNORMAL HIGH (ref 0.44–1.00)
GFR, Estimated: 59 mL/min — ABNORMAL LOW (ref >60)
Glucose, Bld: 524 mg/dL (ref 70–99)
Potassium: 5.1 mmol/L (ref 3.5–5.1)
Sodium: 136 mmol/L (ref 135–145)
Total Bilirubin: 1.4 mg/dL — ABNORMAL HIGH (ref 0.0–1.2)
Total Protein: 8.1 g/dL (ref 6.5–8.1)

## 2024-04-18 LAB — I-STAT VENOUS BLOOD GAS, ED
Acid-base deficit: 9 mmol/L — ABNORMAL HIGH (ref 0.0–2.0)
Bicarbonate: 17.2 mmol/L — ABNORMAL LOW (ref 20.0–28.0)
Calcium, Ion: 1.21 mmol/L (ref 1.15–1.40)
HCT: 40 % (ref 36.0–46.0)
Hemoglobin: 13.6 g/dL (ref 12.0–15.0)
O2 Saturation: 80 %
Potassium: 5.2 mmol/L — ABNORMAL HIGH (ref 3.5–5.1)
Sodium: 135 mmol/L (ref 135–145)
TCO2: 18 mmol/L — ABNORMAL LOW (ref 22–32)
pCO2, Ven: 37.5 mmHg — ABNORMAL LOW (ref 44–60)
pH, Ven: 7.27 (ref 7.25–7.43)
pO2, Ven: 50 mmHg — ABNORMAL HIGH (ref 32–45)

## 2024-04-18 LAB — CBG MONITORING, ED
Glucose-Capillary: 487 mg/dL — ABNORMAL HIGH (ref 70–99)
Glucose-Capillary: 456 mg/dL — ABNORMAL HIGH (ref 70–99)
Glucose-Capillary: 394 mg/dL — ABNORMAL HIGH (ref 70–99)

## 2024-04-18 LAB — MRSA NEXT GEN BY PCR, NASAL: MRSA by PCR Next Gen: NOT DETECTED

## 2024-04-18 LAB — CBC
HCT: 35.1 % — ABNORMAL LOW (ref 36.0–46.0)
Hemoglobin: 12.1 g/dL (ref 12.0–15.0)
MCH: 35.4 pg — ABNORMAL HIGH (ref 26.0–34.0)
MCHC: 34.5 g/dL (ref 30.0–36.0)
MCV: 102.6 fL — ABNORMAL HIGH (ref 80.0–100.0)
Platelets: 243 K/uL (ref 150–400)
RBC: 3.42 MIL/uL — ABNORMAL LOW (ref 3.87–5.11)
RDW: 13.8 % (ref 11.5–15.5)
WBC: 19.8 K/uL — ABNORMAL HIGH (ref 4.0–10.5)
nRBC: 0 % (ref 0.0–0.2)

## 2024-04-18 LAB — BASIC METABOLIC PANEL WITH GFR
Anion gap: 20 — ABNORMAL HIGH (ref 5–15)
Anion gap: 15 (ref 5–15)
BUN: 21 mg/dL (ref 8–23)
BUN: 18 mg/dL (ref 8–23)
CO2: 15 mmol/L — ABNORMAL LOW (ref 22–32)
CO2: 17 mmol/L — ABNORMAL LOW (ref 22–32)
Calcium: 9.1 mg/dL (ref 8.9–10.3)
Calcium: 8.7 mg/dL — ABNORMAL LOW (ref 8.9–10.3)
Chloride: 103 mmol/L (ref 98–111)
Chloride: 106 mmol/L (ref 98–111)
Creatinine, Ser: 0.94 mg/dL (ref 0.44–1.00)
Creatinine, Ser: 0.84 mg/dL (ref 0.44–1.00)
GFR, Estimated: >60 mL/min (ref >60)
GFR, Estimated: >60 mL/min (ref >60)
Glucose, Bld: 223 mg/dL — ABNORMAL HIGH (ref 70–99)
Glucose, Bld: 165 mg/dL — ABNORMAL HIGH (ref 70–99)
Potassium: 4.5 mmol/L (ref 3.5–5.1)
Potassium: 4.4 mmol/L (ref 3.5–5.1)
Sodium: 138 mmol/L (ref 135–145)
Sodium: 137 mmol/L (ref 135–145)

## 2024-04-18 LAB — BETA-HYDROXYBUTYRIC ACID: Beta-Hydroxybutyric Acid: 7.15 mmol/L — ABNORMAL HIGH (ref 0.05–0.27)

## 2024-04-18 LAB — HIV ANTIBODY (ROUTINE TESTING W REFLEX): HIV Screen 4th Generation wRfx: NONREACTIVE

## 2024-04-18 LAB — MAGNESIUM: Magnesium: 2.1 mg/dL (ref 1.7–2.4)

## 2024-04-18 LAB — GLUCOSE, CAPILLARY
Glucose-Capillary: 286 mg/dL — ABNORMAL HIGH (ref 70–99)
Glucose-Capillary: 220 mg/dL — ABNORMAL HIGH (ref 70–99)
Glucose-Capillary: 167 mg/dL — ABNORMAL HIGH (ref 70–99)
Glucose-Capillary: 150 mg/dL — ABNORMAL HIGH (ref 70–99)
Glucose-Capillary: 133 mg/dL — ABNORMAL HIGH (ref 70–99)

## 2024-04-18 LAB — PHOSPHORUS: Phosphorus: 3.7 mg/dL (ref 2.5–4.6)

## 2024-04-18 MED ORDER — CHLORHEXIDINE GLUCONATE CLOTH 2 % EX PADS
6.0000 | MEDICATED_PAD | Freq: Every day | CUTANEOUS | Status: DC
  Administered 2024-04-18 – 2024-04-20 (×3): 6 via TOPICAL

## 2024-04-18 MED ORDER — SODIUM CHLORIDE 0.9 % IV BOLUS
1000.0000 mL | Freq: Once | INTRAVENOUS | Status: AC
  Administered 2024-04-18: 1000 mL via INTRAVENOUS

## 2024-04-18 MED ORDER — ANASTROZOLE 1 MG PO TABS
1.0000 mg | ORAL_TABLET | Freq: Every day | ORAL | Status: DC
  Administered 2024-04-19 – 2024-04-20 (×2): 1 mg via ORAL
  Filled 2024-04-18 (×2): qty 1

## 2024-04-18 MED ORDER — VITAMIN B-12 100 MCG PO TABS
100.0000 ug | ORAL_TABLET | Freq: Every day | ORAL | Status: DC
  Administered 2024-04-19 – 2024-04-20 (×2): 100 ug via ORAL
  Filled 2024-04-18 (×2): qty 1

## 2024-04-18 MED ORDER — LACTATED RINGERS IV SOLN: INTRAVENOUS | Status: DC

## 2024-04-18 MED ORDER — ACETAMINOPHEN 325 MG PO TABS: 650.0000 mg | ORAL_TABLET | ORAL | Status: DC | PRN

## 2024-04-18 MED ORDER — DEXTROSE IN LACTATED RINGERS 5 % IV SOLN
INTRAVENOUS | Status: DC
Start: 2024-04-18 — End: 2024-04-19

## 2024-04-18 MED ORDER — ONDANSETRON HCL 4 MG PO TABS: 4.0000 mg | ORAL_TABLET | Freq: Four times a day (QID) | ORAL | Status: DC | PRN

## 2024-04-18 MED ORDER — LACTATED RINGERS IV BOLUS: 20.0000 mL/kg | Freq: Once | INTRAVENOUS | Status: DC

## 2024-04-18 MED ORDER — LACTATED RINGERS IV BOLUS
1000.0000 mL | Freq: Once | INTRAVENOUS | Status: AC
  Administered 2024-04-18: 1000 mL via INTRAVENOUS

## 2024-04-18 MED ORDER — PREGABALIN 75 MG PO CAPS
100.0000 mg | ORAL_CAPSULE | Freq: Three times a day (TID) | ORAL | Status: DC
  Administered 2024-04-18 – 2024-04-20 (×5): 100 mg via ORAL
  Filled 2024-04-18 (×5): qty 1

## 2024-04-18 MED ORDER — ORAL CARE MOUTH RINSE: 15.0000 mL | OROMUCOSAL | Status: DC | PRN

## 2024-04-18 MED ORDER — DEXTROSE 50 % IV SOLN: 0.0000 mL | INTRAVENOUS | Status: DC | PRN

## 2024-04-18 MED ORDER — ENOXAPARIN SODIUM 40 MG/0.4ML IJ SOSY
40.0000 mg | PREFILLED_SYRINGE | INTRAMUSCULAR | Status: DC
  Administered 2024-04-18 – 2024-04-19 (×2): 40 mg via SUBCUTANEOUS
  Filled 2024-04-18 (×2): qty 0.4

## 2024-04-18 MED ORDER — LACTATED RINGERS IV BOLUS: 500.0000 mL | Freq: Once | INTRAVENOUS | Status: DC

## 2024-04-18 MED ORDER — ONDANSETRON HCL 4 MG/2ML IJ SOLN: 4.0000 mg | Freq: Four times a day (QID) | INTRAMUSCULAR | Status: DC | PRN

## 2024-04-18 MED ORDER — INSULIN REGULAR(HUMAN) IN NACL 100-0.9 UT/100ML-% IV SOLN
INTRAVENOUS | Status: DC
Start: 2024-04-18 — End: 2024-04-19
  Administered 2024-04-18: 6.5 [IU]/h via INTRAVENOUS
  Filled 2024-04-18: qty 100

## 2024-04-18 MED ORDER — PANTOPRAZOLE SODIUM 40 MG IV SOLR
40.0000 mg | INTRAVENOUS | Status: DC
  Administered 2024-04-18 – 2024-04-19 (×2): 40 mg via INTRAVENOUS
  Filled 2024-04-18 (×2): qty 10

## 2024-04-18 NOTE — ED Notes (Signed)
CBG 394

## 2024-04-18 NOTE — Inpatient Diabetes Management (Signed)
 Inpatient Diabetes Program Recommendations  AACE/ADA: New Consensus Statement on Inpatient Glycemic Control (2015)  Target Ranges:  Prepandial:   less than 140 mg/dL      Peak postprandial:   less than 180 mg/dL (1-2 hours)      Critically ill patients:  140 - 180 mg/dL   Lab Results  Component Value Date   GLUCAP 487 (H) 04/18/2024    Review of Glycemic Control  Latest Reference Range & Units 04/18/24 13:16  Glucose-Capillary 70 - 99 mg/dL 512 (H)  (H): Data is abnormally high  Latest Reference Range & Units 04/18/24 14:32  CO2 22 - 32 mmol/L 15 (L)  (L): Data is abnormally low  Diabetes history: DM1  Outpatient Diabetes medications:  Omnipod with the Dexcom G7 Not aware of insulin  pump settings  Current orders for Inpatient glycemic control: IV fluids  Spoke with patient on the phone as she is at [employer].  She states she has been told she has T1DM.  Used to see Dr. Balan but has moved to [facility] and sees a different endo now.  She does not know her insulin  pump settings.  She states her glucose started to become elevated last evening to 500's.  She had her Omnipod on and gave herself an insulin  pen injection and her glucose came down to 200's but then went back up.  She did not change her insulin  pump.  Her pump is due to be changed on 9/11 along with her Dexcom.    Educated her about hyperglycemia and the insulin  pump.  If her blood sugar remains elevated on the pump she should change out her pump with a new Omnipod as the catheter may be kinked, or the pump may have malfunctioned.  She verbalizes understanding.    Thank you, Wyvonna Pinal, MSN, CDCES Diabetes Coordinator Inpatient Diabetes Program [phone] (team pager from 8a-5p)

## 2024-04-18 NOTE — ED Provider Notes (Signed)
 [facility] AT [facility] Provider Note   CSN: [id number] Arrival date & time: 04/18/24  1306     Patient presents with: Hyperglycemia   Breanna Pratt is a 68 y.o. female.    Hyperglycemia Patient presents with hyperglycemia.  Sugars above 500 at home.  Normally run 150.  Is type I diabetic.  Is on insulin  pump.  Has a dull headache.  Generalized weakness.  No reason why sugar should be running high.      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   B12 deficiency    Breast cancer (HCC) 05/2017   stg 1, radiation therapy   Chronic back pain    Diabetes mellitus    takes Metformin  daily;takes Humalog takes  8in am .12u at lunch and 15 at dinner.Lantus  35units at bedtime;;Average fasting sugars 100-120   Eczema    GERD (gastroesophageal reflux disease)    Prevacid prn   Glaucoma    Hemorrhoids    Herpes    Migraine    Osteopenia    Paresthesias    hands   Peripheral neuropathy    takes Gabapentin    Personal history of radiation therapy     Prior to Admission medications   Medication Sig Start Date End Date Taking? Authorizing Provider  anastrozole  (ARIMIDEX ) 1 MG tablet Take 1 tablet (1 mg total) by mouth daily. 06/16/22   Odean Potts, MD  BD PEN NEEDLE NANO U/F 32G X 4 MM MISC  01/21/14   [provider]  cholecalciferol (VITAMIN D3) 25 MCG (1000 UNIT) tablet Take 1,000 Units by mouth daily.    [provider]  clobetasol cream (TEMOVATE) 0.05 % APPLY TO AFFECTED AREA UP TO TWICE DAILY AS NEEDED *NO ON FACR, GROIN, OR UNDER ARMS*    [provider]  Continuous Blood Gluc Sensor (DEXCOM G6 SENSOR) MISC SMARTSIG:Topical Every 10 Days 05/18/22   [provider]  Continuous Blood Gluc Transmit (DEXCOM G6 TRANSMITTER) MISC See admin instructions. 05/29/22   [provider]  HUMALOG 100 UNIT/ML injection INJECT 40 UNITS SUBCUTANEOUSLY VIA PUMP    [provider]   Insulin  Disposable Pump (OMNIPOD 5 G6 POD, GEN 5,) MISC Take by mouth every other day. 05/16/22   [provider]  insulin  lispro (HUMALOG) 100 UNIT/ML injection Inject 8-11 Units into the skin 3 (three) times daily before meals. Per sliding scale    [provider]  ONE TOUCH ULTRA TEST test strip  02/20/14   [provider]  pregabalin  (LYRICA ) 100 MG capsule TAKE 1 CAPSULE BY MOUTH THREE TIMES A DAY    [provider]  sitaGLIPtan-metformin  (JANUMET) 50-1000 MG per tablet Take 1 tablet by mouth daily.     [provider]  vitamin B-12 (CYANOCOBALAMIN ) 100 MCG tablet Take 100 mcg by mouth daily.    [provider]  lisinopril  (PRINIVIL ,ZESTRIL ) 2.5 MG tablet Take 2.5 mg by mouth daily.    10/15/11  [provider]  metFORMIN  (GLUCOPHAGE ) 500 MG tablet Take 500 mg by mouth daily.    10/15/11  [provider]    Allergies: Ace inhibitors, Altace [ramipril], and Mobic [meloxicam]    Review of Systems  Updated Vital Signs BP (!) 136/57 (BP Location: Right Arm)   Pulse 82   Temp 98.3 F (36.8 C) (Oral) Comment: Simultaneous filing. User may not have seen previous data. Comment (Src): Simultaneous filing. User may not have seen previous data.  Resp 18   SpO2 100%   Physical Exam Vitals and nursing note reviewed.  Cardiovascular:     Rate and Rhythm: Regular rhythm.  Pulmonary:     Breath sounds: No wheezing.  Abdominal:     Tenderness: There is no abdominal tenderness.  Musculoskeletal:     Comments: Insulin  pump on left upper arm.  Dexcom on right upper arm  Neurological:     Mental Status: She is alert and oriented to person, place, and time.     (all labs ordered are listed, but only abnormal results are displayed) Labs Reviewed  CBC - Abnormal; Notable for the following components:      Result Value   WBC 19.8 (*)    RBC 3.42 (*)    HCT 35.1 (*)    MCV 102.6 (*)    MCH 35.4 (*)    All other components  within normal limits  URINALYSIS, ROUTINE W REFLEX MICROSCOPIC - Abnormal; Notable for the following components:   Color, Urine COLORLESS (*)    Glucose, UA >1,000 (*)    Ketones, ur >80 (*)    Leukocytes,Ua SMALL (*)    All other components within normal limits  CBG MONITORING, ED - Abnormal; Notable for the following components:   Glucose-Capillary 487 (*)    All other components within normal limits  COMPREHENSIVE METABOLIC PANEL WITH GFR  I-STAT VENOUS BLOOD GAS, ED    EKG: None  Radiology: No results found.   Procedures   Medications Ordered in the ED  lactated ringers  bolus 1,000 mL (1,000 mLs Intravenous New Bag/Given 04/18/24 1424)                                    Medical Decision Making Amount and/or Complexity of Data Reviewed Labs: ordered.   Patient type I diabetic with hyperglycemia.  Compliant with medication.  Sugars elevated up to about 500.  Upon arrival was 447.  Reviewed insulin  dosing.  Is on a basal rate at 0.05 units/h.  Did have a bolus of under 3 units this morning.  Will give fluid bolus.  Will get more blood work here including venous gas.  Differential diagnose includes hyperglycemia versus DKA.  Infection also considered.  White count elevated 19.  Urine has greater than 80 ketones.  Care will be turned over to Dr. Randol    Final diagnoses:  None    ED Discharge Orders     None          Patsey Lot, MD 04/18/24 1505

## 2024-04-18 NOTE — ED Notes (Signed)
 No change in IV insulin  rate was recommended by Endot[facility]. Drip remains at 6.5 units of insulin /hour.

## 2024-04-18 NOTE — H&P (Signed)
 History and Physical    Breanna Pratt FMW:[id number] DOB: August 06, 1956 DOA: 04/18/2024  PCP: Alexa Rush NOVAK, NP   Patient coming from: Home  Chief Complaint  Patient presents with   Hyperglycemia     HPI: Breanna Pratt is a 68 y.o. year old female with PMH of type 1 diabetes mellitus on insulin  pump, hypertension migraine, arthritis, glaucoma, chronic back pain, history of breast cancer s/p radiation, B12 deficiency presented to the ED 9/10 with blood sugar running high in 560s all day FOR 2-3 days and has ssociated dry mouth, generalized weakness and dull headache. Patient reports being compliant with diet and insulin  and blood sugar normally in 150s. Patient denies any fever chills nausea vomiting or abdominal pain.  She has been traveling for last few days from [place]  to [facility] for training. ? If she had accidentally turned off her pump-she does not know her insulin  pump setting. Patient otherwise denies any focal weakness, numbness tingling, speech difficulties.   In the ED: Vitals fairly stable BP in 110, heart rate 90s, afebrile, saturating well on room air. Labs obtained showed pH 7.27 CO2 37 bicarb 17, BMP with potassium 5.1 bicarb 15 blood sugar 524, anion gap 24 leukocytosis 19.8 hemoglobin 12.1 UA unremarkable WBC 0-5 LE small.  Ketones more than 80.chest x-ray no acute cardiopulmonary abnormalities. Patient was given IV fluid bolus LR 1 L and insulin  drip was started for DKA and admission was requested for further management Currently blood sugar in 286, rechecking BMP  Assessment and plan:  DKA Type 1 diabetes mellitus on insulin  pump with ongoing hyperglycemia Dehydrated/volume depleted: Patient with anion gap metabolic acidosis hyperglycemia.  Labs with leukocytosis but no obvious infection on chest x-ray and urine, he is afebrile.  No abdominal complaints or fever.  She has been traveling for last few days from [place]  to [facility] for training it seems and-?  If she  had accidentally turned off her pump. Patient has been placed on IV fluids bolus and insulin  drip-Continue DKA protocol with insulin  drip with serial BMP and CBC check, keep n.p.o. while on insulin  drip.  Appears dehydrated volume depleted will write for additional 1 L normal saline Diabetes coordinator has been consulted to assist-May need to adjust her OmniPod/catheter when restarting.  Mild hyperkalemia: Due to DKA.  Check serial BMP  Hypertension: BP stable holding meds continue as needed IV PRN  Leukocytosis: Unclear etiology could be in setting of 1/with hemoconcentration and volume depletion No obvious infection on urine chest x-ray and no fever, no abdomen pain.  Recheck CBC in a.m.  Migraine Arthritis Chronic back pain: Continue pain meds prn.  History of breast cancer s/p radiation: Resume anastrozole     DVT prophylaxis: enoxaparin  (LOVENOX ) injection 40 mg Start: 04/18/24 2000 Code Status:   Code Status: Full Code  Objective: Vitals last 24 hrs: Vitals:   04/18/24 1600 04/18/24 1630 04/18/24 1700 04/18/24 1819  BP: (!) 118/54 (!) 118/45 (!) 106/43 116/63  Pulse: 85 97 95 91  Resp: 16 18 16 16   Temp:    98.1 F (36.7 C)  TempSrc:    Oral  SpO2: 98% 100% 98% 97%  Weight: 56 kg   55.4 kg  Height:    5' 2 (1.575 m)    Physical Examination: General exam: alert awake, oriented, older than stated age HEENT:Oral mucosa DRY, Ear/Nose WNL grossly Respiratory system: Bilaterally clear BS,no use of accessory muscle Cardiovascular system: S1 & S2 +, No JVD. Gastrointestinal system: Abdomen soft,NT,ND, BS+ Nervous System:  Alert, awake, moving all extremities,and following commands. Extremities: extremities warm, leg edema neg Skin: No rashes,no icterus. MSK: Normal muscle bulk,tone, power   Medications reviewed:  Scheduled Meds:  Chlorhexidine  Gluconate Cloth  6 each Topical Daily   enoxaparin  (LOVENOX ) injection  40 mg Subcutaneous Q24H   pantoprazole  (PROTONIX )  IV  40 mg Intravenous Q24H   Continuous Infusions:  dextrose  5% lactated ringers      insulin  6.5 Units/hr (04/18/24 1824)   lactated ringers  125 mL/hr at 04/18/24 1824   sodium chloride      Diet: Diet Order             Diet NPO time specified  Diet effective now                     Severity of Illness: The appropriate patient status for this patient is OBSERVATION. Observation status is judged to be reasonable and necessary in order to provide the required intensity of service to ensure the patient's safety. The patient's presenting symptoms, physical exam findings, and initial radiographic and laboratory data in the context of their medical condition is felt to place them at decreased risk for further clinical deterioration. Furthermore, it is anticipated that the patient will be medically stable for discharge from the hospital within 2 midnights of admission.   Family Communication: Admission, patients condition and plan of care including tests being ordered have been discussed with the patient  her friend who indicate understanding and agree with the plan and Code Status.  Consults called:  none  Review of Systems: All systems were reviewed and were negative except as mentioned in HPI above. Negative for fever Negative for chest pain Negative for shortness of breath  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   B12 deficiency    Breast cancer (HCC) 05/2017   stg 1, radiation therapy   Chronic back pain    Diabetes mellitus    takes Metformin  daily;takes Humalog takes  8in am .12u at lunch and 15 at dinner.Lantus  35units at bedtime;;Average fasting sugars 100-120   Eczema    GERD (gastroesophageal reflux disease)    Prevacid prn   Glaucoma    Hemorrhoids    Herpes    Migraine    Osteopenia    Paresthesias    hands   Peripheral neuropathy    takes Gabapentin    Personal history of radiation therapy     Past Surgical History:  Procedure  Laterality Date   ABDOMINAL HYSTERECTOMY  10+yrs ago   BREAST LUMPECTOMY WITH RADIOACTIVE SEED AND SENTINEL LYMPH NODE BIOPSY Left 04/12/2017   Procedure: LEFT BREAST LUMPECTOMY WITH RADIOACTIVE SEED AND SENTINEL LYMPH NODE BIOPSY WITH BLUE DYE INJECTION;  Surgeon: Belinda Cough, MD;  Location: [facility];  Service: General;  Laterality: Left;   COLONOSCOPY  2010   CYSTOSCOPY     as a teenager   DILATION AND CURETTAGE OF UTERUS  30+yrs ago   x 2   SHOULDER ARTHROSCOPY WITH ROTATOR CUFF REPAIR Bilateral    TRIGGER FINGER RELEASE  03/2019   TRIGGER FINGER RELEASE Right 09/29/2022   Procedure: MIDDLE TRIGGER RELEASE;  Surgeon: Romona Harari, MD;  Location: [facility];  Service: Orthopedics;  Laterality: Right;  local 45   TUBAL LIGATION       reports that she has never smoked. She has never used smokeless tobacco. She reports that she does not drink alcohol and does not use  drugs.  Allergies  Allergen Reactions   Ace Inhibitors Swelling   Altace [Ramipril] Other (See Comments)    angioedema   Mobic [Meloxicam] Other (See Comments)    ineffective    Family History  Problem Relation Age of Onset   Other Mother        brain tumor, ovarian cancer, blood, bone cancer   Other Father        gsw complications   Diabetes Father    Other Maternal Grandfather        brain tumor   Anesthesia problems Neg Hx    Hypotension Neg Hx    Malignant hyperthermia Neg Hx    Pseudochol deficiency Neg Hx    Breast cancer Neg Hx      Prior to Admission medications   Medication Sig Start Date End Date Taking? Authorizing Provider  anastrozole  (ARIMIDEX ) 1 MG tablet Take 1 tablet (1 mg total) by mouth daily. 06/16/22   Odean Potts, MD  BD PEN NEEDLE NANO U/F 32G X 4 MM MISC  01/21/14   [provider]  cholecalciferol (VITAMIN D3) 25 MCG (1000 UNIT) tablet Take 1,000 Units by mouth daily.    [provider]  clobetasol cream (TEMOVATE) 0.05 %  APPLY TO AFFECTED AREA UP TO TWICE DAILY AS NEEDED *NO ON FACR, GROIN, OR UNDER ARMS*    [provider]  Continuous Blood Gluc Sensor (DEXCOM G6 SENSOR) MISC SMARTSIG:Topical Every 10 Days 05/18/22   [provider]  Continuous Blood Gluc Transmit (DEXCOM G6 TRANSMITTER) MISC See admin instructions. 05/29/22   [provider]  HUMALOG 100 UNIT/ML injection INJECT 40 UNITS SUBCUTANEOUSLY VIA PUMP    [provider]  Insulin  Disposable Pump (OMNIPOD 5 G6 POD, GEN 5,) MISC Take by mouth every other day. 05/16/22   [provider]  insulin  lispro (HUMALOG) 100 UNIT/ML injection Inject 8-11 Units into the skin 3 (three) times daily before meals. Per sliding scale    [provider]  ONE TOUCH ULTRA TEST test strip  02/20/14   [provider]  pregabalin  (LYRICA ) 100 MG capsule TAKE 1 CAPSULE BY MOUTH THREE TIMES A DAY    [provider]  sitaGLIPtan-metformin  (JANUMET) 50-1000 MG per tablet Take 1 tablet by mouth daily.     [provider]  vitamin B-12 (CYANOCOBALAMIN ) 100 MCG tablet Take 100 mcg by mouth daily.    [provider]  lisinopril  (PRINIVIL ,ZESTRIL ) 2.5 MG tablet Take 2.5 mg by mouth daily.    10/15/11  [provider]  metFORMIN  (GLUCOPHAGE ) 500 MG tablet Take 500 mg by mouth daily.    10/15/11  [provider]  Labs on Admission: I have personally reviewed following labs and imaging studies CBC: Recent Labs  Lab 04/18/24 1432 04/18/24 1458  WBC 19.8*  --   HGB 12.1 13.6  HCT 35.1* 40.0  MCV 102.6*  --   PLT 243  --    Basic Metabolic Panel: Recent Labs  Lab 04/18/24 1432 04/18/24 1458  NA 136 135  K 5.1 5.2*  CL 97*  --   CO2 15*  --   GLUCOSE 524*  --   BUN 22  --   CREATININE 1.04*  --   CALCIUM 9.9  --    Estimated Creatinine Clearance: 41.5 mL/min (A) (by C-G formula based on SCr of 1.04 mg/dL (H)). Recent Labs  Lab 04/18/24 1432  AST 18  ALT 14  ALKPHOS 106   BILITOT 1.4*  PROT  8.1  ALBUMIN 4.6  CBG: Recent Labs  Lab 04/18/24 1316 04/18/24 1625 04/18/24 1731 04/18/24 1841  GLUCAP 487* 456* 394* 286*   Lipid Profile: No results for input(s): CHOL, HDL, LDLCALC, TRIG, CHOLHDL, LDLDIRECT in the last 72 hours. Thyroid  Function Tests: No results for input(s): TSH, T4TOTAL, FREET4, T3FREE, THYROIDAB in the last 72 hours. Urine analysis:    Component Value Date/Time   COLORURINE COLORLESS (A) 04/18/2024 1345   APPEARANCEUR CLEAR 04/18/2024 1345   LABSPEC 1.028 04/18/2024 1345   PHURINE 5.0 04/18/2024 1345   GLUCOSEU >1,000 (A) 04/18/2024 1345   HGBUR NEGATIVE 04/18/2024 1345   BILIRUBINUR NEGATIVE 04/18/2024 1345   KETONESUR >80 (A) 04/18/2024 1345   PROTEINUR NEGATIVE 04/18/2024 1345   UROBILINOGEN 0.2 10/15/2011 0255   NITRITE NEGATIVE 04/18/2024 1345   LEUKOCYTESUR SMALL (A) 04/18/2024 1345    Radiological Exams on Admission: DG Chest Portable 1 View Result Date: 04/18/2024 CLINICAL DATA:  weakness EXAM: PORTABLE CHEST - 1 VIEW COMPARISON:  03/11/2021 FINDINGS: Low lung volumes with bronchovascular crowding. No focal airspace consolidation, pleural effusion, or pneumothorax. The cardiac silhouette is at the upper limits of normal, likely accentuated by AP technique and low lung volumes.No acute fracture or destructive lesion. IMPRESSION: No acute cardiopulmonary abnormality. Electronically Signed   By: Rogelia Myers M.D.   On: 04/18/2024 17:13   Mennie LAMY MD [facility]  If 7PM-7AM, please contact night-coverage [email or web address]  04/18/2024, 7:17 PM

## 2024-04-18 NOTE — ED Provider Notes (Signed)
 EKG Interpretation Date/Time:  Wednesday April 18 2024 17:11:11 EDT Ventricular Rate:  92 PR Interval:  138 QRS Duration:  82 QT Interval:  353 QTC Calculation: 437 R Axis:   52  Text Interpretation: Sinus rhythm Atrial premature complex Nonspecific T abnormalities, anterior leads No significant change since last tracing Confirmed by Randol Simmonds [phone]) on 04/18/2024 5:13:11 PM       DG Chest Portable 1 View Result Date: 04/18/2024 CLINICAL DATA:  weakness EXAM: PORTABLE CHEST - 1 VIEW COMPARISON:  03/11/2021 FINDINGS: Low lung volumes with bronchovascular crowding. No focal airspace consolidation, pleural effusion, or pneumothorax. The cardiac silhouette is at the upper limits of normal, likely accentuated by AP technique and low lung volumes.No acute fracture or destructive lesion. IMPRESSION: No acute cardiopulmonary abnormality. Electronically Signed   By: Rogelia Myers M.D.   On: 04/18/2024 17:13    .Critical Care  Performed by: Randol Simmonds, MD Authorized by: Randol Simmonds, MD   Critical care provider statement:    Critical care time (minutes):  30   Critical care was time spent personally by me on the following activities:  Development of treatment plan with patient or surrogate, discussions with consultants, evaluation of patient's response to treatment, examination of patient, ordering and review of laboratory studies, ordering and review of radiographic studies, ordering and performing treatments and interventions, pulse oximetry, re-evaluation of patient's condition and review of old charts  Patient was initially seen by Dr. Patsey.  Please see his note.  Patient presented with complaints of hyperglycemia and generalized weakness.  Laboratory test show hyperglycemia.  Patient also has elevated anion gap acidosis suggestive of DKA.  No anion factious etiology noted.  No evidence of pneumonia.  No UTI.  No significant findings noted on EKG.  Is possible that patient's  insulin  pump may not be working properly.  Will start her on insulin  drip admit to the hospital for further treatment.  Case discussed with Dr Sebastian regarding admission   Randol Simmonds, MD 04/18/24 [phone]

## 2024-04-18 NOTE — ED Notes (Signed)
 I have just given report to [place], Charity fundraiser at [facility].

## 2024-04-18 NOTE — ED Notes (Signed)
 Called Thomas at [facility] for transport

## 2024-04-18 NOTE — Care Plan (Signed)
 Plan of Care Note for accepted transfer   Patient: Breanna Pratt MRN: [id number]   DOA: 04/18/2024  Facility requesting transfer: [facility] Requesting Provider: Dr Randol Reason for transfer: DKA Facility course: Patient is a pleasant 68 year old female history of type 1 diabetes on insulin  pump presented to the ED with hypoglycemia as she noted her blood sugars to be greater than 500s with some associated weakness.  Patient denied any fevers, no chills, no nausea or vomiting, no dysuria, no cough, no infectious symptoms. Patient subsequently presented to the ED, venous blood gas obtained with a pH of 7.27/pCO2 of 38/pCO2 of 50/bicarb of 17.2.  Comprehensive metabolic profile done with a blood glucose of 524, bicarb of 15, anion gap of 24.  CBC done with a white count of 19.8 otherwise within normal limits.  Urinalysis done with glycosuria, ketonuria, small leukocytes, nitrite negative, 0-5 WBCs.  Patient placed on Endo tool and placed on IV fluids.  Diabetic coordinator following.  Plan of care: The patient is accepted for admission to [facility] unit, at [facility].. Asked ED PA to obtain chest x-ray and EKG as well which are pending.  Author: Toribio Hummer, MD 04/18/2024  Check [email or web address] for on-call coverage.  Nursing staff, Please call [facility] Admits & Consults System-Wide number on Amion as soon as patient's arrival, so appropriate admitting provider can evaluate the pt.

## 2024-04-18 NOTE — ED Triage Notes (Signed)
 States BG has been around 560s all day. Normally around 150. Type 1. Endorses dry mouth, headache, and generalized weakness starting thisn morning.

## 2024-04-18 NOTE — ED Notes (Signed)
 CBG 456. Data transfer delayed.

## 2024-04-18 NOTE — Hospital Course (Addendum)
 Breanna Pratt is a 68 y.o. year old female with PMH of type 1 diabetes mellitus on insulin  pump, hypertension migraine, arthritis, glaucoma, chronic back pain, history of breast cancer s/p radiation, B12 deficiency presented to the ED 9/10 with blood sugar running high in 560s all day FOR 2-3 days and has ssociated dry mouth, generalized weakness and dull headache. Patient reports being compliant with diet and insulin  and blood sugar normally in 150s. Patient denies any fever chills nausea vomiting or abdominal pain.  She has been traveling for last few days from [place]  to [facility] for training. ? If she had accidentally turned off her pump-she does not know her insulin  pump setting. Patient otherwise denies any focal weakness, numbness tingling, speech difficulties.   In the ED: Vitals fairly stable BP in 110, heart rate 90s, afebrile, saturating well on room air. Labs obtained showed pH 7.27 CO2 37 bicarb 17, BMP with potassium 5.1 bicarb 15 blood sugar 524, anion gap 24 leukocytosis 19.8 hemoglobin 12.1 UA unremarkable WBC 0-5 LE small.  Ketones more than 80.chest x-ray no acute cardiopulmonary abnormalities. Patient was given IV fluid bolus LR 1 L and insulin  drip was started for DKA and admission was requested for further management Currently blood sugar in 286, rechecking BMP  Assessment and plan:  DKA Type 1 diabetes mellitus on insulin  pump with ongoing hyperglycemia Dehydrated/volume depleted: Patient with anion gap metabolic acidosis hyperglycemia.  Labs with leukocytosis but no obvious infection on chest x-ray and urine, he is afebrile.  No abdominal complaints or fever.  She has been traveling for last few days from [place]  to [facility] for training it seems and-?  If she had accidentally turned off her pump. Patient has been placed on IV fluids bolus and insulin  drip-Continue DKA protocol with insulin  drip with serial BMP and CBC check, keep n.p.o. while on insulin  drip.  Appears  dehydrated volume depleted will write for additional 1 L normal saline Diabetes coordinator has been consulted to assist-May need to adjust her OmniPod/catheter when restarting.  Mild hyperkalemia: Due to DKA.  Check serial BMP  Hypertension: BP stable holding meds continue as needed IV PRN  Leukocytosis: Unclear etiology could be in setting of 1/with hemoconcentration and volume depletion No obvious infection on urine chest x-ray and no fever, no abdomen pain.  Recheck CBC in a.m.  Migraine Arthritis Chronic back pain: Continue pain meds prn.  History of breast cancer s/p radiation: Resume anastrozole     DVT prophylaxis: enoxaparin  (LOVENOX ) injection 40 mg Start: 04/18/24 2000 Code Status:   Code Status: Full Code  Objective: Vitals last 24 hrs: Vitals:   04/18/24 1600 04/18/24 1630 04/18/24 1700 04/18/24 1819  BP: (!) 118/54 (!) 118/45 (!) 106/43 116/63  Pulse: 85 97 95 91  Resp: 16 18 16 16   Temp:    98.1 F (36.7 C)  TempSrc:    Oral  SpO2: 98% 100% 98% 97%  Weight: 56 kg   55.4 kg  Height:    5' 2 (1.575 m)    Physical Examination: General exam: alert awake, oriented, older than stated age HEENT:Oral mucosa DRY, Ear/Nose WNL grossly Respiratory system: Bilaterally clear BS,no use of accessory muscle Cardiovascular system: S1 & S2 +, No JVD. Gastrointestinal system: Abdomen soft,NT,ND, BS+ Nervous System: Alert, awake, moving all extremities,and following commands. Extremities: extremities warm, leg edema neg Skin: No rashes,no icterus. MSK: Normal muscle bulk,tone, power   Medications reviewed:  Scheduled Meds:  Chlorhexidine  Gluconate Cloth  6 each Topical Daily  enoxaparin  (LOVENOX ) injection  40 mg Subcutaneous Q24H   pantoprazole  (PROTONIX ) IV  40 mg Intravenous Q24H   Continuous Infusions:  dextrose  5% lactated ringers      insulin  6.5 Units/hr (04/18/24 1824)   lactated ringers  125 mL/hr at 04/18/24 1824   sodium chloride      Diet: Diet  Order             Diet NPO time specified  Diet effective now

## 2024-04-19 DIAGNOSIS — Z833 Family history of diabetes mellitus: Secondary | ICD-10-CM | POA: Diagnosis not present

## 2024-04-19 DIAGNOSIS — M1990 Unspecified osteoarthritis, unspecified site: Secondary | ICD-10-CM | POA: Diagnosis present

## 2024-04-19 DIAGNOSIS — Z79811 Long term (current) use of aromatase inhibitors: Secondary | ICD-10-CM | POA: Diagnosis not present

## 2024-04-19 DIAGNOSIS — E875 Hyperkalemia: Secondary | ICD-10-CM | POA: Diagnosis present

## 2024-04-19 DIAGNOSIS — Z853 Personal history of malignant neoplasm of breast: Secondary | ICD-10-CM | POA: Diagnosis not present

## 2024-04-19 DIAGNOSIS — E1110 Type 2 diabetes mellitus with ketoacidosis without coma: Secondary | ICD-10-CM | POA: Diagnosis present

## 2024-04-19 DIAGNOSIS — G43909 Migraine, unspecified, not intractable, without status migrainosus: Secondary | ICD-10-CM | POA: Diagnosis present

## 2024-04-19 DIAGNOSIS — Z90710 Acquired absence of both cervix and uterus: Secondary | ICD-10-CM | POA: Diagnosis not present

## 2024-04-19 DIAGNOSIS — Z794 Long term (current) use of insulin: Secondary | ICD-10-CM | POA: Diagnosis not present

## 2024-04-19 DIAGNOSIS — E860 Dehydration: Secondary | ICD-10-CM | POA: Diagnosis present

## 2024-04-19 DIAGNOSIS — Z9641 Presence of insulin pump (external) (internal): Secondary | ICD-10-CM | POA: Diagnosis present

## 2024-04-19 DIAGNOSIS — Z8041 Family history of malignant neoplasm of ovary: Secondary | ICD-10-CM | POA: Diagnosis not present

## 2024-04-19 DIAGNOSIS — D72829 Elevated white blood cell count, unspecified: Secondary | ICD-10-CM | POA: Diagnosis present

## 2024-04-19 DIAGNOSIS — G8929 Other chronic pain: Secondary | ICD-10-CM | POA: Diagnosis present

## 2024-04-19 DIAGNOSIS — E1010 Type 1 diabetes mellitus with ketoacidosis without coma: Secondary | ICD-10-CM | POA: Diagnosis present

## 2024-04-19 DIAGNOSIS — Z7984 Long term (current) use of oral hypoglycemic drugs: Secondary | ICD-10-CM | POA: Diagnosis not present

## 2024-04-19 DIAGNOSIS — I10 Essential (primary) hypertension: Secondary | ICD-10-CM | POA: Diagnosis present

## 2024-04-19 DIAGNOSIS — M549 Dorsalgia, unspecified: Secondary | ICD-10-CM | POA: Diagnosis present

## 2024-04-19 DIAGNOSIS — Z923 Personal history of irradiation: Secondary | ICD-10-CM | POA: Diagnosis not present

## 2024-04-19 LAB — GLUCOSE, CAPILLARY
Glucose-Capillary: 140 mg/dL — ABNORMAL HIGH (ref 70–99)
Glucose-Capillary: 143 mg/dL — ABNORMAL HIGH (ref 70–99)
Glucose-Capillary: 146 mg/dL — ABNORMAL HIGH (ref 70–99)
Glucose-Capillary: 156 mg/dL — ABNORMAL HIGH (ref 70–99)
Glucose-Capillary: 156 mg/dL — ABNORMAL HIGH (ref 70–99)
Glucose-Capillary: 157 mg/dL — ABNORMAL HIGH (ref 70–99)
Glucose-Capillary: 161 mg/dL — ABNORMAL HIGH (ref 70–99)
Glucose-Capillary: 167 mg/dL — ABNORMAL HIGH (ref 70–99)
Glucose-Capillary: 168 mg/dL — ABNORMAL HIGH (ref 70–99)
Glucose-Capillary: 171 mg/dL — ABNORMAL HIGH (ref 70–99)
Glucose-Capillary: 174 mg/dL — ABNORMAL HIGH (ref 70–99)
Glucose-Capillary: 174 mg/dL — ABNORMAL HIGH (ref 70–99)
Glucose-Capillary: 179 mg/dL — ABNORMAL HIGH (ref 70–99)
Glucose-Capillary: 189 mg/dL — ABNORMAL HIGH (ref 70–99)
Glucose-Capillary: 197 mg/dL — ABNORMAL HIGH (ref 70–99)
Glucose-Capillary: 198 mg/dL — ABNORMAL HIGH (ref 70–99)
Glucose-Capillary: 241 mg/dL — ABNORMAL HIGH (ref 70–99)

## 2024-04-19 LAB — CBC
HCT: 33.5 % — ABNORMAL LOW (ref 36.0–46.0)
Hemoglobin: 10.7 g/dL — ABNORMAL LOW (ref 12.0–15.0)
MCH: 34.3 pg — ABNORMAL HIGH (ref 26.0–34.0)
MCHC: 31.9 g/dL (ref 30.0–36.0)
MCV: 107.4 fL — ABNORMAL HIGH (ref 80.0–100.0)
Platelets: 243 K/uL (ref 150–400)
RBC: 3.12 MIL/uL — ABNORMAL LOW (ref 3.87–5.11)
RDW: 14.1 % (ref 11.5–15.5)
WBC: 12.4 K/uL — ABNORMAL HIGH (ref 4.0–10.5)
nRBC: 0 % (ref 0.0–0.2)

## 2024-04-19 LAB — BASIC METABOLIC PANEL WITH GFR
Anion gap: 11 (ref 5–15)
Anion gap: 11 (ref 5–15)
Anion gap: 11 (ref 5–15)
BUN: 14 mg/dL (ref 8–23)
BUN: 12 mg/dL (ref 8–23)
BUN: 10 mg/dL (ref 8–23)
CO2: 19 mmol/L — ABNORMAL LOW (ref 22–32)
CO2: 19 mmol/L — ABNORMAL LOW (ref 22–32)
CO2: 21 mmol/L — ABNORMAL LOW (ref 22–32)
Calcium: 8.4 mg/dL — ABNORMAL LOW (ref 8.9–10.3)
Calcium: 8.5 mg/dL — ABNORMAL LOW (ref 8.9–10.3)
Calcium: 8.7 mg/dL — ABNORMAL LOW (ref 8.9–10.3)
Chloride: 108 mmol/L (ref 98–111)
Chloride: 108 mmol/L (ref 98–111)
Chloride: 109 mmol/L (ref 98–111)
Creatinine, Ser: 0.79 mg/dL (ref 0.44–1.00)
Creatinine, Ser: 0.72 mg/dL (ref 0.44–1.00)
Creatinine, Ser: 0.75 mg/dL (ref 0.44–1.00)
GFR, Estimated: >60 mL/min (ref >60)
GFR, Estimated: >60 mL/min (ref >60)
GFR, Estimated: >60 mL/min (ref >60)
Glucose, Bld: 178 mg/dL — ABNORMAL HIGH (ref 70–99)
Glucose, Bld: 182 mg/dL — ABNORMAL HIGH (ref 70–99)
Glucose, Bld: 178 mg/dL — ABNORMAL HIGH (ref 70–99)
Potassium: 4 mmol/L (ref 3.5–5.1)
Potassium: 4 mmol/L (ref 3.5–5.1)
Potassium: 4.3 mmol/L (ref 3.5–5.1)
Sodium: 138 mmol/L (ref 135–145)
Sodium: 138 mmol/L (ref 135–145)
Sodium: 141 mmol/L (ref 135–145)

## 2024-04-19 LAB — HEMOGLOBIN A1C
Hgb A1c MFr Bld: 6.9 % — ABNORMAL HIGH (ref 4.8–5.6)
Mean Plasma Glucose: 151.33 mg/dL

## 2024-04-19 LAB — BETA-HYDROXYBUTYRIC ACID
Beta-Hydroxybutyric Acid: 4.85 mmol/L — ABNORMAL HIGH (ref 0.05–0.27)
Beta-Hydroxybutyric Acid: 3.34 mmol/L — ABNORMAL HIGH (ref 0.05–0.27)
Beta-Hydroxybutyric Acid: 1.07 mmol/L — ABNORMAL HIGH (ref 0.05–0.27)

## 2024-04-19 MED ORDER — INSULIN ASPART 100 UNIT/ML IJ SOLN
3.0000 [IU] | Freq: Three times a day (TID) | INTRAMUSCULAR | Status: DC
  Administered 2024-04-19: 3 [IU] via SUBCUTANEOUS

## 2024-04-19 MED ORDER — INSULIN ASPART 100 UNIT/ML IJ SOLN
0.0000 [IU] | Freq: Three times a day (TID) | INTRAMUSCULAR | Status: DC
  Administered 2024-04-19: 2 [IU] via SUBCUTANEOUS
  Administered 2024-04-20: 7 [IU] via SUBCUTANEOUS
  Administered 2024-04-20: 5 [IU] via SUBCUTANEOUS

## 2024-04-19 MED ORDER — INSULIN ASPART 100 UNIT/ML IJ SOLN
0.0000 [IU] | Freq: Every day | INTRAMUSCULAR | Status: DC
  Administered 2024-04-19: 2 [IU] via SUBCUTANEOUS

## 2024-04-19 MED ORDER — LATANOPROST 0.005 % OP SOLN
1.0000 [drp] | Freq: Every day | OPHTHALMIC | Status: DC
  Administered 2024-04-19: 1 [drp] via OPHTHALMIC
  Filled 2024-04-19: qty 2.5

## 2024-04-19 MED ORDER — INSULIN GLARGINE 100 UNIT/ML ~~LOC~~ SOLN
12.0000 [IU] | Freq: Every day | SUBCUTANEOUS | Status: DC
  Administered 2024-04-19: 12 [IU] via SUBCUTANEOUS
  Filled 2024-04-19 (×2): qty 0.12

## 2024-04-19 NOTE — Progress Notes (Signed)
 PROGRESS NOTE    Breanna Pratt  FMW:[id number] DOB: 1955/10/17 DOA: 04/18/2024 PCP: Alexa Rush NOVAK, NP    Brief Narrative:   Breanna Pratt is a 68 y.o. female with past medical history significant for DM1 on insulin  pump, HTN, migraine headache, osteoarthritis, glaucoma, chronic back pain, history of breast cancer s/p radiation, B12 deficiency who presented to [facility] ED on 04/18/2024 for elevated glucose.  Reportedly blood sugar has been in the 560s all day, normally around 150.  Additionally reports dry mouth, headache and generalized weakness.  Reports compliance with her insulin  regimen via insulin  pump, and diet.  Has traveled to [place] from [place]  for her job.  Follows with endocrinology outpatient.  In the ED, temperature 98.3 F, HR 82, RR 18, BP 136/57, SpO2 100% on room air.  VBG with pH 7.27, pCO2 37.5, PaO2 50.  WBC 19.8, hemoglobin 12.1, platelet count 243.  Sodium 136, potassium 5.1, chloride 97, CO2 15, glucose 527, BUN 22, creatinine 1.04.  Urinalysis with greater than 80 ketones, greater than 1000 glucose, negative nitrite, small leukocytes, no bacteria, 0-5 WBCs.  Chest x-ray with no acute cardiopulmonary disease process.  Patient was given 1 L LR fluid bolus, started on insulin  drip for DKA.  TRH consulted for admission and patient was transferred to [facility] for further evaluation and management.  Assessment & Plan:   Diabetic ketoacidosis Type 1 diabetes mellitus Patient presenting with 2-day history of dry mouth, headache, generalized weakness with blood sugar readings in the 560s.  On insulin  pump and Janumet at baseline, reports that her glucose is typically well-controlled in the 150s and adheres to a controlled carbohydrate diet.  Unclear etiology of her hyperglycemia, consideration for pump malfunction or empty reservoir.  Glucose on admission 527 with anion gap 24 and greater than 80 ketones on urinalysis. -- Diabetic educator following --  Hemoglobin A1c 6.9 -- Anion gap 24>>15>11 -- Beta hydroxybutyrate acid 7.1>>3.3 -- Continue insulin  drip -- Awaiting diabetic educator for assistance with transitioning insulin  drip back to her insulin  pump  Leukocytosis WBC count elevated 19.8 on admission, no infectious etiology; likely reactive. -- WBC 19.8>12.4 -- CBC in am  Hyperkalemia, mild Etiology likely secondary to DKA. -- K 5.1>>4.0 -- BMP in am  HTN Currently not on antihypertensives outpatient.  BP 106/42, controlled.  Migraine headache Osteoarthritis Chronic back pain Stable   DVT prophylaxis: enoxaparin  (LOVENOX ) injection 40 mg Start: 04/18/24 2000    Code Status: Full Code Family Communication: No family present at bedside this morning  Disposition Plan:  Level of care: Stepdown Status is: Observation The patient remains OBS appropriate and will d/c before 2 midnights.    Consultants:  None  Procedures:  None  Antimicrobials:  None   Subjective: Patient seen examined bedside, lying in bed.  Glucose improved with closure of anion gap.  Remains on insulin  drip.  Patient reports that she will have family bring in her insulin  pump.  Discussed with diabetic educator who will see later this morning for assistance on transitioning from insulin  drip back to her pump.  Patient also request new OmniPod/Dexcom sensor.  Discussed with RN this morning.  Patient with no other specific complaints, concerns or questions at this time.  Denies headache, no dizziness, no chest pain, no palpitations, no shortness of breath, no abdominal pain, discussed with RN this morning.  Patient with no other specific complaints, concerns or questions at this time.  Denies headache, no dizziness, no chest pain, no palpitations, no shortness  of breath, no abdominal pain, no fever/chills/night sweats, no nausea/vomiting/diarrhea, no focal weakness, no fatigue, no paresthesia.  No acute events overnight per nursing  staff.  Objective: Vitals:   04/19/24 0600 04/19/24 0700 04/19/24 0800 04/19/24 0900  BP: (!) 109/49 (!) 115/48 (!) 109/51 (!) 113/45  Pulse: (!) 34 (!) 53 62 63  Resp: 13 16 20 14   Temp:   (!) 97.5 F (36.4 C)   TempSrc:   Oral   SpO2: 99% 99% 99% 100%  Weight:      Height:        Intake/Output Summary (Last 24 hours) at 04/19/2024 0953 Last data filed at 04/19/2024 0900 Gross per 24 hour  Intake 3012.29 ml  Output --  Net 3012.29 ml   Filed Weights   04/18/24 1600 04/18/24 1819  Weight: 56 kg 55.4 kg    Examination:  Physical Exam: GEN: NAD, alert and oriented x 3, wd/wn HEENT: NCAT, PERRL, EOMI, sclera clear, MMM PULM: CTAB w/o wheezes/crackles, normal respiratory effort, on room air CV: RRR w/o M/G/R GI: abd soft, NTND, + BS MSK: no peripheral edema, muscle strength globally intact 5/5 bilateral upper/lower extremities NEURO: No focal neurological deficit PSYCH: normal mood/affect Integumentary: No concerning rashes/lesions/wounds noted on exposed skin surfaces    Data Reviewed: I have personally reviewed following labs and imaging studies  CBC: Recent Labs  Lab 04/18/24 1432 04/18/24 1458 04/19/24 0310  WBC 19.8*  --  12.4*  HGB 12.1 13.6 10.7*  HCT 35.1* 40.0 33.5*  MCV 102.6*  --  107.4*  PLT 243  --  243   Basic Metabolic Panel: Recent Labs  Lab 04/18/24 1432 04/18/24 1458 04/18/24 1929 04/18/24 2248 04/19/24 0310 04/19/24 0647  NA 136 135 138 137 138 138  K 5.1 5.2* 4.5 4.4 4.0 4.0  CL 97*  --  103 106 108 108  CO2 15*  --  15* 17* 19* 19*  GLUCOSE 524*  --  223* 165* 178* 182*  BUN 22  --  21 18 14 12   CREATININE 1.04*  --  0.94 0.84 0.79 0.72  CALCIUM 9.9  --  9.1 8.7* 8.4* 8.5*  MG  --   --  2.1  --   --   --   PHOS  --   --  3.7  --   --   --    GFR: Estimated Creatinine Clearance: 54 mL/min (by C-G formula based on SCr of 0.72 mg/dL). Liver Function Tests: Recent Labs  Lab 04/18/24 1432  AST 18  ALT 14  ALKPHOS 106   BILITOT 1.4*  PROT 8.1  ALBUMIN 4.6   No results for input(s): LIPASE, AMYLASE in the last 168 hours. No results for input(s): AMMONIA in the last 168 hours. Coagulation Profile: No results for input(s): INR, PROTIME in the last 168 hours. Cardiac Enzymes: No results for input(s): CKTOTAL, CKMB, CKMBINDEX, TROPONINI in the last 168 hours. BNP (last 3 results) No results for input(s): PROBNP in the last 8760 hours. HbA1C: Recent Labs    04/19/24 0310  HGBA1C 6.9*   CBG: Recent Labs  Lab 04/19/24 0419 04/19/24 0525 04/19/24 0628 04/19/24 0732 04/19/24 0847  GLUCAP 161* 140* 143* 171* 179*   Lipid Profile: No results for input(s): CHOL, HDL, LDLCALC, TRIG, CHOLHDL, LDLDIRECT in the last 72 hours. Thyroid  Function Tests: No results for input(s): TSH, T4TOTAL, FREET4, T3FREE, THYROIDAB in the last 72 hours. Anemia Panel: No results for input(s): VITAMINB12, FOLATE, FERRITIN, TIBC, IRON, RETICCTPCT in  the last 72 hours. Sepsis Labs: No results for input(s): PROCALCITON, LATICACIDVEN in the last 168 hours.  Recent Results (from the past 240 hours)  MRSA Next Gen by PCR, Nasal     Status: None   Collection Time: 04/18/24  6:28 PM   Specimen: Nasal Mucosa; Nasal Swab  Result Value Ref Range Status   MRSA by PCR Next Gen NOT DETECTED NOT DETECTED Final    Comment: (NOTE) The GeneXpert MRSA Assay (FDA approved for NASAL specimens only), is one component of a comprehensive MRSA colonization surveillance program. It is not intended to diagnose MRSA infection nor to guide or monitor treatment for MRSA infections. Test performance is not FDA approved in patients less than 77 years old. Performed at [facility], [address] [place], [place] [address]          Radiology Studies: DG Chest Portable 1 View Result Date: 04/18/2024 CLINICAL DATA:  weakness EXAM: PORTABLE CHEST - 1 VIEW  COMPARISON:  03/11/2021 FINDINGS: Low lung volumes with bronchovascular crowding. No focal airspace consolidation, pleural effusion, or pneumothorax. The cardiac silhouette is at the upper limits of normal, likely accentuated by AP technique and low lung volumes.No acute fracture or destructive lesion. IMPRESSION: No acute cardiopulmonary abnormality. Electronically Signed   By: Rogelia Myers M.D.   On: 04/18/2024 17:13        Scheduled Meds:  anastrozole   1 mg Oral Daily   Chlorhexidine  Gluconate Cloth  6 each Topical Daily   enoxaparin  (LOVENOX ) injection  40 mg Subcutaneous Q24H   pantoprazole  (PROTONIX ) IV  40 mg Intravenous Q24H   pregabalin   100 mg Oral TID   vitamin B-12  100 mcg Oral Daily   Continuous Infusions:  dextrose  5% lactated ringers  125 mL/hr at 04/19/24 0900   insulin  1.4 Units/hr (04/19/24 0900)   lactated ringers  125 mL/hr at 04/18/24 1900     LOS: 0 days    Time spent: 52 minutes spent on 04/19/2024 caring for this patient face-to-face including chart review, ordering labs/tests, documenting, discussion with nursing staff, consultants, updating family and interview/physical exam    Camellia PARAS [place], DO [facility] Available via Epic secure chat 7am-7pm After these hours, please refer to coverage provider listed on [email or web address] 04/19/2024, 9:53 AM

## 2024-04-19 NOTE — Inpatient Diabetes Management (Signed)
 Inpatient Diabetes Program Recommendations  AACE/ADA: New Consensus Statement on Inpatient Glycemic Control (2015)  Target Ranges:  Prepandial:   less than 140 mg/dL      Peak postprandial:   less than 180 mg/dL (1-2 hours)      Critically ill patients:  140 - 180 mg/dL   Lab Results  Component Value Date   GLUCAP 157 (H) 04/19/2024   HGBA1C 6.9 (H) 04/19/2024    Review of Glycemic Control  Diabetes history: DM1 Outpatient Diabetes medications: OmniPod with the Dexcom G6 Current orders for Inpatient glycemic control: IV insulin  drip  Inpatient Diabetes Program Recommendations:    Spoke with pt at bedside regarding her insulin  pump. Pt states she does not have another Pod to go into pump. Will need to transition to basal/bolus and pt can restart pump at home (24H after Lantus  dose is given.) Instructed pt to call pump co to do quality control check to make certain pump is not malfunctioning, with pt going into DKA while on pump. Catheter may have been kinked or insertion needle bent. Pt verbalizes understanding.   Criteria met to transition off drip:  Consider Lantus  10-12 units daily  Novolog  0-9 TID with meals and 0-5 HS  Novolog  3-4 units TID with meals if eating > 50%  Continue to follow.  Thank you. Shona Brandy, RD, LDN, CDCES Inpatient Diabetes Coordinator [phone]

## 2024-04-20 ENCOUNTER — Other Ambulatory Visit (HOSPITAL_COMMUNITY): Payer: Self-pay

## 2024-04-20 DIAGNOSIS — E1010 Type 1 diabetes mellitus with ketoacidosis without coma: Secondary | ICD-10-CM | POA: Diagnosis not present

## 2024-04-20 LAB — BASIC METABOLIC PANEL WITH GFR
Anion gap: 11 (ref 5–15)
BUN: 7 mg/dL — ABNORMAL LOW (ref 8–23)
CO2: 21 mmol/L — ABNORMAL LOW (ref 22–32)
Calcium: 8.5 mg/dL — ABNORMAL LOW (ref 8.9–10.3)
Chloride: 107 mmol/L (ref 98–111)
Creatinine, Ser: 0.71 mg/dL (ref 0.44–1.00)
GFR, Estimated: >60 mL/min (ref >60)
Glucose, Bld: 244 mg/dL — ABNORMAL HIGH (ref 70–99)
Potassium: 3.8 mmol/L (ref 3.5–5.1)
Sodium: 139 mmol/L (ref 135–145)

## 2024-04-20 LAB — CBC
HCT: 35.2 % — ABNORMAL LOW (ref 36.0–46.0)
Hemoglobin: 10.9 g/dL — ABNORMAL LOW (ref 12.0–15.0)
MCH: 33.2 pg (ref 26.0–34.0)
MCHC: 31 g/dL (ref 30.0–36.0)
MCV: 107.3 fL — ABNORMAL HIGH (ref 80.0–100.0)
Platelets: 220 K/uL (ref 150–400)
RBC: 3.28 MIL/uL — ABNORMAL LOW (ref 3.87–5.11)
RDW: 13.8 % (ref 11.5–15.5)
WBC: 6.1 K/uL (ref 4.0–10.5)
nRBC: 0 % (ref 0.0–0.2)

## 2024-04-20 LAB — GLUCOSE, CAPILLARY
Glucose-Capillary: 289 mg/dL — ABNORMAL HIGH (ref 70–99)
Glucose-Capillary: 337 mg/dL — ABNORMAL HIGH (ref 70–99)

## 2024-04-20 MED ORDER — LANCETS MISC
1.0000 | Freq: Three times a day (TID) | 0 refills | Status: AC
  Filled 2024-04-20: qty 100, 34d supply, fill #0

## 2024-04-20 MED ORDER — INSULIN GLARGINE 100 UNIT/ML SOLOSTAR PEN
15.0000 [IU] | PEN_INJECTOR | Freq: Every day | SUBCUTANEOUS | 0 refills | Status: AC
  Filled 2024-04-20: qty 15, 100d supply, fill #0

## 2024-04-20 MED ORDER — PEN NEEDLES 31G X 5 MM MISC
1.0000 | Freq: Three times a day (TID) | 0 refills | Status: AC
  Filled 2024-04-20: qty 100, 34d supply, fill #0

## 2024-04-20 MED ORDER — BLOOD GLUCOSE TEST VI STRP
1.0000 | ORAL_STRIP | Freq: Three times a day (TID) | 0 refills | Status: AC
  Filled 2024-04-20: qty 100, 34d supply, fill #0

## 2024-04-20 MED ORDER — ACCU-CHEK GUIDE W/DEVICE KIT
1.0000 | PACK | Freq: Three times a day (TID) | 0 refills | Status: AC
  Filled 2024-04-20: qty 1, 30d supply, fill #0

## 2024-04-20 MED ORDER — LANCET DEVICE MISC
1.0000 | Freq: Three times a day (TID) | 0 refills | Status: AC
  Filled 2024-04-20: qty 1, fill #0

## 2024-04-20 MED ORDER — INSULIN ASPART 100 UNIT/ML IJ SOLN
5.0000 [IU] | Freq: Three times a day (TID) | INTRAMUSCULAR | Status: DC
  Administered 2024-04-20 (×2): 5 [IU] via SUBCUTANEOUS

## 2024-04-20 MED ORDER — INSULIN GLARGINE 100 UNIT/ML ~~LOC~~ SOLN
15.0000 [IU] | Freq: Every day | SUBCUTANEOUS | Status: DC
  Administered 2024-04-20: 15 [IU] via SUBCUTANEOUS
  Filled 2024-04-20: qty 0.15

## 2024-04-20 MED ORDER — INSULIN ASPART 100 UNIT/ML FLEXPEN
5.0000 [IU] | PEN_INJECTOR | Freq: Three times a day (TID) | SUBCUTANEOUS | 0 refills | Status: AC
  Filled 2024-04-20 (×2): qty 15, 30d supply, fill #0

## 2024-04-20 NOTE — Progress Notes (Signed)
 Discharge meds in a secure bag delivered to pt in room- overide complete for Novolog  pen.

## 2024-04-20 NOTE — Discharge Summary (Signed)
 Physician Discharge Summary  Breanna Pratt DOB: [birth year] DOA: 04/18/2024  PCP: Alexa Rush NOVAK, NP  Admit date: 04/18/2024 Discharge date: 04/20/2024  Admitted From: Home Disposition: Home  Recommendations for Outpatient Follow-up:  Follow up with PCP in 1-2 weeks Follow-up with endocrinology next week Continue to hold on restarting insulin  pump until follows up with manufacture/endocrinology to ensure operable Started on Lantus  15 units subcutaneous daily, Humalog 5 units 3 times daily AC with sliding scale until can restart insulin  pump  Home Health: No Equipment/Devices: Glucometer  Discharge Condition: Stable CODE STATUS: Full code Diet recommendation: Consistent carbohydrate diet  History of present illness:  Breanna Pratt is a 68 y.o. female with past medical history significant for DM1 on insulin  pump, HTN, migraine headache, osteoarthritis, glaucoma, chronic back pain, history of breast cancer s/p radiation, B12 deficiency who presented to [facility] ED on 04/18/2024 for elevated glucose.  Reportedly blood sugar has been in the 560s all day, normally around 150.  Additionally reports dry mouth, headache and generalized weakness.  Reports compliance with her insulin  regimen via insulin  pump, and diet.  Has traveled to [place] from [place]  for her job.  Follows with endocrinology outpatient.   In the ED, temperature 98.3 F, HR 82, RR 18, BP 136/57, SpO2 100% on room air.  VBG with pH 7.27, pCO2 37.5, PaO2 50.  WBC 19.8, hemoglobin 12.1, platelet count 243.  Sodium 136, potassium 5.1, chloride 97, CO2 15, glucose 527, BUN 22, creatinine 1.04.  Urinalysis with greater than 80 ketones, greater than 1000 glucose, negative nitrite, small leukocytes, no bacteria, 0-5 WBCs.  Chest x-ray with no acute cardiopulmonary disease process.  Patient was given 1 L LR fluid bolus, started on insulin  drip for DKA.  TRH consulted for admission and patient was transferred to  [facility] for further evaluation and management.  Hospital course:  Diabetic ketoacidosis Type 1 diabetes mellitus Patient presenting with 2-day history of dry mouth, headache, generalized weakness with blood sugar readings in the 560s.  On insulin  pump and Janumet at baseline, reports that her glucose is typically well-controlled in the 150s and adheres to a controlled carbohydrate diet.  Unclear etiology of her hyperglycemia, consideration for pump malfunction or empty reservoir.  Glucose on admission 527 with anion gap 24 and greater than 80 ketones on urinalysis.  Patient was initially started on insulin  drip and transition to subcutaneous insulin  once anion gap closed.  Seen by diabetic educator while hospitalized.  Patient did not have all of her supplies and there is concern for pump malfunction so patient will continue Lantus  15 units subcutaneously daily, Humalog 5 units 3 times daily AC with sliding scale at time of discharge.  Patient will make appoint with her endocrinologist and message her manufacture of her insulin  pump to ensure that it is operable.  Discussed with patient recommend continuing Lantus  until follows up with oncology for further guidance regarding restarting her insulin  pump.   Leukocytosis: Resolved WBC count elevated 19.8 on admission, no infectious etiology; likely reactive versus hemoconcentration due to dehydration with DKA as above.  WBC count improved to 6.1 at time of discharge.   Hyperkalemia, mild: Resolved Etiology likely secondary to DKA.   HTN Currently not on antihypertensives outpatient.  BP 132/46, controlled   Migraine headache Osteoarthritis Chronic back pain Stable  Discharge Diagnoses:  Principal Problem:   DKA (diabetic ketoacidosis) [facility])    Discharge Instructions  Discharge Instructions     Call MD for:  difficulty breathing, headache  or visual disturbances   Complete by: As directed    Call MD for:  extreme fatigue    Complete by: As directed    Call MD for:  persistant dizziness or light-headedness   Complete by: As directed    Call MD for:  persistant nausea and vomiting   Complete by: As directed    Call MD for:  severe uncontrolled pain   Complete by: As directed    Call MD for:  temperature >100.4   Complete by: As directed    Diet - low sodium heart healthy   Complete by: As directed    Increase activity slowly   Complete by: As directed       Allergies as of 04/20/2024       Reactions   Ramipril Swelling, Other (See Comments)   Angioedema    Ace Inhibitors Swelling   Meloxicam Other (See Comments)   Ineffective        Medication List     PAUSE taking these medications    HumaLOG 100 UNIT/ML injection Wait to take this until your doctor or other care provider tells you to start again. Generic drug: insulin  lispro Inject 100 Units into the skin See admin instructions. Place into Omnipod every 3 days You also have another medication with the same name that you may need to continue taking.   Omnipod 5 DexG7G6 Pods Gen 5 Misc Wait to take this until your doctor or other care provider tells you to start again. Inject 1 Device into the skin every 3 (three) days.       TAKE these medications    anastrozole  1 MG tablet Commonly known as: ARIMIDEX  Take 1 tablet (1 mg total) by mouth daily.   Blood Glucose Monitoring Suppl Devi 1 each by Does not apply route 3 (three) times daily. May dispense any manufacturer covered by patient's insurance.   cholecalciferol 25 MCG (1000 UNIT) tablet Commonly known as: VITAMIN D3 Take 1,000 Units by mouth daily.   clobetasol cream 0.05 % Commonly known as: TEMOVATE Apply 1 Application topically 2 (two) times daily as needed (for inflammation- avoid face/groin/underarms).   Dexcom G6 Sensor Misc Inject 1 Device into the skin See admin instructions. Place 1 new sensor into the skin every 10 days   Dexcom G6 Transmitter Misc See admin  instructions.   diclofenac 75 MG EC tablet Commonly known as: VOLTAREN Take 75 mg by mouth See admin instructions. Take 75 mg by mouth in the morning and an additional 75 mg once day as needed for pain   fluticasone 0.05 % cream Commonly known as: CUTIVATE Apply 1 Application topically 2 (two) times daily as needed (for redness, itching, or swelling).   insulin  lispro 100 UNIT/ML KwikPen Commonly known as: HUMALOG Inject 5 Units into the skin 3 (three) times daily with meals. If eating and Blood Glucose (BG) 80 or higher inject 5 units for meal coverage and add correction dose per scale. If not eating, correction dose only. BG <150= 0 unit; BG 150-200= 1 unit; BG 201-250= 3 unit; BG 251-300= 5 unit; BG 301-350= 7 unit; BG 351-400= 9 unit; BG >400= 11 unit and Call Primary Care. What changed: Another medication with the same name was paused. Ask your nurse or doctor if you should take this medication.   insulin  glargine 100 UNIT/ML Solostar Pen Commonly known as: LANTUS  Inject 15 Units into the skin daily. May substitute as needed per insurance.   Lancet Device Misc 1 each  by Does not apply route 3 (three) times daily. May dispense any manufacturer covered by patient's insurance.   Lancets Misc 1 each by Does not apply route 3 (three) times daily. Use as directed to check blood sugar. May dispense any manufacturer covered by patient's insurance and fits patient's device.   latanoprost  0.005 % ophthalmic solution Commonly known as: XALATAN  1 drop See admin instructions. Place 1 drop into both eyes at 5 PM   ONE TOUCH ULTRA TEST test strip Generic drug: glucose blood What changed: Another medication with the same name was added. Make sure you understand how and when to take each.   BLOOD GLUCOSE TEST STRIPS Strp 1 each by Does not apply route 3 (three) times daily. Use as directed to check blood sugar. May dispense any manufacturer covered by patient's insurance and fits patient's  device. What changed: You were already taking a medication with the same name, and this prescription was added. Make sure you understand how and when to take each.   Pen Needles 31G X 5 MM Misc 1 each by Does not apply route 3 (three) times daily. May dispense any manufacturer covered by patient's insurance. What changed:  medication strength how much to take how to take this when to take this additional instructions   pregabalin  100 MG capsule Commonly known as: LYRICA  Take 100 mg by mouth See admin instructions. Take 100 mg by mouth in the morning, afternoon, and at bedtime   sitaGLIPtin-metformin  50-1000 MG tablet Commonly known as: JANUMET Take 1 tablet by mouth daily.   vitamin B-12 100 MCG tablet Commonly known as: CYANOCOBALAMIN  Take 100 mcg by mouth daily.   Voltaren 1 % Gel Generic drug: diclofenac Sodium Apply 2 g topically 4 (four) times daily as needed (for pain- affected sites).        Follow-up Information     Marks, Addie B, NP. Schedule an appointment as soon as possible for a visit in 1 week(s).   Specialty: Nurse Practitioner Contact information: [address] [place] [address] [phone]                Allergies  Allergen Reactions   Ramipril Swelling and Other (See Comments)    Angioedema    Ace Inhibitors Swelling   Meloxicam Other (See Comments)    Ineffective    Consultations: None   Procedures/Studies: DG Chest Portable 1 View Result Date: 04/18/2024 CLINICAL DATA:  weakness EXAM: PORTABLE CHEST - 1 VIEW COMPARISON:  03/11/2021 FINDINGS: Low lung volumes with bronchovascular crowding. No focal airspace consolidation, pleural effusion, or pneumothorax. The cardiac silhouette is at the upper limits of normal, likely accentuated by AP technique and low lung volumes.No acute fracture or destructive lesion. IMPRESSION: No acute cardiopulmonary abnormality. Electronically Signed   By: Rogelia Myers M.D.   On: 04/18/2024 17:13      Subjective: Patient seen examined bedside, lying in bed.  RN present at bedside.  No complaints this morning.  Stable on subcutaneous insulin .  Discussed with patient need to hold on restarting her insulin  pump until ensure that it is operable per the manufacturer and to follow-up with endocrinology before restarting.  Patient agreeable.  No other questions, concerns or complaints at this time.  Denies headache, no dizziness, no chest pain, no palpitations, no shortness of breath, no abdominal pain, no fever/chills/night sweats, no nausea/vomiting/diarrhea, no focal weakness, no fatigue, no paresthesias.  No acute events overnight per nursing staff.  Discharge Exam: Vitals:   04/20/24 0700 04/20/24  0800  BP: (!) 125/43 (!) 131/51  Pulse: (!) 50 (!) 57  Resp: 12 13  Temp:    SpO2: 98% 100%   Vitals:   04/20/24 0600 04/20/24 0625 04/20/24 0700 04/20/24 0800  BP: (!) 132/46  (!) 125/43 (!) 131/51  Pulse: (!) 47  (!) 50 (!) 57  Resp: 11  12 13   Temp:  97.6 F (36.4 C)    TempSrc:  Oral    SpO2: 98%  98% 100%  Weight:      Height:        Physical Exam: GEN: NAD, alert and oriented x 3, wd/wn HEENT: NCAT, PERRL, EOMI, sclera clear, MMM PULM: CTAB w/o wheezes/crackles, normal respiratory effort, on room air CV: RRR w/o M/G/R GI: abd soft, NTND, + BS MSK: no peripheral edema, muscle strength globally intact 5/5 bilateral upper/lower extremities NEURO: CN II-XII intact, no focal deficits, sensation to light touch intact PSYCH: normal mood/affect Integumentary: dry/intact, no rashes or wounds    The results of significant diagnostics from this hospitalization (including imaging, microbiology, ancillary and laboratory) are listed below for reference.     Microbiology: Recent Results (from the past 240 hours)  MRSA Next Gen by PCR, Nasal     Status: None   Collection Time: 04/18/24  6:28 PM   Specimen: Nasal Mucosa; Nasal Swab  Result Value Ref Range Status   MRSA by PCR  Next Gen NOT DETECTED NOT DETECTED Final    Comment: (NOTE) The GeneXpert MRSA Assay (FDA approved for NASAL specimens only), is one component of a comprehensive MRSA colonization surveillance program. It is not intended to diagnose MRSA infection nor to guide or monitor treatment for MRSA infections. Test performance is not FDA approved in patients less than 75 years old. Performed at [facility], [address] [place], [place] [address]      Labs: BNP (last 3 results) No results for input(s): BNP in the last 8760 hours. Basic Metabolic Panel: Recent Labs  Lab 04/18/24 1929 04/18/24 2248 04/19/24 0310 04/19/24 0647 04/19/24 1052 04/20/24 0335  NA 138 137 138 138 141 139  K 4.5 4.4 4.0 4.0 4.3 3.8  CL 103 106 108 108 109 107  CO2 15* 17* 19* 19* 21* 21*  GLUCOSE 223* 165* 178* 182* 178* 244*  BUN 21 18 14 12 10  7*  CREATININE 0.94 0.84 0.79 0.72 0.75 0.71  CALCIUM 9.1 8.7* 8.4* 8.5* 8.7* 8.5*  MG 2.1  --   --   --   --   --   PHOS 3.7  --   --   --   --   --    Liver Function Tests: Recent Labs  Lab 04/18/24 1432  AST 18  ALT 14  ALKPHOS 106  BILITOT 1.4*  PROT 8.1  ALBUMIN 4.6   No results for input(s): LIPASE, AMYLASE in the last 168 hours. No results for input(s): AMMONIA in the last 168 hours. CBC: Recent Labs  Lab 04/18/24 1432 04/18/24 1458 04/19/24 0310 04/20/24 0335  WBC 19.8*  --  12.4* 6.1  HGB 12.1 13.6 10.7* 10.9*  HCT 35.1* 40.0 33.5* 35.2*  MCV 102.6*  --  107.4* 107.3*  PLT 243  --  243 220   Cardiac Enzymes: No results for input(s): CKTOTAL, CKMB, CKMBINDEX, TROPONINI in the last 168 hours. BNP: Invalid input(s): POCBNP CBG: Recent Labs  Lab 04/19/24 1400 04/19/24 1457 04/19/24 1619 04/19/24 2210 04/20/24 0805  GLUCAP 197* 189* 156* 241* 289*  D-Dimer No results for input(s): DDIMER in the last 72 hours. Hgb A1c Recent Labs    04/19/24 0310  HGBA1C 6.9*   Lipid Profile No  results for input(s): CHOL, HDL, LDLCALC, TRIG, CHOLHDL, LDLDIRECT in the last 72 hours. Thyroid  function studies No results for input(s): TSH, T4TOTAL, T3FREE, THYROIDAB in the last 72 hours.  Invalid input(s): FREET3 Anemia work up No results for input(s): VITAMINB12, FOLATE, FERRITIN, TIBC, IRON, RETICCTPCT in the last 72 hours. Urinalysis    Component Value Date/Time   COLORURINE COLORLESS (A) 04/18/2024 1345   APPEARANCEUR CLEAR 04/18/2024 1345   LABSPEC 1.028 04/18/2024 1345   PHURINE 5.0 04/18/2024 1345   GLUCOSEU >1,000 (A) 04/18/2024 1345   HGBUR NEGATIVE 04/18/2024 1345   BILIRUBINUR NEGATIVE 04/18/2024 1345   KETONESUR >80 (A) 04/18/2024 1345   PROTEINUR NEGATIVE 04/18/2024 1345   UROBILINOGEN 0.2 10/15/2011 0255   NITRITE NEGATIVE 04/18/2024 1345   LEUKOCYTESUR SMALL (A) 04/18/2024 1345   Sepsis Labs Recent Labs  Lab 04/18/24 1432 04/19/24 0310 04/20/24 0335  WBC 19.8* 12.4* 6.1   Microbiology Recent Results (from the past 240 hours)  MRSA Next Gen by PCR, Nasal     Status: None   Collection Time: 04/18/24  6:28 PM   Specimen: Nasal Mucosa; Nasal Swab  Result Value Ref Range Status   MRSA by PCR Next Gen NOT DETECTED NOT DETECTED Final    Comment: (NOTE) The GeneXpert MRSA Assay (FDA approved for NASAL specimens only), is one component of a comprehensive MRSA colonization surveillance program. It is not intended to diagnose MRSA infection nor to guide or monitor treatment for MRSA infections. Test performance is not FDA approved in patients less than 1 years old. Performed at [facility], [address] [place], [place] [address]      Time coordinating discharge: Over 30 minutes  SIGNED:   Camellia PARAS [place], DO  [facility] 04/20/2024, 11:44 AM

## 2024-04-20 NOTE — Inpatient Diabetes Management (Signed)
 Inpatient Diabetes Program Recommendations  AACE/ADA: New Consensus Statement on Inpatient Glycemic Control (2015)  Target Ranges:  Prepandial:   less than 140 mg/dL      Peak postprandial:   less than 180 mg/dL (1-2 hours)      Critically ill patients:  140 - 180 mg/dL   Lab Results  Component Value Date   GLUCAP 289 (H) 04/20/2024   HGBA1C 6.9 (H) 04/19/2024    Review of Glycemic Control  Diabetes history: DM1 Outpatient Diabetes medications: OmniPod with Dexcom 6 Current orders for Inpatient glycemic control: Lantus  15 daily, Novolog  0-9 TID with meals and 0-5 HS + 5 units TID  HgbA1C - 6.9%  Inpatient Diabetes Program Recommendations:    Agree with orders.  Spoke with pt at bedside regarding calling insulin  pump company prior to restarting the pump. Reminded pt to wait 24 hours after last basal (Lantus ) insulin  was given. Pt states she will use Lantus  and Novolog  until she sees Endocrinologist to troubleshoot the insulin  pump.   Reviewed hypoglycemia s/s and treatment. Answered all questions. Pt will use Dexcom while giving herself SQ insulin .   Pt appreciative of visit. Discussed above with RN.  Thank you. Shona Brandy, RD, LDN, CDCES Inpatient Diabetes Coordinator [phone]

## 2024-07-03 ENCOUNTER — Other Ambulatory Visit (HOSPITAL_COMMUNITY): Payer: Self-pay
